# Patient Record
Sex: Female | Born: 1960
Health system: Southern US, Community
[De-identification: ages and names within clinical notes are randomized; demographics above are authoritative.]

## PROBLEM LIST (undated history)

## (undated) DIAGNOSIS — M8580 Other specified disorders of bone density and structure, unspecified site: Secondary | ICD-10-CM

## (undated) DIAGNOSIS — G2581 Restless legs syndrome: Secondary | ICD-10-CM

## (undated) DIAGNOSIS — E119 Type 2 diabetes mellitus without complications: Secondary | ICD-10-CM

## (undated) DIAGNOSIS — G7100 Muscular dystrophy, unspecified: Secondary | ICD-10-CM

## (undated) DIAGNOSIS — E785 Hyperlipidemia, unspecified: Secondary | ICD-10-CM

## (undated) DIAGNOSIS — G7109 Other specified muscular dystrophies: Secondary | ICD-10-CM

## (undated) DIAGNOSIS — A630 Anogenital (venereal) warts: Secondary | ICD-10-CM

## (undated) DIAGNOSIS — I10 Essential (primary) hypertension: Secondary | ICD-10-CM

## (undated) DIAGNOSIS — Z720 Tobacco use: Secondary | ICD-10-CM

## (undated) DIAGNOSIS — N390 Urinary tract infection, site not specified: Secondary | ICD-10-CM

## (undated) DIAGNOSIS — K219 Gastro-esophageal reflux disease without esophagitis: Secondary | ICD-10-CM

## (undated) DIAGNOSIS — J40 Bronchitis, not specified as acute or chronic: Secondary | ICD-10-CM

## (undated) DIAGNOSIS — G71039 Limb girdle muscular dystrophy, unspecified: Secondary | ICD-10-CM

## (undated) DIAGNOSIS — E039 Hypothyroidism, unspecified: Secondary | ICD-10-CM

## (undated) DIAGNOSIS — R519 Headache, unspecified: Secondary | ICD-10-CM

## (undated) DIAGNOSIS — N950 Postmenopausal bleeding: Secondary | ICD-10-CM

## (undated) DIAGNOSIS — Z780 Asymptomatic menopausal state: Secondary | ICD-10-CM

## (undated) DIAGNOSIS — M1990 Unspecified osteoarthritis, unspecified site: Secondary | ICD-10-CM

## (undated) DIAGNOSIS — E079 Disorder of thyroid, unspecified: Secondary | ICD-10-CM

## (undated) HISTORY — PX: DILATION AND CURETTAGE OF UTERUS: SHX78

## (undated) HISTORY — DX: Muscular dystrophy, unspecified: G71.00

## (undated) HISTORY — DX: Other specified disorders of bone density and structure, unspecified site: M85.80

## (undated) HISTORY — DX: Urinary tract infection, site not specified: N39.0

## (undated) HISTORY — DX: Tobacco use: Z72.0

## (undated) HISTORY — DX: Disorder of thyroid, unspecified: E07.9

## (undated) HISTORY — DX: Hyperlipidemia, unspecified: E78.5

## (undated) HISTORY — DX: Limb girdle muscular dystrophy, unspecified: G71.039

## (undated) HISTORY — DX: Essential (primary) hypertension: I10

## (undated) HISTORY — DX: Type 2 diabetes mellitus without complications: E11.9

## (undated) HISTORY — DX: Asymptomatic menopausal state: Z78.0

## (undated) HISTORY — DX: Unspecified osteoarthritis, unspecified site: M19.90

## (undated) HISTORY — PX: NO PAST SURGERIES: SHX2092

## (undated) HISTORY — DX: Other specified muscular dystrophies: G71.09

## (undated) HISTORY — DX: Anogenital (venereal) warts: A63.0

---

## 2004-04-15 ENCOUNTER — Ambulatory Visit: Payer: Self-pay | Admitting: Internal Medicine

## 2004-05-27 ENCOUNTER — Ambulatory Visit: Payer: Self-pay | Admitting: Internal Medicine

## 2005-07-22 ENCOUNTER — Ambulatory Visit: Payer: Self-pay | Admitting: Internal Medicine

## 2005-10-15 ENCOUNTER — Ambulatory Visit: Payer: Self-pay

## 2006-06-17 ENCOUNTER — Ambulatory Visit: Payer: Self-pay | Admitting: Internal Medicine

## 2009-06-10 ENCOUNTER — Ambulatory Visit: Payer: Self-pay | Admitting: Neurology

## 2009-06-20 ENCOUNTER — Encounter: Payer: Self-pay | Admitting: Neurology

## 2009-06-25 ENCOUNTER — Encounter: Payer: Self-pay | Admitting: Neurology

## 2009-10-31 ENCOUNTER — Ambulatory Visit: Payer: Self-pay | Admitting: Orthopedic Surgery

## 2014-02-27 ENCOUNTER — Ambulatory Visit: Payer: Self-pay | Admitting: Gastroenterology

## 2014-03-01 LAB — PATHOLOGY REPORT

## 2014-05-31 DIAGNOSIS — E119 Type 2 diabetes mellitus without complications: Secondary | ICD-10-CM | POA: Insufficient documentation

## 2014-05-31 DIAGNOSIS — G71039 Limb girdle muscular dystrophy, unspecified: Secondary | ICD-10-CM | POA: Insufficient documentation

## 2014-05-31 DIAGNOSIS — G7109 Other specified muscular dystrophies: Secondary | ICD-10-CM | POA: Insufficient documentation

## 2014-06-25 ENCOUNTER — Ambulatory Visit: Payer: Self-pay | Admitting: Gastroenterology

## 2014-07-19 ENCOUNTER — Emergency Department: Payer: Self-pay | Admitting: Emergency Medicine

## 2014-09-17 LAB — SURGICAL PATHOLOGY

## 2015-03-14 DIAGNOSIS — R1310 Dysphagia, unspecified: Secondary | ICD-10-CM | POA: Insufficient documentation

## 2015-03-21 ENCOUNTER — Other Ambulatory Visit: Payer: Self-pay | Admitting: Neurology

## 2015-03-21 DIAGNOSIS — G71039 Limb girdle muscular dystrophy, unspecified: Secondary | ICD-10-CM

## 2015-03-21 DIAGNOSIS — G7109 Other specified muscular dystrophies: Secondary | ICD-10-CM

## 2015-03-25 ENCOUNTER — Ambulatory Visit
Admission: RE | Admit: 2015-03-25 | Discharge: 2015-03-25 | Disposition: A | Discharge status: Home / Self Care | Payer: BLUE CROSS/BLUE SHIELD | Source: Ambulatory Visit | Attending: Neurology | Admitting: Neurology

## 2015-03-25 DIAGNOSIS — G7109 Other specified muscular dystrophies: Secondary | ICD-10-CM

## 2015-03-25 DIAGNOSIS — R1312 Dysphagia, oropharyngeal phase: Secondary | ICD-10-CM | POA: Diagnosis present

## 2015-03-25 DIAGNOSIS — G71039 Limb girdle muscular dystrophy, unspecified: Secondary | ICD-10-CM

## 2015-03-25 NOTE — Therapy (Signed)
[place], [place], [address] Phone: [phone]   Fax:     Modified Barium Swallow  Patient Details  Name: Andrea Rogers MRN: [id number] Date of Birth: 02/24/1961 Referring Provider: Gurney Maxin, MD  Encounter Date: 03/25/2015      End of Session - 03/25/15 1257    Visit Number 1   Number of Visits 1   Date for SLP Re-Evaluation 03/25/15   SLP Start Time 8921   SLP Stop Time  1941   SLP Time Calculation (min) 43 min   Activity Tolerance Patient tolerated treatment well      No past medical history on file.  No past surgical history on file.  There were no vitals filed for this visit.  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  She is able to convey her swallowing history and follow directions.  Chief complaint: Limb-girdle muscular dystrophy.  Currently reports difficulty swallowing solids.   Objective:  Radiological Procedure: A videoflouroscopic evaluation of oral-preparatory, reflex initiation, and pharyngeal phases of the swallow was performed; as well as a screening of the upper esophageal phase.  I. POSTURE: Upright in MBS chair  II. VIEW: Lateral  III. COMPENSATORY STRATEGIES: N/A  IV. BOLUSES ADMINISTERED:   Thin Liquid: 2 cup rim sips   Nectar-thick Liquid: 2 cup rim sips   Puree: 2 teaspoon boluses   Mechanical Soft: 1/4 graham cracker in applesauce  V. RESULTS OF EVALUATION: A. ORAL PREPARATORY PHASE: (The lips, tongue, and velum are observed for strength and coordination) Within normal limits       **Overall Severity Rating:  WNL  B. SWALLOW INITIATION/REFLEX: (The reflex is normal if "triggered" by the time the bolus reached the base of the tongue) Triggers while falling from the vallecular space to pyriform sinuses  **Overall Severity Rating: Mild  C. PHARYNGEAL PHASE: (Pharyngeal function is normal if the bolus shows rapid, smooth, and continuous transit through the pharynx and there is no pharyngeal residue after the swallow) Decreased tongue base retraction and hyolaryngeal movement with trace pharyngeal residue post swallow.  **Overall Severity Rating: Minimal  D. LARYNGEAL PENETRATION: (Material entering into the laryngeal inlet/vestibule but not aspirated) None  E. ASPIRATION: None  F. ESOPHAGEAL PHASE: (Screening of the upper esophagus) Mid-esophagus retention with retrograde movement below the level of the PES  ASSESSMENT: This 54 year old woman, with limb-girdle muscular dystrophy and complaints of difficulty swallowing food, is presenting with minimal oropharyngeal dysphagia characterized by delayed pharyngeal swallow initiation and decreased pharyngeal pressure generation (resulting in trace pharyngeal residue).  Oral control of the bolus including oral hold, rotary mastication, and anterior to posterior transfer is within normal limits.  There was no observed pharyngeal residue, laryngeal penetration, or tracheal aspiration.   The patient does not present with risk for prandial aspiration.  An esophageal sweep in the upright position with solid and liquid consistencies showed mid- and distal-esophageal retention with retrograde movement below the level of the PES. This study is consistent with the effects of laryngopharyngeal reflux (inflammation, edema, and resultant decreased sensation of the larynx and pharynx).  The patient was counseled to follow basic swallowing precautions including  small bites/sips, sit upright for all POs, alternate solids and liquids, and to pause in eating if she feels that her  esophagus is backing up. The patient may benefit from GI consult to determine if treatment would reduce sypmtoms.    PLAN/RECOMMENDATIONS:  A. Diet: Usual diet   B. Swallowing Precautions: small bites/sips, sit upright for all POs, alternate solids and liquids, and to pause in eating if she feels that her esophagus is backing up   C. Recommended consultation to: GI consult   D. Therapy recommendations: N/A   E. Results and recommendations were discussed with the patient immediately following the study and the final report will be routed to the referring MD      Problem List There are no active problems to display for this patient.  Leroy Sea, [place], Susie 03/25/2015, 12:59 PM  [facility] [place], [place], [address] Phone: [phone]   Fax:     Name: Andrea Rogers MRN: [id number] Date of Birth: 03-20-1961

## 2015-03-26 DIAGNOSIS — I10 Essential (primary) hypertension: Secondary | ICD-10-CM | POA: Insufficient documentation

## 2015-03-26 DIAGNOSIS — R002 Palpitations: Secondary | ICD-10-CM | POA: Insufficient documentation

## 2015-03-26 DIAGNOSIS — R600 Localized edema: Secondary | ICD-10-CM | POA: Insufficient documentation

## 2015-04-09 ENCOUNTER — Ambulatory Visit: Payer: BLUE CROSS/BLUE SHIELD | Attending: Neurology

## 2015-04-09 DIAGNOSIS — R2681 Unsteadiness on feet: Secondary | ICD-10-CM | POA: Insufficient documentation

## 2015-04-09 DIAGNOSIS — R531 Weakness: Secondary | ICD-10-CM | POA: Diagnosis present

## 2015-04-10 NOTE — Therapy (Addendum)
[place] MAIN [facility] SERVICES [address] [place], [place], [address] Phone: [phone]   Fax:  [phone]  Physical Therapy Evaluation  Patient Details  Name: Andrea Rogers MRN: [id number] Date of Birth: 06/18/1960 Referring Provider: Gurney Maxin  Encounter Date: 04/09/2015    Past Medical History  Diagnosis Date  . Muscular dystrophy, limb girdle ([place])     History reviewed. No pertinent past surgical history.  There were no vitals filed for this visit.  Visit Diagnosis:  Unsteadiness on feet - Plan: PT plan of care cert/re-cert  Weakness - Plan: PT plan of care cert/re-cert      Subjective Assessment - 04/11/15 0830    Subjective Pt reports she was referred to PT for an AFO fitting for her L LE.  pt has had three falls in the past six months and has had other falls prior to six months.  Pt is not sure of the mechanism of her falls due to sometimes falling while descending small steps, tripping over her "toes" and from static positions.  Pt has noticed she gotten weaker in the past few years and now avoids stairs.  Pt notes she does not drag her feet while ambulating nor does she require an assistive device for mobility.  Pt currently has 3/10 pain in her knees with R>L.  pt has noticed her balance is impaired.     Currently in Pain? Yes   Pain Score 3    Pain Location Knee   Pain Orientation Right;Left            [facility] PT Assessment - 04/11/15 0001    Assessment   Medical Diagnosis Muscular dystrophy   Onset Date/Surgical Date 10/12/14   Hand Dominance Right   Next MD Visit 04/29/15   Prior Therapy PT   Precautions   Precautions Fall   Restrictions   Weight Bearing Restrictions No   [place] residence   Living Arrangements Spouse/significant other   Available Help at Discharge Family   Type of [place] to enter   Entrance Stairs-Number of Steps 2   Entrance Stairs-Rails Right   [place] One level   [place] bars - toilet;Toilet riser   Prior Function   Level of Independence Independent   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 for tasks assessed   Sensation   Light Touch Appears Intact   Coordination   Gross Motor Movements are Fluid and Coordinated Yes        PAIN: 3/10 in bilateral knees  POSTURE: Upright   AROM: Pt demonstrates functional bilateral functional AROM STRENGTH:  Graded on a 0-5 scale Muscle Group Left Right  Elbow    Wrist/hand    Hip Flex 3+ 3+  Hip abduction     Hip IR/ER    Knee Flex 4+ 4+  Knee Ext 4- 4-  Ankle DF 4+ 4  Ankle dorsiflexion/eversoin  4+ 4+   SENSATION: Bilateral LEs are intact to light touch   LE myotomes/dermatomes: WNL   FUNCTIONAL MOBILITY: Pt requires UEs to perform sit to stand transfers, and her knees exhibit increase knee valgus indicating weak hip abductors   GAIT: Pt is able to ambulate around the gym without any LOB or foot drop.  She has reciprocal gait pattern and clears her feet bilaterally  OUTCOME MEASURES: TEST Outcome Interpretation  5 times sit<>stand Unable to  perform withou UE assist >50 yo, >15 sec indicates increased risk for falls  10 meter walk test              1.05   m/s <1.0 m/s indicates increased risk for falls; limited community ambulator          Berg Balance Assessment 48/56 <36/56 (100% risk for falls), 37-45 (80% risk for falls); 46-51 (>50% risk for falls); 52-55 (lower risk <25% of falls)                                 PT Long Term Goals - 04/10/15 1854    PT LONG TERM GOAL #1   Title pt expressess understanding of not requiring an AFO at this time to decrease her fall risk.     Baseline --   Time 1   Period Days   Status New               Plan - 04/10/15 1853    Clinical Impression Statement pt is a pleasant 54 year old female with limb  girdle muscular dystrophy.  Pt was referred for strength/balance training and for a AFO fitting due to prevent/decrease falls.  Based on patient's specific fall history anke strength, gait assesment and outcome measures, pt would not benefit from an AFO at this time.  Pt is able to ambulate without drop foot and can clear her feet bilaterally when ambulating.  Her falls are more likely due to due decreased and general LE weakness versus decreased ankle dorsiflexion when ambulating.  Pt demonstrates significant LE strength deficit, general balance deficits. pt is of high fall risk at this time and would benefit from skilled PT services to improve upon impairments and reduce fall risk to maximize functional mobility.  Pt refused physical therapy recommendations for follow up  due to reported time constraints  and also verbalized understanding that an AFO would likely not benefit her at this time. pt encouraged to call with any questions or concers. pt will be DC after todays visit per her request.    Pt will benefit from skilled therapeutic intervention in order to improve on the following deficits Decreased strength;Decreased balance   Rehab Potential Fair   PT Frequency 2x / week   PT Duration 4 weeks   PT Treatment/Interventions Aquatic Therapy;Therapeutic exercise;Therapeutic activities;Functional mobility training;Stair training;Balance training;Neuromuscular re-education;Manual techniques;Energy conservation         Problem List There are no active problems to display for this patient.  Renford Dills, SPT This entire session was performed under direct supervision and direction of a licensed Chiropractor . I have personally read, edited and approve of the note as written. Gorden Harms. Tortorici, PT, DPT [phone]  Tortorici,Ashley 04/11/2015, 8:30 AM  [place] MAIN [facility] SERVICES [address] [place], [place], [address] Phone: [phone]    Fax:  [phone]  Name: Andrea Rogers MRN: [id number] Date of Birth: [birth year]

## 2015-08-19 ENCOUNTER — Other Ambulatory Visit: Payer: Self-pay | Admitting: Obstetrics and Gynecology

## 2015-10-02 DIAGNOSIS — E039 Hypothyroidism, unspecified: Secondary | ICD-10-CM | POA: Insufficient documentation

## 2016-03-09 DIAGNOSIS — F411 Generalized anxiety disorder: Secondary | ICD-10-CM | POA: Insufficient documentation

## 2016-03-23 DIAGNOSIS — Z720 Tobacco use: Secondary | ICD-10-CM | POA: Insufficient documentation

## 2016-03-23 NOTE — Progress Notes (Signed)
ANNUAL PREVENTATIVE CARE GYN  ENCOUNTER NOTE  Subjective:       Andrea Rogers is a 55 y.o. G0P0000 female here for a routine annual gynecologic exam.  Current complaints: None  Mom, age 55, recently diagnosed with breast cancer. 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 Bowel and bladder function are normal. Patient continues to smoke; does not desire to quit   Gynecologic History No LMP recorded. Contraception: post menopausal status Last Pap: 07/2013 neg/neg. Results were: normal Last mammogram: 2015- [facility]. Results were: normal  Obstetric History OB History  Gravida Para Term Preterm AB Living  0 0 0 0 0 0  SAB TAB Ectopic Multiple Live Births  0 0 0 0 0        Past Medical History:  Diagnosis Date  . Anal condyloma   . Arthritis   . Diabetes mellitus without complication ([place])   . HLD (hyperlipidemia)   . Hypertension   . MD (muscular dystrophy) ([place])   . Menopause   . Muscular dystrophy, limb girdle ([place])   . Osteopenia   . Thyroid disease   . Tobacco abuse     Past Surgical History:  Procedure Laterality Date  . NO PAST SURGERIES      Current Outpatient Prescriptions on File Prior to Visit  Medication Sig Dispense Refill  . HYDROcodone-acetaminophen (NORCO) 7.5-325 MG tablet Take 1 tablet by mouth every 6 (six) hours as needed for moderate pain.     No current facility-administered medications on file prior to visit.     Allergies  Allergen Reactions  . Latex   . Penicillins Other (See Comments)    Yeast infection    Social History   Social History  . Marital status: Married    Spouse name: N/A  . Number of children: N/A  . Years of education: N/A   Occupational History  . Not on file.   Social History Main Topics  . Smoking status: Current Every Day Smoker    Packs/day: 1.00  . Smokeless tobacco: Never Used  . Alcohol use No  . Drug use: No  . Sexual activity: Yes     Birth control/ protection: Post-menopausal   Other Topics Concern  . Not on file   Social History Narrative  . No narrative on file    Family History  Problem Relation Age of Onset  . Adopted: Yes  . Family history unknown: Yes    The following portions of the patient's history were reviewed and updated as appropriate: allergies, current medications, past family history, past medical history, past social history, past surgical history and problem list.  Review of Systems ROS Review of Systems - General ROS: negative for - chills, fatigue, fever, hot flashes, night sweats, weight gain or weight loss Psychological ROS: negative for - anxiety, decreased libido, depression, mood swings, physical abuse or sexual abuse Ophthalmic ROS: negative for - blurry vision, eye pain or loss of vision ENT ROS: negative for - headaches, hearing change, visual changes or vocal changes Allergy and Immunology ROS: negative for - hives, itchy/watery eyes or seasonal allergies Hematological and Lymphatic ROS: negative for - bleeding problems, bruising, swollen lymph nodes or weight loss Endocrine ROS: negative for - galactorrhea, hair pattern changes, hot flashes, malaise/lethargy, mood swings, palpitations, polydipsia/polyuria, skin changes, temperature intolerance or unexpected weight changes Breast ROS: negative for - new or changing breast lumps or nipple discharge Respiratory ROS: negative for - cough or shortness  of breath Cardiovascular ROS: negative for - chest pain, irregular heartbeat, palpitations or shortness of breath Gastrointestinal ROS: no abdominal pain, change in bowel habits, or black or bloody stools Genito-Urinary ROS: no dysuria, trouble voiding, or hematuria Musculoskeletal ROS: negative for - joint pain or joint stiffness Neurological ROS: negative for - bowel and bladder control changes Dermatological ROS: negative for rash and skin lesion changes   Objective:   BP 111/69    Pulse 85   Ht 5\' 4"  (1.626 m)   Wt 144 lb 4.8 oz (65.5 kg)   BMI 24.77 kg/m  CONSTITUTIONAL: Well-developed, well-nourished female in no acute distress.  PSYCHIATRIC: Normal mood and affect. Normal behavior. Normal judgment and thought content. [place]: Alert and oriented to person, place, and time. Normal muscle tone coordination. No cranial nerve deficit noted. HENT:  Normocephalic, atraumatic, External right and left ear normal. Oropharynx is clear and moist EYES: Conjunctivae and EOM are normal. Pupils are equal, round, and reactive to light. No scleral icterus.  NECK: Normal range of motion, supple, no masses.  Normal thyroid.  SKIN: Skin is warm and dry. No rash noted. Not diaphoretic. No erythema. No pallor. CARDIOVASCULAR: Normal heart rate noted, regular rhythm, no murmur. RESPIRATORY: Clear to auscultation bilaterally. Effort and breath sounds normal, no problems with respiration noted. BREASTS: Symmetric in size. No masses, skin changes, nipple drainage, or lymphadenopathy. ABDOMEN: Soft, normal bowel sounds, no distention noted.  No tenderness, rebound or guarding.  BLADDER: Normal PELVIC:  External Genitalia: Normal  BUS: Normal  Vagina: Normal  Cervix: Normal; no lesions  Uterus: Normal; midplane, normal size and shape, mobile, nontender  Adnexa: Normal; nonpalpable and nontender  RV: External Exam NormaI, No Rectal Masses and Normal Sphincter tone  MUSCULOSKELETAL: Normal range of motion. No tenderness.  No cyanosis, clubbing, or edema.  2+ distal pulses. LYMPHATIC: No Axillary, Supraclavicular, or Inguinal Adenopathy.    Assessment:   Annual gynecologic examination 55 y.o. Contraception: post menopausal status Normal BMI Problem List Items Addressed This Visit    Tobacco user    Other Visit Diagnoses    Well woman exam with routine gynecological exam    -  Primary   Screening for breast cancer       Screen for colon cancer       Osteopenia, unspecified  location          Plan:  Pap: due 2018 Mammogram: Ordered Stool Guaiac Testing:  Ordered Labs: pcp Routine preventative health maintenance measures emphasized: Exercise/Diet/Weight control, Tobacco Warnings and Alcohol/Substance use risks Return to [place], CMA  Brayton Mars, MD  Note: This dictation was prepared with Dragon dictation along with smaller phrase technology. Any transcriptional errors that result from this process are unintentional.

## 2016-03-25 ENCOUNTER — Encounter: Payer: Self-pay | Admitting: Obstetrics and Gynecology

## 2016-03-25 ENCOUNTER — Ambulatory Visit (INDEPENDENT_AMBULATORY_CARE_PROVIDER_SITE_OTHER): Payer: BLUE CROSS/BLUE SHIELD | Admitting: Obstetrics and Gynecology

## 2016-03-25 VITALS — BP 111/69 | HR 85 | Ht 64.0 in | Wt 144.3 lb

## 2016-03-25 DIAGNOSIS — Z1211 Encounter for screening for malignant neoplasm of colon: Secondary | ICD-10-CM | POA: Diagnosis not present

## 2016-03-25 DIAGNOSIS — Z1231 Encounter for screening mammogram for malignant neoplasm of breast: Secondary | ICD-10-CM | POA: Diagnosis not present

## 2016-03-25 DIAGNOSIS — Z01419 Encounter for gynecological examination (general) (routine) without abnormal findings: Secondary | ICD-10-CM

## 2016-03-25 DIAGNOSIS — Z720 Tobacco use: Secondary | ICD-10-CM

## 2016-03-25 DIAGNOSIS — M8580 Other specified disorders of bone density and structure, unspecified site: Secondary | ICD-10-CM | POA: Diagnosis not present

## 2016-03-25 DIAGNOSIS — Z1239 Encounter for other screening for malignant neoplasm of breast: Secondary | ICD-10-CM

## 2016-03-25 NOTE — Patient Instructions (Signed)
Health Maintenance, Female Adopting a healthy lifestyle and getting preventive care can go a long way to promote health and wellness. Talk with your health care provider about what schedule of regular examinations is right for you. This is a good chance for you to check in with your provider about disease prevention and staying healthy. In between checkups, there are plenty of things you can do on your own. Experts have done a lot of research about which lifestyle changes and preventive measures are most likely to keep you healthy. Ask your health care provider for more information. WEIGHT AND DIET  Eat a healthy diet  Be sure to include plenty of vegetables, fruits, low-fat dairy products, and lean protein.  Do not eat a lot of foods high in solid fats, added sugars, or salt.  Get regular exercise. This is one of the most important things you can do for your health.  Most adults should exercise for at least 150 minutes each week. The exercise should increase your heart rate and make you sweat (moderate-intensity exercise).  Most adults should also do strengthening exercises at least twice a week. This is in addition to the moderate-intensity exercise.  Maintain a healthy weight  Body mass index (BMI) is a measurement that can be used to identify possible weight problems. It estimates body fat based on height and weight. Your health care provider can help determine your BMI and help you achieve or maintain a healthy weight.  For females 20 years of age and older:   A BMI below 18.5 is considered underweight.  A BMI of 18.5 to 24.9 is normal.  A BMI of 25 to 29.9 is considered overweight.  A BMI of 30 and above is considered obese.  Watch levels of cholesterol and blood lipids  You should start having your blood tested for lipids and cholesterol at 55 years of age, then have this test every 5 years.  You may need to have your cholesterol levels checked more often if:  Your lipid  or cholesterol levels are high.  You are older than 55 years of age.  You are at high risk for heart disease.  CANCER SCREENING   Lung Cancer  Lung cancer screening is recommended for adults 55-80 years old who are at high risk for lung cancer because of a history of smoking.  A yearly low-dose CT scan of the lungs is recommended for people who:  Currently smoke.  Have quit within the past 15 years.  Have at least a 30-pack-year history of smoking. A pack year is smoking an average of one pack of cigarettes a day for 1 year.  Yearly screening should continue until it has been 15 years since you quit.  Yearly screening should stop if you develop a health problem that would prevent you from having lung cancer treatment.  Breast Cancer  Practice breast self-awareness. This means understanding how your breasts normally appear and feel.  It also means doing regular breast self-exams. Let your health care provider know about any changes, no matter how small.  If you are in your 20s or 30s, you should have a clinical breast exam (CBE) by a health care provider every 1-3 years as part of a regular health exam.  If you are 40 or older, have a CBE every year. Also consider having a breast X-ray (mammogram) every year.  If you have a family history of breast cancer, talk to your health care provider about genetic screening.  If you   are at high risk for breast cancer, talk to your health care provider about having an MRI and a mammogram every year.  Breast cancer gene (BRCA) assessment is recommended for women who have family members with BRCA-related cancers. BRCA-related cancers include:  Breast.  Ovarian.  Tubal.  Peritoneal cancers.  Results of the assessment will determine the need for genetic counseling and BRCA1 and BRCA2 testing. Cervical Cancer Your health care provider may recommend that you be screened regularly for cancer of the pelvic organs (ovaries, uterus, and  vagina). This screening involves a pelvic examination, including checking for microscopic changes to the surface of your cervix (Pap test). You may be encouraged to have this screening done every 3 years, beginning at age 21.  For women ages 30-65, health care providers may recommend pelvic exams and Pap testing every 3 years, or they may recommend the Pap and pelvic exam, combined with testing for human papilloma virus (HPV), every 5 years. Some types of HPV increase your risk of cervical cancer. Testing for HPV may also be done on women of any age with unclear Pap test results.  Other health care providers may not recommend any screening for nonpregnant women who are considered low risk for pelvic cancer and who do not have symptoms. Ask your health care provider if a screening pelvic exam is right for you.  If you have had past treatment for cervical cancer or a condition that could lead to cancer, you need Pap tests and screening for cancer for at least 20 years after your treatment. If Pap tests have been discontinued, your risk factors (such as having a new sexual partner) need to be reassessed to determine if screening should resume. Some women have medical problems that increase the chance of getting cervical cancer. In these cases, your health care provider may recommend more frequent screening and Pap tests. Colorectal Cancer  This type of cancer can be detected and often prevented.  Routine colorectal cancer screening usually begins at 55 years of age and continues through 55 years of age.  Your health care provider may recommend screening at an earlier age if you have risk factors for colon cancer.  Your health care provider may also recommend using home test kits to check for hidden blood in the stool.  A small camera at the end of a tube can be used to examine your colon directly (sigmoidoscopy or colonoscopy). This is done to check for the earliest forms of colorectal  cancer.  Routine screening usually begins at age 50.  Direct examination of the colon should be repeated every 5-10 years through 55 years of age. However, you may need to be screened more often if early forms of precancerous polyps or small growths are found. Skin Cancer  Check your skin from head to toe regularly.  Tell your health care provider about any new moles or changes in moles, especially if there is a change in a mole's shape or color.  Also tell your health care provider if you have a mole that is larger than the size of a pencil eraser.  Always use sunscreen. Apply sunscreen liberally and repeatedly throughout the day.  Protect yourself by wearing long sleeves, pants, a wide-brimmed hat, and sunglasses whenever you are outside. HEART DISEASE, DIABETES, AND HIGH BLOOD PRESSURE   High blood pressure causes heart disease and increases the risk of stroke. High blood pressure is more likely to develop in:  People who have blood pressure in the high end   of the normal range (130-139/85-89 mm Hg).  People who are overweight or obese.  People who are African American.  If you are 38-23 years of age, have your blood pressure checked every 3-5 years. If you are 61 years of age or older, have your blood pressure checked every year. You should have your blood pressure measured twice--once when you are at a hospital or clinic, and once when you are not at a hospital or clinic. Record the average of the two measurements. To check your blood pressure when you are not at a hospital or clinic, you can use:  An automated blood pressure machine at a pharmacy.  A home blood pressure monitor.  If you are between 45 years and 39 years old, ask your health care provider if you should take aspirin to prevent strokes.  Have regular diabetes screenings. This involves taking a blood sample to check your fasting blood sugar level.  If you are at a normal weight and have a low risk for diabetes,  have this test once every three years after 55 years of age.  If you are overweight and have a high risk for diabetes, consider being tested at a younger age or more often. PREVENTING INFECTION  Hepatitis B  If you have a higher risk for hepatitis B, you should be screened for this virus. You are considered at high risk for hepatitis B if:  You were born in a country where hepatitis B is common. Ask your health care provider which countries are considered high risk.  Your parents were born in a high-risk country, and you have not been immunized against hepatitis B (hepatitis B vaccine).  You have HIV or AIDS.  You use needles to inject street drugs.  You live with someone who has hepatitis B.  You have had sex with someone who has hepatitis B.  You get hemodialysis treatment.  You take certain medicines for conditions, including cancer, organ transplantation, and autoimmune conditions. Hepatitis C  Blood testing is recommended for:  Everyone born from [birth year] through 1965.  Anyone with known risk factors for hepatitis C. Sexually transmitted infections (STIs)  You should be screened for sexually transmitted infections (STIs) including gonorrhea and chlamydia if:  You are sexually active and are younger than 55 years of age.  You are older than 55 years of age and your health care provider tells you that you are at risk for this type of infection.  Your sexual activity has changed since you were last screened and you are at an increased risk for chlamydia or gonorrhea. Ask your health care provider if you are at risk.  If you do not have HIV, but are at risk, it may be recommended that you take a prescription medicine daily to prevent HIV infection. This is called pre-exposure prophylaxis (PrEP). You are considered at risk if:  You are sexually active and do not regularly use condoms or know the HIV status of your partner(s).  You take drugs by injection.  You are sexually  active with a partner who has HIV. Talk with your health care provider about whether you are at high risk of being infected with HIV. If you choose to begin PrEP, you should first be tested for HIV. You should then be tested every 3 months for as long as you are taking PrEP.  PREGNANCY   If you are premenopausal and you may become pregnant, ask your health care provider about preconception counseling.  If you may  become pregnant, take 400 to 800 micrograms (mcg) of folic acid every day.  If you want to prevent pregnancy, talk to your health care provider about birth control (contraception). OSTEOPOROSIS AND MENOPAUSE   Osteoporosis is a disease in which the bones lose minerals and strength with aging. This can result in serious bone fractures. Your risk for osteoporosis can be identified using a bone density scan.  If you are 73 years of age or older, or if you are at risk for osteoporosis and fractures, ask your health care provider if you should be screened.  Ask your health care provider whether you should take a calcium or vitamin D supplement to lower your risk for osteoporosis.  Menopause may have certain physical symptoms and risks.  Hormone replacement therapy may reduce some of these symptoms and risks. Talk to your health care provider about whether hormone replacement therapy is right for you.  HOME CARE INSTRUCTIONS   Schedule regular health, dental, and eye exams.  Stay current with your immunizations.   Do not use any tobacco products including cigarettes, chewing tobacco, or electronic cigarettes.  If you are pregnant, do not drink alcohol.  If you are breastfeeding, limit how much and how often you drink alcohol.  Limit alcohol intake to no more than 1 drink per day for nonpregnant women. One drink equals 12 ounces of beer, 5 ounces of wine, or 1 ounces of hard liquor.  Do not use street drugs.  Do not share needles.  Ask your health care provider for help if  you need support or information about quitting drugs.  Tell your health care provider if you often feel depressed.  Tell your health care provider if you have ever been abused or do not feel safe at home.   This information is not intended to replace advice given to you by your health care provider. Make sure you discuss any questions you have with your health care provider.   Document Released: 11/24/2010 Document Revised: 06/01/2014 Document Reviewed: 04/12/2013 Elsevier Interactive Patient Education 2016 [place].   1. No Pap smear until 2018 2. Mammogram ordered 3. Stool guaiac cards given for colon cancer screening 4. Screening labs for primary care 5. Continue with healthy eating and exercise 6. SMOKING CESSATION 7. Return in 1 year

## 2016-03-31 DIAGNOSIS — R079 Chest pain, unspecified: Secondary | ICD-10-CM | POA: Insufficient documentation

## 2016-07-03 DIAGNOSIS — I10 Essential (primary) hypertension: Secondary | ICD-10-CM | POA: Diagnosis not present

## 2016-07-03 DIAGNOSIS — E119 Type 2 diabetes mellitus without complications: Secondary | ICD-10-CM | POA: Diagnosis not present

## 2016-07-03 DIAGNOSIS — Z720 Tobacco use: Secondary | ICD-10-CM | POA: Diagnosis not present

## 2016-07-03 DIAGNOSIS — G710 Muscular dystrophy: Secondary | ICD-10-CM | POA: Diagnosis not present

## 2016-07-10 DIAGNOSIS — I10 Essential (primary) hypertension: Secondary | ICD-10-CM | POA: Diagnosis not present

## 2016-07-10 DIAGNOSIS — E1121 Type 2 diabetes mellitus with diabetic nephropathy: Secondary | ICD-10-CM | POA: Diagnosis not present

## 2016-07-10 DIAGNOSIS — G710 Muscular dystrophy: Secondary | ICD-10-CM | POA: Diagnosis not present

## 2016-07-10 DIAGNOSIS — E039 Hypothyroidism, unspecified: Secondary | ICD-10-CM | POA: Diagnosis not present

## 2016-08-04 DIAGNOSIS — G710 Muscular dystrophy: Secondary | ICD-10-CM | POA: Diagnosis not present

## 2016-08-04 DIAGNOSIS — E119 Type 2 diabetes mellitus without complications: Secondary | ICD-10-CM | POA: Diagnosis not present

## 2016-08-04 DIAGNOSIS — R1319 Other dysphagia: Secondary | ICD-10-CM | POA: Diagnosis not present

## 2016-08-04 DIAGNOSIS — Z720 Tobacco use: Secondary | ICD-10-CM | POA: Diagnosis not present

## 2016-09-21 DIAGNOSIS — W57XXXA Bitten or stung by nonvenomous insect and other nonvenomous arthropods, initial encounter: Secondary | ICD-10-CM | POA: Diagnosis not present

## 2016-09-21 DIAGNOSIS — S80861A Insect bite (nonvenomous), right lower leg, initial encounter: Secondary | ICD-10-CM | POA: Diagnosis not present

## 2016-09-21 DIAGNOSIS — L02415 Cutaneous abscess of right lower limb: Secondary | ICD-10-CM | POA: Diagnosis not present

## 2016-09-24 DIAGNOSIS — L02415 Cutaneous abscess of right lower limb: Secondary | ICD-10-CM | POA: Diagnosis not present

## 2016-10-27 DIAGNOSIS — E039 Hypothyroidism, unspecified: Secondary | ICD-10-CM | POA: Diagnosis not present

## 2016-10-27 DIAGNOSIS — I10 Essential (primary) hypertension: Secondary | ICD-10-CM | POA: Diagnosis not present

## 2016-10-27 DIAGNOSIS — G710 Muscular dystrophy: Secondary | ICD-10-CM | POA: Diagnosis not present

## 2016-10-27 DIAGNOSIS — E1121 Type 2 diabetes mellitus with diabetic nephropathy: Secondary | ICD-10-CM | POA: Diagnosis not present

## 2016-11-12 DIAGNOSIS — E785 Hyperlipidemia, unspecified: Secondary | ICD-10-CM | POA: Insufficient documentation

## 2016-11-12 DIAGNOSIS — E119 Type 2 diabetes mellitus without complications: Secondary | ICD-10-CM | POA: Diagnosis not present

## 2016-11-12 DIAGNOSIS — Z720 Tobacco use: Secondary | ICD-10-CM | POA: Diagnosis not present

## 2016-11-12 DIAGNOSIS — I10 Essential (primary) hypertension: Secondary | ICD-10-CM | POA: Diagnosis not present

## 2016-11-12 DIAGNOSIS — Z794 Long term (current) use of insulin: Secondary | ICD-10-CM | POA: Diagnosis not present

## 2016-12-10 DIAGNOSIS — G710 Muscular dystrophy: Secondary | ICD-10-CM | POA: Diagnosis not present

## 2016-12-10 DIAGNOSIS — Z0000 Encounter for general adult medical examination without abnormal findings: Secondary | ICD-10-CM | POA: Diagnosis not present

## 2016-12-10 DIAGNOSIS — E119 Type 2 diabetes mellitus without complications: Secondary | ICD-10-CM | POA: Diagnosis not present

## 2016-12-10 DIAGNOSIS — E039 Hypothyroidism, unspecified: Secondary | ICD-10-CM | POA: Diagnosis not present

## 2017-03-29 NOTE — Progress Notes (Deleted)
ANNUAL PREVENTATIVE CARE GYN  ENCOUNTER NOTE  Subjective:       Andrea Rogers is a 56 y.o. G0P0000 female here for a routine annual gynecologic exam.  Current complaints: None  Andrea Rogers, age 38, recently diagnosed with breast cancer. 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 Bowel and bladder function are normal. Patient continues to smoke; does not desire to quit   Gynecologic History No LMP recorded. Patient is postmenopausal. Contraception: post menopausal status Last Pap: 07/2013 neg/neg. Results were: normal Last mammogram: 2015- [facility]. Results were: normal  Obstetric History OB History  Gravida Para Term Preterm AB Living  0 0 0 0 0 0  SAB TAB Ectopic Multiple Live Births  0 0 0 0 0        Past Medical History:  Diagnosis Date  . Anal condyloma   . Arthritis   . Diabetes mellitus without complication ([place])   . HLD (hyperlipidemia)   . Hypertension   . MD (muscular dystrophy) ([place])   . Menopause   . Muscular dystrophy, limb girdle ([place])   . Osteopenia   . Recurrent UTI   . Thyroid disease   . Tobacco abuse     Past Surgical History:  Procedure Laterality Date  . NO PAST SURGERIES      Current Outpatient Medications on File Prior to Visit  Medication Sig Dispense Refill  . amitriptyline (ELAVIL) 100 MG tablet TAKE 1 TABLET BY MOUTH EVERY DAY AT BEDTIME    . atenolol (TENORMIN) 100 MG tablet Take by mouth.    . cyclobenzaprine (FLEXERIL) 10 MG tablet 1-2  tab po qd prn    . ezetimibe (ZETIA) 10 MG tablet TAKE 1 TABLET (10 MG TOTAL) BY MOUTH ONCE DAILY.    Marland Kitchen HYDROcodone-acetaminophen (NORCO) 7.5-325 MG tablet Take 1 tablet by mouth every 6 (six) hours as needed for moderate pain.    Marland Kitchen levothyroxine (SYNTHROID, LEVOTHROID) 75 MCG tablet TAKE 1 TABLET BY MOUTH EVERY DAY    . lisinopril (PRINIVIL,ZESTRIL) 5 MG tablet TAKE 1 TABLET (5 MG TOTAL) BY MOUTH ONCE DAILY.    . metFORMIN (GLUCOPHAGE) 500 MG  tablet TAKE 2 TABLET BY MOUTH TWICE A DAY AS DIRECTED    . triamterene-hydrochlorothiazide (MAXZIDE) 75-50 MG tablet TAKE 1 TABLET BY MOUTH ONCE DAILY.    Marland Kitchen Vitamin D, Ergocalciferol, (DRISDOL) 50000 units CAPS capsule TAKE 1 CAPSULE (50,000 UNITS TOTAL) BY MOUTH ONCE A WEEK.     No current facility-administered medications on file prior to visit.     Allergies  Allergen Reactions  . Latex   . Penicillins Other (See Comments)    Yeast infection    Social History   Socioeconomic History  . Marital status: Married    Spouse name: Not on file  . Number of children: Not on file  . Years of education: Not on file  . Highest education level: Not on file  Social Needs  . Financial resource strain: Not on file  . Food insecurity - worry: Not on file  . Food insecurity - inability: Not on file  . Transportation needs - medical: Not on file  . Transportation needs - non-medical: Not on file  Occupational History  . Not on file  Tobacco Use  . Smoking status: Current Every Day Smoker    Packs/day: 1.00  . Smokeless tobacco: Never Used  Substance and Sexual Activity  . Alcohol use: No  . Drug use: No  .  Sexual activity: Yes    Birth control/protection: Post-menopausal  Other Topics Concern  . Not on file  Social History Narrative  . Not on file    Family History  Adopted: Yes  Family history unknown: Yes    The following portions of the patient's history were reviewed and updated as appropriate: allergies, current medications, past family history, past medical history, past social history, past surgical history and problem list.  Review of Systems ROS   Objective:   There were no vitals taken for this visit. CONSTITUTIONAL: Well-developed, well-nourished female in no acute distress.  PSYCHIATRIC: Normal mood and affect. Normal behavior. Normal judgment and thought content. Andrea Rogers: Alert and oriented to person, place, and time. Normal muscle tone coordination. No  cranial nerve deficit noted. HENT:  Normocephalic, atraumatic, External right and left ear normal. Oropharynx is clear and moist EYES: Conjunctivae and EOM are normal. Pupils are equal, round, and reactive to light. No scleral icterus.  NECK: Normal range of motion, supple, no masses.  Normal thyroid.  SKIN: Skin is warm and dry. No rash noted. Not diaphoretic. No erythema. No pallor. CARDIOVASCULAR: Normal heart rate noted, regular rhythm, no murmur. RESPIRATORY: Clear to auscultation bilaterally. Effort and breath sounds normal, no problems with respiration noted. BREASTS: Symmetric in size. No masses, skin changes, nipple drainage, or lymphadenopathy. ABDOMEN: Soft, normal bowel sounds, no distention noted.  No tenderness, rebound or guarding.  BLADDER: Normal PELVIC:  External Genitalia: Normal  BUS: Normal  Vagina: Normal  Cervix: Normal; no lesions  Uterus: Normal; midplane, normal size and shape, mobile, nontender  Adnexa: Normal; nonpalpable and nontender  RV: External Exam NormaI, No Rectal Masses and Normal Sphincter tone  MUSCULOSKELETAL: Normal range of motion. No tenderness.  No cyanosis, clubbing, or edema.  2+ distal pulses. LYMPHATIC: No Axillary, Supraclavicular, or Inguinal Adenopathy.    Assessment:   Annual gynecologic examination 56 y.o. Contraception: post menopausal status Normal BMI Problem List Items Addressed This Visit    None    Visit Diagnoses    Well woman exam with routine gynecological exam    -  Primary   Screening for breast cancer       Screen for colon cancer       Osteopenia, unspecified location       Tobacco user          Plan:  Pap: Pap/hpv Mammogram: Ordered Stool Guaiac Testing:  Ordered Labs: pcp Routine preventative health maintenance measures emphasized: Exercise/Diet/Weight control, Tobacco Warnings and Alcohol/Substance use risks Return to [place], [place]   Note: This dictation was prepared with  Dragon dictation along with smaller phrase technology. Any transcriptional errors that result from this process are unintentional.

## 2017-03-30 ENCOUNTER — Encounter: Payer: BLUE CROSS/BLUE SHIELD | Admitting: Obstetrics and Gynecology

## 2017-03-31 ENCOUNTER — Encounter: Payer: BLUE CROSS/BLUE SHIELD | Admitting: Obstetrics and Gynecology

## 2017-04-07 DIAGNOSIS — R8290 Unspecified abnormal findings in urine: Secondary | ICD-10-CM | POA: Diagnosis not present

## 2017-04-07 DIAGNOSIS — G7109 Other specified muscular dystrophies: Secondary | ICD-10-CM | POA: Diagnosis not present

## 2017-04-07 DIAGNOSIS — E039 Hypothyroidism, unspecified: Secondary | ICD-10-CM | POA: Diagnosis not present

## 2017-04-07 DIAGNOSIS — Z0000 Encounter for general adult medical examination without abnormal findings: Secondary | ICD-10-CM | POA: Diagnosis not present

## 2017-04-07 DIAGNOSIS — E119 Type 2 diabetes mellitus without complications: Secondary | ICD-10-CM | POA: Diagnosis not present

## 2017-04-12 NOTE — Progress Notes (Signed)
ANNUAL PREVENTATIVE CARE GYN  ENCOUNTER NOTE  Subjective:       Andrea Rogers is a 56 y.o. G0P0000 female here for a routine annual gynecologic exam.  Current complaints:  1. None  No new interval health issues. Muscular dystrophy is stable. Patient reports no significant bowel or bladder dysfunction. No significant vasomotor symptoms.  Patient recently found birth parents. Patient's stepmom is struggling with stage III breast cancer; stopped therapy; hospice involved    Gynecologic History No LMP recorded. Patient is postmenopausal. Contraception: post menopausal status Last Pap: 07/2013 neg/neg. Results were: normal Last mammogram: 2017- [facility] wnl. Results were: normal  Obstetric History OB History  Gravida Para Term Preterm AB Living  0 0 0 0 0 0  SAB TAB Ectopic Multiple Live Births  0 0 0 0 0        Past Medical History:  Diagnosis Date  . Anal condyloma   . Arthritis   . Diabetes mellitus without complication ([place])   . HLD (hyperlipidemia)   . Hypertension   . MD (muscular dystrophy) ([place])   . Menopause   . Muscular dystrophy, limb girdle ([place])   . Osteopenia   . Recurrent UTI   . Thyroid disease   . Tobacco abuse     Past Surgical History:  Procedure Laterality Date  . NO PAST SURGERIES      Current Outpatient Medications on File Prior to Visit  Medication Sig Dispense Refill  . amitriptyline (ELAVIL) 100 MG tablet TAKE 1 TABLET BY MOUTH EVERY DAY AT BEDTIME    . atenolol (TENORMIN) 100 MG tablet Take by mouth.    . cyclobenzaprine (FLEXERIL) 10 MG tablet 1-2  tab po qd prn    . ezetimibe (ZETIA) 10 MG tablet TAKE 1 TABLET (10 MG TOTAL) BY MOUTH ONCE DAILY.    Marland Kitchen HYDROcodone-acetaminophen (NORCO) 7.5-325 MG tablet Take 1 tablet by mouth every 6 (six) hours as needed for moderate pain.    Marland Kitchen levothyroxine (SYNTHROID, LEVOTHROID) 75 MCG tablet TAKE 1 TABLET BY MOUTH EVERY DAY    . lisinopril (PRINIVIL,ZESTRIL) 5 MG tablet TAKE 1 TABLET (5 MG TOTAL) BY MOUTH  ONCE DAILY.    . metFORMIN (GLUCOPHAGE) 500 MG tablet TAKE 2 TABLET BY MOUTH TWICE A DAY AS DIRECTED    . triamterene-hydrochlorothiazide (MAXZIDE) 75-50 MG tablet TAKE 1 TABLET BY MOUTH ONCE DAILY.    Marland Kitchen Vitamin D, Ergocalciferol, (DRISDOL) 50000 units CAPS capsule TAKE 1 CAPSULE (50,000 UNITS TOTAL) BY MOUTH ONCE A WEEK.     No current facility-administered medications on file prior to visit.     Allergies  Allergen Reactions  . Latex   . Penicillins Other (See Comments)    Yeast infection    Social History   Socioeconomic History  . Marital status: Married    Spouse name: Not on file  . Number of children: Not on file  . Years of education: Not on file  . Highest education level: Not on file  Social Needs  . Financial resource strain: Not on file  . Food insecurity - worry: Not on file  . Food insecurity - inability: Not on file  . Transportation needs - medical: Not on file  . Transportation needs - non-medical: Not on file  Occupational History  . Not on file  Tobacco Use  . Smoking status: Current Every Day Smoker    Packs/day: 1.00  . Smokeless tobacco: Never Used  Substance and Sexual Activity  . Alcohol use: No  .  Drug use: No  . Sexual activity: Yes    Birth control/protection: Post-menopausal  Other Topics Concern  . Not on file  Social History Narrative  . Not on file    Family History  Adopted: Yes  Family history unknown: Yes    The following portions of the patient's history were reviewed and updated as appropriate: allergies, current medications, past family history, past medical history, past social history, past surgical history and problem list.  Review of Systems Review of Systems  Constitutional: Negative.   HENT: Negative.   Eyes: Negative.   Respiratory: Negative.   Cardiovascular: Negative.   Gastrointestinal: Negative.   Genitourinary: Negative.   Musculoskeletal:       Muscular dystrophy, stable  Skin: Negative.   Neurological:  Negative.   Endo/Heme/Allergies: Negative.   Psychiatric/Behavioral: Negative.      Objective:   BP 131/76   Pulse 81   Ht 5\' 4"  (1.626 m)   Wt 145 lb 4.8 oz (65.9 kg)   BMI 24.94 kg/m  CONSTITUTIONAL: Well-developed, well-nourished female in no acute distress.  PSYCHIATRIC: Normal mood and affect. Normal behavior. Normal judgment and thought content. [place]: Alert and oriented to person, place, and time. Normal muscle tone coordination. No cranial nerve deficit noted. HENT:  Normocephalic, atraumatic, External right and left ear normal. Oropharynx is clear and moist EYES: Conjunctivae and EOM are normal. No scleral icterus.  NECK: Normal range of motion, supple, no masses.  Normal thyroid.  SKIN: Skin is warm and dry. No rash noted. Not diaphoretic. No erythema. No pallor. CARDIOVASCULAR: Normal heart rate noted, regular rhythm, no murmur. RESPIRATORY: Clear to auscultation bilaterally. Effort and breath sounds normal, no problems with respiration noted. BREASTS: Symmetric in size. No masses, skin changes, nipple drainage, or lymphadenopathy. ABDOMEN: Soft, normal bowel sounds, no distention noted.  No tenderness, rebound or guarding.  BLADDER: Normal PELVIC:  External Genitalia: Normal  BUS: Normal  Vagina: Mild atrophic changes; normal secretions  Cervix: Normal; no lesions; no cervical motion tenderness  Uterus: Normal; midplane, normal size and shape, mobile, nontender  Adnexa: Normal; nonpalpable and nontender  RV: External Exam NormaI, No Rectal Masses and Normal Sphincter tone  MUSCULOSKELETAL: Normal range of motion. No tenderness.  No cyanosis, clubbing, or edema.  2+ distal pulses. LYMPHATIC: No Axillary, Supraclavicular, or Inguinal Adenopathy.    Assessment:   Annual gynecologic examination 56 y.o. Contraception: post menopausal status Normal BMI Problem List Items Addressed This Visit    None    Visit Diagnoses    Well woman exam with routine  gynecological exam    -  Primary   Screening for breast cancer       Screen for colon cancer       Osteopenia, unspecified location       Tobacco user        Muscular dystrophy, stable  Plan:  Pap: Pap/hpv Mammogram: Ordered Stool Guaiac Testing:  Ordered Labs: pcp Routine preventative health maintenance measures emphasized: Exercise/Diet/Weight control, Tobacco Warnings and Alcohol/Substance use risks  Calcium with vitamin D supplementation twice a day encouraged Return to [place], CMA  Brayton Mars, MD  Note: This dictation was prepared with Dragon dictation along with smaller phrase technology. Any transcriptional errors that result from this process are unintentional.

## 2017-04-14 ENCOUNTER — Encounter: Payer: Self-pay | Admitting: Obstetrics and Gynecology

## 2017-04-14 ENCOUNTER — Ambulatory Visit (INDEPENDENT_AMBULATORY_CARE_PROVIDER_SITE_OTHER): Payer: BLUE CROSS/BLUE SHIELD | Admitting: Obstetrics and Gynecology

## 2017-04-14 VITALS — BP 131/76 | HR 81 | Ht 64.0 in | Wt 145.3 lb

## 2017-04-14 DIAGNOSIS — Z1231 Encounter for screening mammogram for malignant neoplasm of breast: Secondary | ICD-10-CM

## 2017-04-14 DIAGNOSIS — M8580 Other specified disorders of bone density and structure, unspecified site: Secondary | ICD-10-CM | POA: Diagnosis not present

## 2017-04-14 DIAGNOSIS — Z720 Tobacco use: Secondary | ICD-10-CM

## 2017-04-14 DIAGNOSIS — Z01419 Encounter for gynecological examination (general) (routine) without abnormal findings: Secondary | ICD-10-CM | POA: Diagnosis not present

## 2017-04-14 DIAGNOSIS — Z0000 Encounter for general adult medical examination without abnormal findings: Secondary | ICD-10-CM | POA: Diagnosis not present

## 2017-04-14 DIAGNOSIS — G7109 Other specified muscular dystrophies: Secondary | ICD-10-CM | POA: Diagnosis not present

## 2017-04-14 DIAGNOSIS — Z1239 Encounter for other screening for malignant neoplasm of breast: Secondary | ICD-10-CM

## 2017-04-14 DIAGNOSIS — I10 Essential (primary) hypertension: Secondary | ICD-10-CM | POA: Diagnosis not present

## 2017-04-14 DIAGNOSIS — M170 Bilateral primary osteoarthritis of knee: Secondary | ICD-10-CM | POA: Insufficient documentation

## 2017-04-14 DIAGNOSIS — Z1211 Encounter for screening for malignant neoplasm of colon: Secondary | ICD-10-CM

## 2017-04-14 NOTE — Patient Instructions (Signed)
1.  Pap smear is done 2.  Mammogram is ordered 3.  Stool guaiac card testing is ordered for colon cancer screening 4.  Screening labs are to be obtained through primary care 5.  Continue with healthy eating and exercise 6.  Recommend calcium supplementation 600 mg twice a day along with vitamin D 400 international units twice a day 7.  Smoking cessation encouraged 8.  Return in 1 year   Health Maintenance for Postmenopausal Women Menopause is a normal process in which your reproductive ability comes to an end. This process happens gradually over a span of months to years, usually between the ages of 84 and 48. Menopause is complete when you have missed 12 consecutive menstrual periods. It is important to talk with your health care provider about some of the most common conditions that affect postmenopausal women, such as heart disease, cancer, and bone loss (osteoporosis). Adopting a healthy lifestyle and getting preventive care can help to promote your health and wellness. Those actions can also lower your chances of developing some of these common conditions. What should I know about menopause? During menopause, you may experience a number of symptoms, such as:  Moderate-to-severe hot flashes.  Night sweats.  Decrease in sex drive.  Mood swings.  Headaches.  Tiredness.  Irritability.  Memory problems.  Insomnia.  Choosing to treat or not to treat menopausal changes is an individual decision that you make with your health care provider. What should I know about hormone replacement therapy and supplements? Hormone therapy products are effective for treating symptoms that are associated with menopause, such as hot flashes and night sweats. Hormone replacement carries certain risks, especially as you become older. If you are thinking about using estrogen or estrogen with progestin treatments, discuss the benefits and risks with your health care provider. What should I know about  heart disease and stroke? Heart disease, heart attack, and stroke become more likely as you age. This may be due, in part, to the hormonal changes that your body experiences during menopause. These can affect how your body processes dietary fats, triglycerides, and cholesterol. Heart attack and stroke are both medical emergencies. There are many things that you can do to help prevent heart disease and stroke:  Have your blood pressure checked at least every 1-2 years. High blood pressure causes heart disease and increases the risk of stroke.  If you are 70-50 years old, ask your health care provider if you should take aspirin to prevent a heart attack or a stroke.  Do not use any tobacco products, including cigarettes, chewing tobacco, or electronic cigarettes. If you need help quitting, ask your health care provider.  It is important to eat a healthy diet and maintain a healthy weight. ? Be sure to include plenty of vegetables, fruits, low-fat dairy products, and lean protein. ? Avoid eating foods that are high in solid fats, added sugars, or salt (sodium).  Get regular exercise. This is one of the most important things that you can do for your health. ? Try to exercise for at least 150 minutes each week. The type of exercise that you do should increase your heart rate and make you sweat. This is known as moderate-intensity exercise. ? Try to do strengthening exercises at least twice each week. Do these in addition to the moderate-intensity exercise.  Know your numbers.Ask your health care provider to check your cholesterol and your blood glucose. Continue to have your blood tested as directed by your health care provider.  What should I know about cancer screening? There are several types of cancer. Take the following steps to reduce your risk and to catch any cancer development as early as possible. Breast Cancer  Practice breast self-awareness. ? This means understanding how your  breasts normally appear and feel. ? It also means doing regular breast self-exams. Let your health care provider know about any changes, no matter how small.  If you are 98 or older, have a clinician do a breast exam (clinical breast exam or CBE) every year. Depending on your age, family history, and medical history, it may be recommended that you also have a yearly breast X-ray (mammogram).  If you have a family history of breast cancer, talk with your health care provider about genetic screening.  If you are at high risk for breast cancer, talk with your health care provider about having an MRI and a mammogram every year.  Breast cancer (BRCA) gene test is recommended for women who have family members with BRCA-related cancers. Results of the assessment will determine the need for genetic counseling and BRCA1 and for BRCA2 testing. BRCA-related cancers include these types: ? Breast. This occurs in males or females. ? Ovarian. ? Tubal. This may also be called fallopian tube cancer. ? Cancer of the abdominal or pelvic lining (peritoneal cancer). ? Prostate. ? Pancreatic.  Cervical, Uterine, and Ovarian Cancer Your health care provider may recommend that you be screened regularly for cancer of the pelvic organs. These include your ovaries, uterus, and vagina. This screening involves a pelvic exam, which includes checking for microscopic changes to the surface of your cervix (Pap test).  For women ages 21-65, health care providers may recommend a pelvic exam and a Pap test every three years. For women ages 52-65, they may recommend the Pap test and pelvic exam, combined with testing for human papilloma virus (HPV), every five years. Some types of HPV increase your risk of cervical cancer. Testing for HPV may also be done on women of any age who have unclear Pap test results.  Other health care providers may not recommend any screening for nonpregnant women who are considered low risk for pelvic  cancer and have no symptoms. Ask your health care provider if a screening pelvic exam is right for you.  If you have had past treatment for cervical cancer or a condition that could lead to cancer, you need Pap tests and screening for cancer for at least 20 years after your treatment. If Pap tests have been discontinued for you, your risk factors (such as having a new sexual partner) need to be reassessed to determine if you should start having screenings again. Some women have medical problems that increase the chance of getting cervical cancer. In these cases, your health care provider may recommend that you have screening and Pap tests more often.  If you have a family history of uterine cancer or ovarian cancer, talk with your health care provider about genetic screening.  If you have vaginal bleeding after reaching menopause, tell your health care provider.  There are currently no reliable tests available to screen for ovarian cancer.  Lung Cancer Lung cancer screening is recommended for adults 32-56 years old who are at high risk for lung cancer because of a history of smoking. A yearly low-dose CT scan of the lungs is recommended if you:  Currently smoke.  Have a history of at least 30 pack-years of smoking and you currently smoke or have quit within the past  15 years. A pack-year is smoking an average of one pack of cigarettes per day for one year.  Yearly screening should:  Continue until it has been 15 years since you quit.  Stop if you develop a health problem that would prevent you from having lung cancer treatment.  Colorectal Cancer  This type of cancer can be detected and can often be prevented.  Routine colorectal cancer screening usually begins at age 9 and continues through age 91.  If you have risk factors for colon cancer, your health care provider may recommend that you be screened at an earlier age.  If you have a family history of colorectal cancer, talk with  your health care provider about genetic screening.  Your health care provider may also recommend using home test kits to check for hidden blood in your stool.  A small camera at the end of a tube can be used to examine your colon directly (sigmoidoscopy or colonoscopy). This is done to check for the earliest forms of colorectal cancer.  Direct examination of the colon should be repeated every 5-10 years until age 19. However, if early forms of precancerous polyps or small growths are found or if you have a family history or genetic risk for colorectal cancer, you may need to be screened more often.  Skin Cancer  Check your skin from head to toe regularly.  Monitor any moles. Be sure to tell your health care provider: ? About any new moles or changes in moles, especially if there is a change in a mole's shape or color. ? If you have a mole that is larger than the size of a pencil eraser.  If any of your family members has a history of skin cancer, especially at a young age, talk with your health care provider about genetic screening.  Always use sunscreen. Apply sunscreen liberally and repeatedly throughout the day.  Whenever you are outside, protect yourself by wearing long sleeves, pants, a wide-brimmed hat, and sunglasses.  What should I know about osteoporosis? Osteoporosis is a condition in which bone destruction happens more quickly than new bone creation. After menopause, you may be at an increased risk for osteoporosis. To help prevent osteoporosis or the bone fractures that can happen because of osteoporosis, the following is recommended:  If you are 66-54 years old, get at least 1,000 mg of calcium and at least 600 mg of vitamin D per day.  If you are older than age 59 but younger than age 81, get at least 1,200 mg of calcium and at least 600 mg of vitamin D per day.  If you are older than age 89, get at least 1,200 mg of calcium and at least 800 mg of vitamin D per  day.  Smoking and excessive alcohol intake increase the risk of osteoporosis. Eat foods that are rich in calcium and vitamin D, and do weight-bearing exercises several times each week as directed by your health care provider. What should I know about how menopause affects my mental health? Depression may occur at any age, but it is more common as you become older. Common symptoms of depression include:  Low or sad mood.  Changes in sleep patterns.  Changes in appetite or eating patterns.  Feeling an overall lack of motivation or enjoyment of activities that you previously enjoyed.  Frequent crying spells.  Talk with your health care provider if you think that you are experiencing depression. What should I know about immunizations? It is important  that you get and maintain your immunizations. These include:  Tetanus, diphtheria, and pertussis (Tdap) booster vaccine.  Influenza every year before the flu season begins.  Pneumonia vaccine.  Shingles vaccine.  Your health care provider may also recommend other immunizations. This information is not intended to replace advice given to you by your health care provider. Make sure you discuss any questions you have with your health care provider. Document Released: 07/03/2005 Document Revised: 11/29/2015 Document Reviewed: 02/12/2015 Elsevier Interactive Patient Education  2018 [employer]  

## 2017-04-16 LAB — IGP, COBASHPV16/18
HPV 16: NEGATIVE
HPV 18: NEGATIVE
HPV other hr types: NEGATIVE
PAP Smear Comment: 0

## 2017-04-20 ENCOUNTER — Encounter: Payer: Self-pay | Admitting: Obstetrics and Gynecology

## 2017-05-12 DIAGNOSIS — E785 Hyperlipidemia, unspecified: Secondary | ICD-10-CM | POA: Diagnosis not present

## 2017-05-12 DIAGNOSIS — Z1231 Encounter for screening mammogram for malignant neoplasm of breast: Secondary | ICD-10-CM | POA: Diagnosis not present

## 2017-05-12 DIAGNOSIS — I10 Essential (primary) hypertension: Secondary | ICD-10-CM | POA: Diagnosis not present

## 2017-05-12 DIAGNOSIS — Z720 Tobacco use: Secondary | ICD-10-CM | POA: Diagnosis not present

## 2017-05-12 DIAGNOSIS — E119 Type 2 diabetes mellitus without complications: Secondary | ICD-10-CM | POA: Diagnosis not present

## 2017-06-26 DIAGNOSIS — J40 Bronchitis, not specified as acute or chronic: Secondary | ICD-10-CM | POA: Diagnosis not present

## 2017-07-28 DIAGNOSIS — Z0000 Encounter for general adult medical examination without abnormal findings: Secondary | ICD-10-CM | POA: Diagnosis not present

## 2017-07-28 DIAGNOSIS — G7109 Other specified muscular dystrophies: Secondary | ICD-10-CM | POA: Diagnosis not present

## 2017-07-28 DIAGNOSIS — I10 Essential (primary) hypertension: Secondary | ICD-10-CM | POA: Diagnosis not present

## 2017-07-28 DIAGNOSIS — Z720 Tobacco use: Secondary | ICD-10-CM | POA: Diagnosis not present

## 2017-08-04 DIAGNOSIS — E119 Type 2 diabetes mellitus without complications: Secondary | ICD-10-CM | POA: Diagnosis not present

## 2017-08-04 DIAGNOSIS — G7109 Other specified muscular dystrophies: Secondary | ICD-10-CM | POA: Diagnosis not present

## 2017-08-04 DIAGNOSIS — E785 Hyperlipidemia, unspecified: Secondary | ICD-10-CM | POA: Diagnosis not present

## 2017-08-04 DIAGNOSIS — Z720 Tobacco use: Secondary | ICD-10-CM | POA: Diagnosis not present

## 2017-08-12 DIAGNOSIS — G7109 Other specified muscular dystrophies: Secondary | ICD-10-CM | POA: Diagnosis not present

## 2017-08-12 DIAGNOSIS — R0683 Snoring: Secondary | ICD-10-CM | POA: Diagnosis not present

## 2017-09-28 DIAGNOSIS — E538 Deficiency of other specified B group vitamins: Secondary | ICD-10-CM | POA: Diagnosis not present

## 2017-09-28 DIAGNOSIS — G7109 Other specified muscular dystrophies: Secondary | ICD-10-CM | POA: Diagnosis not present

## 2017-09-28 DIAGNOSIS — G2581 Restless legs syndrome: Secondary | ICD-10-CM | POA: Diagnosis not present

## 2017-09-28 DIAGNOSIS — E559 Vitamin D deficiency, unspecified: Secondary | ICD-10-CM | POA: Diagnosis not present

## 2017-09-29 DIAGNOSIS — G4733 Obstructive sleep apnea (adult) (pediatric): Secondary | ICD-10-CM | POA: Diagnosis not present

## 2017-10-11 DIAGNOSIS — E538 Deficiency of other specified B group vitamins: Secondary | ICD-10-CM | POA: Diagnosis not present

## 2017-10-19 DIAGNOSIS — K130 Diseases of lips: Secondary | ICD-10-CM | POA: Diagnosis not present

## 2017-10-19 DIAGNOSIS — E538 Deficiency of other specified B group vitamins: Secondary | ICD-10-CM | POA: Diagnosis not present

## 2017-10-26 DIAGNOSIS — E538 Deficiency of other specified B group vitamins: Secondary | ICD-10-CM | POA: Diagnosis not present

## 2017-11-02 DIAGNOSIS — E538 Deficiency of other specified B group vitamins: Secondary | ICD-10-CM | POA: Diagnosis not present

## 2017-11-11 DIAGNOSIS — Z720 Tobacco use: Secondary | ICD-10-CM | POA: Diagnosis not present

## 2017-11-11 DIAGNOSIS — E785 Hyperlipidemia, unspecified: Secondary | ICD-10-CM | POA: Diagnosis not present

## 2017-11-11 DIAGNOSIS — I499 Cardiac arrhythmia, unspecified: Secondary | ICD-10-CM | POA: Insufficient documentation

## 2017-11-11 DIAGNOSIS — I10 Essential (primary) hypertension: Secondary | ICD-10-CM | POA: Diagnosis not present

## 2017-11-11 DIAGNOSIS — I498 Other specified cardiac arrhythmias: Secondary | ICD-10-CM | POA: Diagnosis not present

## 2017-11-15 DIAGNOSIS — I498 Other specified cardiac arrhythmias: Secondary | ICD-10-CM | POA: Diagnosis not present

## 2017-11-30 DIAGNOSIS — E119 Type 2 diabetes mellitus without complications: Secondary | ICD-10-CM | POA: Diagnosis not present

## 2017-11-30 DIAGNOSIS — E785 Hyperlipidemia, unspecified: Secondary | ICD-10-CM | POA: Diagnosis not present

## 2017-11-30 DIAGNOSIS — G7109 Other specified muscular dystrophies: Secondary | ICD-10-CM | POA: Diagnosis not present

## 2017-11-30 DIAGNOSIS — Z720 Tobacco use: Secondary | ICD-10-CM | POA: Diagnosis not present

## 2017-11-30 DIAGNOSIS — I498 Other specified cardiac arrhythmias: Secondary | ICD-10-CM | POA: Diagnosis not present

## 2017-11-30 DIAGNOSIS — E538 Deficiency of other specified B group vitamins: Secondary | ICD-10-CM | POA: Diagnosis not present

## 2017-11-30 DIAGNOSIS — I10 Essential (primary) hypertension: Secondary | ICD-10-CM | POA: Diagnosis not present

## 2017-12-06 DIAGNOSIS — R002 Palpitations: Secondary | ICD-10-CM | POA: Diagnosis not present

## 2017-12-07 DIAGNOSIS — I10 Essential (primary) hypertension: Secondary | ICD-10-CM | POA: Diagnosis not present

## 2017-12-07 DIAGNOSIS — E119 Type 2 diabetes mellitus without complications: Secondary | ICD-10-CM | POA: Diagnosis not present

## 2017-12-07 DIAGNOSIS — Z720 Tobacco use: Secondary | ICD-10-CM | POA: Diagnosis not present

## 2017-12-07 DIAGNOSIS — E1121 Type 2 diabetes mellitus with diabetic nephropathy: Secondary | ICD-10-CM | POA: Diagnosis not present

## 2017-12-28 DIAGNOSIS — E538 Deficiency of other specified B group vitamins: Secondary | ICD-10-CM | POA: Diagnosis not present

## 2018-01-06 DIAGNOSIS — E538 Deficiency of other specified B group vitamins: Secondary | ICD-10-CM | POA: Diagnosis not present

## 2018-02-01 DIAGNOSIS — E538 Deficiency of other specified B group vitamins: Secondary | ICD-10-CM | POA: Diagnosis not present

## 2018-02-28 DIAGNOSIS — M25561 Pain in right knee: Secondary | ICD-10-CM | POA: Diagnosis not present

## 2018-02-28 DIAGNOSIS — M1711 Unilateral primary osteoarthritis, right knee: Secondary | ICD-10-CM | POA: Diagnosis not present

## 2018-04-19 ENCOUNTER — Encounter: Payer: BLUE CROSS/BLUE SHIELD | Admitting: Obstetrics and Gynecology

## 2018-04-19 DIAGNOSIS — Z86010 Personal history of colonic polyps: Secondary | ICD-10-CM | POA: Diagnosis not present

## 2018-04-19 DIAGNOSIS — K210 Gastro-esophageal reflux disease with esophagitis: Secondary | ICD-10-CM | POA: Diagnosis not present

## 2018-04-20 ENCOUNTER — Encounter: Payer: Self-pay | Admitting: *Deleted

## 2018-04-25 ENCOUNTER — Ambulatory Visit
Admission: RE | Admit: 2018-04-25 | Discharge: 2018-04-25 | Disposition: A | Discharge status: Home / Self Care | Payer: BLUE CROSS/BLUE SHIELD | Source: Ambulatory Visit | Attending: Gastroenterology | Admitting: Gastroenterology

## 2018-04-25 ENCOUNTER — Ambulatory Visit: Payer: BLUE CROSS/BLUE SHIELD | Admitting: Anesthesiology

## 2018-04-25 ENCOUNTER — Encounter: Payer: Self-pay | Admitting: *Deleted

## 2018-04-25 ENCOUNTER — Encounter
Admission: RE | Disposition: A | Discharge status: Home / Self Care | Payer: Self-pay | Source: Ambulatory Visit | Attending: Gastroenterology

## 2018-04-25 DIAGNOSIS — J449 Chronic obstructive pulmonary disease, unspecified: Secondary | ICD-10-CM | POA: Insufficient documentation

## 2018-04-25 DIAGNOSIS — Z7984 Long term (current) use of oral hypoglycemic drugs: Secondary | ICD-10-CM | POA: Diagnosis not present

## 2018-04-25 DIAGNOSIS — D128 Benign neoplasm of rectum: Secondary | ICD-10-CM | POA: Diagnosis not present

## 2018-04-25 DIAGNOSIS — A630 Anogenital (venereal) warts: Secondary | ICD-10-CM | POA: Insufficient documentation

## 2018-04-25 DIAGNOSIS — Z800 Family history of malignant neoplasm of digestive organs: Secondary | ICD-10-CM | POA: Diagnosis not present

## 2018-04-25 DIAGNOSIS — D125 Benign neoplasm of sigmoid colon: Secondary | ICD-10-CM | POA: Insufficient documentation

## 2018-04-25 DIAGNOSIS — E119 Type 2 diabetes mellitus without complications: Secondary | ICD-10-CM | POA: Insufficient documentation

## 2018-04-25 DIAGNOSIS — Z91040 Latex allergy status: Secondary | ICD-10-CM | POA: Diagnosis not present

## 2018-04-25 DIAGNOSIS — M8580 Other specified disorders of bone density and structure, unspecified site: Secondary | ICD-10-CM | POA: Insufficient documentation

## 2018-04-25 DIAGNOSIS — K621 Rectal polyp: Secondary | ICD-10-CM | POA: Diagnosis not present

## 2018-04-25 DIAGNOSIS — K5730 Diverticulosis of large intestine without perforation or abscess without bleeding: Secondary | ICD-10-CM | POA: Diagnosis not present

## 2018-04-25 DIAGNOSIS — D123 Benign neoplasm of transverse colon: Secondary | ICD-10-CM | POA: Diagnosis not present

## 2018-04-25 DIAGNOSIS — E079 Disorder of thyroid, unspecified: Secondary | ICD-10-CM | POA: Insufficient documentation

## 2018-04-25 DIAGNOSIS — Z86010 Personal history of colonic polyps: Secondary | ICD-10-CM | POA: Diagnosis not present

## 2018-04-25 DIAGNOSIS — K5790 Diverticulosis of intestine, part unspecified, without perforation or abscess without bleeding: Secondary | ICD-10-CM | POA: Diagnosis not present

## 2018-04-25 DIAGNOSIS — I10 Essential (primary) hypertension: Secondary | ICD-10-CM | POA: Insufficient documentation

## 2018-04-25 DIAGNOSIS — E785 Hyperlipidemia, unspecified: Secondary | ICD-10-CM | POA: Diagnosis not present

## 2018-04-25 DIAGNOSIS — K635 Polyp of colon: Secondary | ICD-10-CM | POA: Diagnosis not present

## 2018-04-25 DIAGNOSIS — M1990 Unspecified osteoarthritis, unspecified site: Secondary | ICD-10-CM | POA: Insufficient documentation

## 2018-04-25 DIAGNOSIS — G7100 Muscular dystrophy, unspecified: Secondary | ICD-10-CM | POA: Insufficient documentation

## 2018-04-25 DIAGNOSIS — F17210 Nicotine dependence, cigarettes, uncomplicated: Secondary | ICD-10-CM | POA: Diagnosis not present

## 2018-04-25 DIAGNOSIS — Z1211 Encounter for screening for malignant neoplasm of colon: Secondary | ICD-10-CM | POA: Diagnosis not present

## 2018-04-25 DIAGNOSIS — Z880 Allergy status to penicillin: Secondary | ICD-10-CM | POA: Diagnosis not present

## 2018-04-25 DIAGNOSIS — D126 Benign neoplasm of colon, unspecified: Secondary | ICD-10-CM | POA: Diagnosis not present

## 2018-04-25 HISTORY — PX: COLONOSCOPY WITH PROPOFOL: SHX5780

## 2018-04-25 LAB — GLUCOSE, CAPILLARY: Glucose-Capillary: 114 mg/dL — ABNORMAL HIGH (ref 70–99)

## 2018-04-25 SURGERY — COLONOSCOPY WITH PROPOFOL
Anesthesia: General

## 2018-04-25 MED ORDER — PROPOFOL 500 MG/50ML IV EMUL
INTRAVENOUS | Status: DC | PRN
  Administered 2018-04-25: 100 ug/kg/min via INTRAVENOUS

## 2018-04-25 MED ORDER — MIDAZOLAM HCL 2 MG/2ML IJ SOLN
INTRAMUSCULAR | Status: DC | PRN
  Administered 2018-04-25: 1 mg via INTRAVENOUS

## 2018-04-25 MED ORDER — MIDAZOLAM HCL 2 MG/2ML IJ SOLN
INTRAMUSCULAR | Status: AC
  Filled 2018-04-25: qty 2

## 2018-04-25 MED ORDER — LIDOCAINE HCL (PF) 2 % IJ SOLN
INTRAMUSCULAR | Status: AC
  Filled 2018-04-25: qty 10

## 2018-04-25 MED ORDER — PROPOFOL 500 MG/50ML IV EMUL
INTRAVENOUS | Status: AC
  Filled 2018-04-25: qty 50

## 2018-04-25 MED ORDER — EPHEDRINE SULFATE 50 MG/ML IJ SOLN
INTRAMUSCULAR | Status: AC
  Filled 2018-04-25: qty 1

## 2018-04-25 MED ORDER — FENTANYL CITRATE (PF) 100 MCG/2ML IJ SOLN
INTRAMUSCULAR | Status: AC
  Filled 2018-04-25: qty 2

## 2018-04-25 MED ORDER — FENTANYL CITRATE (PF) 100 MCG/2ML IJ SOLN
INTRAMUSCULAR | Status: DC | PRN
  Administered 2018-04-25: 50 ug via INTRAVENOUS

## 2018-04-25 MED ORDER — SODIUM CHLORIDE 0.9 % IV SOLN
INTRAVENOUS | Status: DC
  Administered 2018-04-25: 14:00:00 via INTRAVENOUS

## 2018-04-25 MED ORDER — LIDOCAINE HCL (CARDIAC) PF 100 MG/5ML IV SOSY
PREFILLED_SYRINGE | INTRAVENOUS | Status: DC | PRN
  Administered 2018-04-25: 30 mg via INTRAVENOUS

## 2018-04-25 MED ORDER — EPHEDRINE SULFATE 50 MG/ML IJ SOLN
INTRAMUSCULAR | Status: DC | PRN
  Administered 2018-04-25 (×5): 5 mg via INTRAVENOUS
  Administered 2018-04-25: 10 mg via INTRAVENOUS
  Administered 2018-04-25: 5 mg via INTRAVENOUS

## 2018-04-25 NOTE — Anesthesia Preprocedure Evaluation (Addendum)
Anesthesia Evaluation  Patient identified by MRN, date of birth, ID band Patient awake    Reviewed: Allergy & Precautions, H&P , NPO status , Patient's Chart, lab work & pertinent test results  Airway Mallampati: III       Dental  (+) Teeth Intact   Pulmonary neg COPD, Current Smoker,           Cardiovascular hypertension, (-) angina(-) Past MI (-) dysrhythmias      Neuro/Psych PSYCHIATRIC DISORDERS Anxiety  Neuromuscular disease (MUSCULAR DYSTROPHY)    GI/Hepatic negative GI ROS, Neg liver ROS,   Endo/Other  diabetesHypothyroidism   Renal/GU negative Renal ROS  negative genitourinary   Musculoskeletal  (+) Arthritis ,   Abdominal   Peds  Hematology negative hematology ROS (+)   Anesthesia Other Findings Past Medical History: No date: Anal condyloma No date: Arthritis No date: Diabetes mellitus without complication ([facility]) No date: HLD (hyperlipidemia) No date: Hypertension No date: MD (muscular dystrophy) ([place]) No date: Menopause No date: Muscular dystrophy, limb girdle ([facility]) No date: Osteopenia No date: Recurrent UTI No date: Thyroid disease No date: Tobacco abuse  Past Surgical History: No date: NO PAST SURGERIES  BMI    Body Mass Index:  24.20 kg/m      Reproductive/Obstetrics negative OB ROS                            Anesthesia Physical Anesthesia Plan  ASA: III  Anesthesia Plan: General   Post-op Pain Management:    Induction:   PONV Risk Score and Plan: Propofol infusion and TIVA  Airway Management Planned: Natural Airway and Nasal Cannula  Additional Equipment:   Intra-op Plan:   Post-operative Plan:   Informed Consent: I have reviewed the patients History and Physical, chart, labs and discussed the procedure including the risks, benefits and alternatives for the proposed anesthesia with the patient or authorized representative who has indicated his/her  understanding and acceptance.   Dental Advisory Given  Plan Discussed with: Anesthesiologist  Anesthesia Plan Comments:         Anesthesia Quick Evaluation

## 2018-04-25 NOTE — Progress Notes (Deleted)
ANNUAL PREVENTATIVE CARE GYN  ENCOUNTER NOTE  Subjective:       Andrea Rogers is a 57 y.o. G0P0000 female here for a routine annual gynecologic exam.  Current complaints:  1. None  No new interval health issues. Muscular dystrophy is stable. Patient reports no significant bowel or bladder dysfunction. No significant vasomotor symptoms.  Patient recently found birth parents. Patient's stepmom is struggling with stage III breast cancer; stopped therapy; hospice involved    Gynecologic History No LMP recorded. Patient is postmenopausal. Contraception: post menopausal status Last Pap: 04/23/2017 neg/neg. Results were: normal Last mammogram: 2018- bibc wnl. Results were: normal  Obstetric History OB History  Gravida Para Term Preterm AB Living  0 0 0 0 0 0  SAB TAB Ectopic Multiple Live Births  0 0 0 0 0    Past Medical History:  Diagnosis Date  . Anal condyloma   . Arthritis   . Diabetes mellitus without complication ([place])   . HLD (hyperlipidemia)   . Hypertension   . MD (muscular dystrophy) ([place])   . Menopause   . 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 Osteopenia   . Recurrent UTI   . Thyroid disease   . Tobacco abuse     Past Surgical History:  Procedure Laterality Date  . NO PAST SURGERIES      Current Outpatient Medications on File Prior to Visit  Medication Sig Dispense Refill  . ALPRAZolam (XANAX) 0.25 MG tablet Take 0.25 mg by mouth at bedtime as needed for anxiety.    Marland Kitchen amitriptyline (ELAVIL) 100 MG tablet TAKE 1 TABLET BY MOUTH EVERY DAY AT BEDTIME    . atenolol (TENORMIN) 100 MG tablet Take by mouth.    . cyclobenzaprine (FLEXERIL) 10 MG tablet 1-2  tab po qd prn    . ezetimibe (ZETIA) 10 MG tablet TAKE 1 TABLET (10 MG TOTAL) BY MOUTH ONCE DAILY.    Marland Kitchen glimepiride (AMARYL) 1 MG tablet Take by mouth.    Marland Kitchen HYDROcodone-acetaminophen (NORCO) 7.5-325 MG tablet Take 1 tablet by mouth every 6 (six) hours as needed for moderate pain.    Marland Kitchen levothyroxine  (SYNTHROID, LEVOTHROID) 75 MCG tablet TAKE 1 TABLET BY MOUTH EVERY DAY    . lisinopril (PRINIVIL,ZESTRIL) 5 MG tablet TAKE 1 TABLET (5 MG TOTAL) BY MOUTH ONCE DAILY.    . metFORMIN (GLUCOPHAGE) 500 MG tablet TAKE 2 TABLET BY MOUTH TWICE A DAY AS DIRECTED    . triamterene-hydrochlorothiazide (MAXZIDE) 75-50 MG tablet TAKE 1 TABLET BY MOUTH ONCE DAILY.    Marland Kitchen Vitamin D, Ergocalciferol, (DRISDOL) 50000 units CAPS capsule TAKE 1 CAPSULE (50,000 UNITS TOTAL) BY MOUTH ONCE A WEEK.     No current facility-administered medications on file prior to visit.     Allergies  Allergen Reactions  . Latex   . Penicillins Other (See Comments)    Yeast infection    Social History   Socioeconomic History  . Marital status: Married    Spouse name: Not on file  . Number of children: Not on file  . Years of education: Not on file  . Highest education level: Not on file  Occupational History  . Not on file  Social Needs  . Financial resource strain: Not on file  . Food insecurity:    Worry: Not on file    Inability: Not on file  . Transportation needs:    Medical: Not on file    Non-medical: Not on file  Tobacco Use  .  Smoking status: Current Every Day Smoker    Packs/day: 1.00  . Smokeless tobacco: Never Used  Substance and Sexual Activity  . Alcohol use: No  . Drug use: No  . Sexual activity: Yes    Birth control/protection: Post-menopausal  Lifestyle  . Physical activity:    Days per week: 0 days    Minutes per session: 0 min  . Stress: Not on file  Relationships  . Social connections:    Talks on phone: Not on file    Gets together: Not on file    Attends religious service: Not on file    Active member of club or organization: Not on file    Attends meetings of clubs or organizations: Not on file    Relationship status: Not on file  . Intimate partner violence:    Fear of current or ex partner: No    Emotionally abused: No    Physically abused: No    Forced sexual activity: No   Other Topics Concern  . Not on file  Social History Narrative  . Not on file    Family History  Adopted: Yes  Problem Relation Age of Onset  . Breast cancer Mother   . Colon cancer Father   . Diabetes Father     The following portions of the patient's history were reviewed and updated as appropriate: allergies, current medications, past family history, past medical history, past social history, past surgical history and problem list.  Review of Systems    Objective:   There were no vitals taken for this visit. CONSTITUTIONAL: Well-developed, well-nourished female in no acute distress.  PSYCHIATRIC: Normal mood and affect. Normal behavior. Normal judgment and thought content. [place]: Alert and oriented to person, place, and time. Normal muscle tone coordination. No cranial nerve deficit noted. HENT:  Normocephalic, atraumatic, External right and left ear normal. Oropharynx is clear and moist EYES: Conjunctivae and EOM are normal. No scleral icterus.  NECK: Normal range of motion, supple, no masses.  Normal thyroid.  SKIN: Skin is warm and dry. No rash noted. Not diaphoretic. No erythema. No pallor. CARDIOVASCULAR: Normal heart rate noted, regular rhythm, no murmur. RESPIRATORY: Clear to auscultation bilaterally. Effort and breath sounds normal, no problems with respiration noted. BREASTS: Symmetric in size. No masses, skin changes, nipple drainage, or lymphadenopathy. ABDOMEN: Soft, normal bowel sounds, no distention noted.  No tenderness, rebound or guarding.  BLADDER: Normal PELVIC:  External Genitalia: Normal  BUS: Normal  Vagina: Mild atrophic changes; normal secretions  Cervix: Normal; no lesions; no cervical motion tenderness  Uterus: Normal; midplane, normal size and shape, mobile, nontender  Adnexa: Normal; nonpalpable and nontender  RV: External Exam NormaI, No Rectal Masses and Normal Sphincter tone  MUSCULOSKELETAL: Normal range of motion. No tenderness.  No  cyanosis, clubbing, or edema.  2+ distal pulses. LYMPHATIC: No Axillary, Supraclavicular, or Inguinal Adenopathy.    Assessment:   Annual gynecologic examination 57 y.o. Contraception: post menopausal status Normal BMI Problem List Items Addressed This Visit    None    Muscular dystrophy, stable  Plan:  Pap: Due 2021 Mammogram: Ordered Stool Guaiac Testing:  Ordered Labs: pcp Routine preventative health maintenance measures emphasized: Exercise/Diet/Weight control, Tobacco Warnings and Alcohol/Substance use risks  Calcium with vitamin D supplementation twice a day encouraged Return to [place], [place] acting as scribe for Dr. Enzo Bi.   Note: This dictation was prepared with Dragon dictation along with  smaller phrase technology. Any transcriptional errors that result from this process are unintentional.

## 2018-04-25 NOTE — Anesthesia Post-op Follow-up Note (Signed)
Anesthesia QCDR form completed.        

## 2018-04-25 NOTE — H&P (Signed)
Outpatient short stay form Pre-procedure 04/25/2018 1:35 PM Andrea Sails MD  Primary Physician: Dr Tracie Harrier  Reason for visit: Colonoscopy  History of present illness: Patient is a 57 year old female presenting today for colonoscopy.  She has a personal history of colon polyps and a family history of colon cancer in a primary relative, father.  She tolerated her prep well.  She takes no aspirin or blood thinning agent.  Her last colonoscopy was in 2015 serrated adenoma was removed at that time.    Current Facility-Administered Medications:  .  0.9 %  sodium chloride infusion, , Intravenous, Continuous, Andrea Sails, MD  Medications Prior to Admission  Medication Sig Dispense Refill Last Dose  . atenolol (TENORMIN) 100 MG tablet Take by mouth.   04/25/2018 at Unknown time  . cyclobenzaprine (FLEXERIL) 10 MG tablet 1-2  tab po qd prn   Past Week at Unknown time  . HYDROcodone-acetaminophen (NORCO) 7.5-325 MG tablet Take 1 tablet by mouth every 6 (six) hours as needed for moderate pain.   Past Month at Unknown time  . lisinopril (PRINIVIL,ZESTRIL) 5 MG tablet TAKE 1 TABLET (5 MG TOTAL) BY MOUTH ONCE DAILY.   04/25/2018 at Unknown time  . ALPRAZolam (XANAX) 0.25 MG tablet Take 0.25 mg by mouth at bedtime as needed for anxiety.   04/23/2018  . amitriptyline (ELAVIL) 100 MG tablet TAKE 1 TABLET BY MOUTH EVERY DAY AT BEDTIME   Not Taking at Unknown time  . ezetimibe (ZETIA) 10 MG tablet TAKE 1 TABLET (10 MG TOTAL) BY MOUTH ONCE DAILY.   04/23/2018  . glimepiride (AMARYL) 1 MG tablet Take by mouth.   Taking  . levothyroxine (SYNTHROID, LEVOTHROID) 75 MCG tablet TAKE 1 TABLET BY MOUTH EVERY DAY   Taking  . metFORMIN (GLUCOPHAGE) 500 MG tablet TAKE 2 TABLET BY MOUTH TWICE A DAY AS DIRECTED   Taking  . triamterene-hydrochlorothiazide (MAXZIDE) 75-50 MG tablet TAKE 1 TABLET BY MOUTH ONCE DAILY.   Taking  . Vitamin D, Ergocalciferol, (DRISDOL) 50000 units CAPS capsule TAKE 1 CAPSULE  (50,000 UNITS TOTAL) BY MOUTH ONCE A WEEK.   Not Taking at Unknown time     Allergies  Allergen Reactions  . Latex   . Penicillins Other (See Comments)    Yeast infection     Past Medical History:  Diagnosis Date  . Anal condyloma   . Arthritis   . Diabetes mellitus without complication ([place])   . HLD (hyperlipidemia)   . Hypertension   . MD (muscular dystrophy) ([place])   . Menopause   . 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 Osteopenia   . Recurrent UTI   . Thyroid disease   . Tobacco abuse     Review of systems:      Physical Exam    Heart and lungs: Regular rate and rhythm without rub or gallop, lungs are bilaterally clear.    HEENT: Normocephalic, atraumatic eyes are anicteric    Other:    Pertinant exam for procedure: Soft nontender nondistended bowel sounds positive and normoactive    Planned proceedures: Colonoscopy and indicated procedures. I have discussed the risks benefits and complications of procedures to include not limited to bleeding, infection, perforation and the risk of sedation and the patient wishes to proceed.    Andrea Sails, MD Gastroenterology 04/25/2018  1:35 PM

## 2018-04-25 NOTE — Anesthesia Procedure Notes (Signed)
Performed by: Cook-Martin, Genavive Kubicki Pre-anesthesia Checklist: Patient identified, Emergency Drugs available, Suction available, Patient being monitored and Timeout performed Patient Re-evaluated:Patient Re-evaluated prior to induction Oxygen Delivery Method: Simple face mask Preoxygenation: Pre-oxygenation with 100% oxygen Induction Type: IV induction Placement Confirmation: CO2 detector and positive ETCO2       

## 2018-04-25 NOTE — Transfer of Care (Signed)
Immediate Anesthesia Transfer of Care Note  Patient: Andrea Rogers  Procedure(s) Performed: COLONOSCOPY WITH PROPOFOL (N/A )  Patient Location: PACU  Anesthesia Type:General  Level of Consciousness: awake and sedated  Airway & Oxygen Therapy: Patient Spontanous Breathing and Patient connected to nasal cannula oxygen  Post-op Assessment: Report given to RN and Post -op Vital signs reviewed and stable  Post vital signs: Reviewed and stable  Last Vitals:  Vitals Value Taken Time  BP    Temp    Pulse    Resp    SpO2      Last Pain:  Vitals:   04/25/18 1323  TempSrc: Tympanic  PainSc: 0-No pain         Complications: No apparent anesthesia complications

## 2018-04-25 NOTE — Op Note (Signed)
[facility] Patient Name: Andrea Rogers Procedure Date: 04/25/2018 1:20 PM MRN: [id number] Account #: [id number] Date of Birth: 28-Oct-1960 Admit Type: Outpatient Age: 57 Room: [facility] ENDO ROOM 3 Gender: Female Note Status: Finalized Procedure:            Colonoscopy Indications:          Family history of colon cancer in a first-degree                        relative Providers:            Lollie Sails, MD Referring MD:         Tracie Harrier, MD (Referring MD) Medicines:            Monitored Anesthesia Care Complications:        No immediate complications. Procedure:            Pre-Anesthesia Assessment:                       - ASA Grade Assessment: III - A patient with severe                        systemic disease.                       After obtaining informed consent, the colonoscope was                        passed under direct vision. Throughout the procedure,                        the patient's blood pressure, pulse, and oxygen                        saturations were monitored continuously. The                        Colonoscope was introduced through the anus and                        advanced to the the cecum, identified by appendiceal                        orifice and ileocecal valve. The colonoscopy was                        performed with moderate difficulty due to inability to                        hold insufflation. The patient tolerated the procedure                        well. The quality of the bowel preparation was fair. Findings:      Multiple small-mouthed diverticula were found in the sigmoid colon.      The sigmoid colon was significantly redundant.      A 3 mm polyp was found in the distal transverse colon. The polyp was       sessile. The polyp was removed with a cold biopsy forceps. Resection and       retrieval were complete.      A  3 mm polyp was found in the distal sigmoid colon. The polyp was       sessile.  The polyp was removed with a cold biopsy forceps. Resection and       retrieval were complete.      Two sessile polyps were found in the sigmoid colon and distal sigmoid       colon. The polyps were 2 to 3 mm in size. These polyps were removed with       a cold biopsy forceps. Resection and retrieval were complete.      Two sessile polyps were found in the rectum. The polyps were 2 to 3 mm       in size. These polyps were removed with a cold biopsy forceps. Resection       and retrieval were complete.      The perianal exam findings include perianal condylomata.      The digital rectal exam findings include decreased sphincter tone,       normal otherwise. Impression:           - Preparation of the colon was fair.                       - Diverticulosis in the sigmoid colon.                       - Redundant colon.                       - One 3 mm polyp in the distal transverse colon,                        removed with a cold biopsy forceps. Resected and                        retrieved.                       - One 3 mm polyp in the distal sigmoid colon, removed                        with a cold biopsy forceps. Resected and retrieved.                       - Two 2 to 3 mm polyps in the sigmoid colon and in the                        distal sigmoid colon, removed with a cold biopsy                        forceps. Resected and retrieved.                       - Two 2 to 3 mm polyps in the rectum, removed with a                        cold biopsy forceps. Resected and retrieved.                       - Perianal condylomata found on perianal exam. Recommendation:       - Discharge patient to home.                       -  Soft diet today, then advance as tolerated to advance                        diet as tolerated.                       - Await pathology results.                       - Refer to dermatologist to be arranged at appointment                        to be scheduled.                        - Telephone GI clinic for pathology results in 1 week. Procedure Code(s):    --- Professional ---                       [phone], Colonoscopy, flexible; with biopsy, single or                        multiple Diagnosis Code(s):    --- Professional ---                       D12.3, Benign neoplasm of transverse colon (hepatic                        flexure or splenic flexure)                       D12.5, Benign neoplasm of sigmoid colon                       K62.1, Rectal polyp                       A63.0, Anogenital (venereal) warts                       Z80.0, Family history of malignant neoplasm of                        digestive organs                       K57.30, Diverticulosis of large intestine without                        perforation or abscess without bleeding                       Q43.8, Other specified congenital malformations of                        intestine CPT copyright 2018 American Medical Association. All rights reserved. The codes documented in this report are preliminary and upon coder review may  be revised to meet current compliance requirements. Lollie Sails, MD 04/25/2018 2:59:42 PM This report has been signed electronically. Number of Addenda: 0 Note Initiated On: 04/25/2018 1:20 PM Scope Withdrawal Time: 0 hours 28 minutes 3 seconds  Total Procedure Duration: 0 hours 48 minutes 45 seconds       [facility]

## 2018-04-26 ENCOUNTER — Encounter: Payer: BLUE CROSS/BLUE SHIELD | Admitting: Obstetrics and Gynecology

## 2018-04-26 DIAGNOSIS — Z1389 Encounter for screening for other disorder: Secondary | ICD-10-CM | POA: Diagnosis not present

## 2018-04-26 DIAGNOSIS — E119 Type 2 diabetes mellitus without complications: Secondary | ICD-10-CM | POA: Diagnosis not present

## 2018-04-26 DIAGNOSIS — I10 Essential (primary) hypertension: Secondary | ICD-10-CM | POA: Diagnosis not present

## 2018-04-26 DIAGNOSIS — E538 Deficiency of other specified B group vitamins: Secondary | ICD-10-CM | POA: Diagnosis not present

## 2018-04-26 DIAGNOSIS — Z0000 Encounter for general adult medical examination without abnormal findings: Secondary | ICD-10-CM | POA: Diagnosis not present

## 2018-04-26 NOTE — Progress Notes (Signed)
ANNUAL PREVENTATIVE CARE GYN  ENCOUNTER NOTE  Subjective:       Andrea Rogers is a 57 y.o. G0P0000 female here for a routine annual gynecologic exam.  Current complaints:  1. None  Patient reports grieving from the passing of her stepmom in October. Limb-girdle muscular dystrophy is slightly progressing; she now is needing help getting up from a sitting position. Colonoscopy on 04/25/2018 demonstrated 9 polyps, pathology pending. Bowel and bladder function are unchanged. The patient has been referred to dermatology for excision of anal polyps recently identified.  Gynecologic History No LMP recorded. Patient is postmenopausal. Contraception: post menopausal status Last Pap: 03/2017 neg/neg. Results were: normal Last mammogram: 2018- bibc wnl. Results were: normal  Obstetric History OB History  Gravida Para Term Preterm AB Living  0 0 0 0 0 0  SAB TAB Ectopic Multiple Live Births  0 0 0 0 0    Past Medical History:  Diagnosis Date  . Anal condyloma   . Arthritis   . Diabetes mellitus without complication ([place])   . HLD (hyperlipidemia)   . Hypertension   . MD (muscular dystrophy) ([place])   . Menopause   . 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 Osteopenia   . Recurrent UTI   . Thyroid disease   . Tobacco abuse     Past Surgical History:  Procedure Laterality Date  . NO PAST SURGERIES      Current Outpatient Medications on File Prior to Visit  Medication Sig Dispense Refill  . ALPRAZolam (XANAX) 0.25 MG tablet Take 0.25 mg by mouth at bedtime as needed for anxiety.    Marland Kitchen amitriptyline (ELAVIL) 100 MG tablet TAKE 1 TABLET BY MOUTH EVERY DAY AT BEDTIME    . atenolol (TENORMIN) 100 MG tablet Take by mouth.    . cyclobenzaprine (FLEXERIL) 10 MG tablet 1-2  tab po qd prn    . ezetimibe (ZETIA) 10 MG tablet TAKE 1 TABLET (10 MG TOTAL) BY MOUTH ONCE DAILY.    Marland Kitchen glimepiride (AMARYL) 1 MG tablet Take by mouth.    Marland Kitchen HYDROcodone-acetaminophen (NORCO) 7.5-325 MG tablet Take 1  tablet by mouth every 6 (six) hours as needed for moderate pain.    Marland Kitchen levothyroxine (SYNTHROID, LEVOTHROID) 75 MCG tablet TAKE 1 TABLET BY MOUTH EVERY DAY    . lisinopril (PRINIVIL,ZESTRIL) 5 MG tablet TAKE 1 TABLET (5 MG TOTAL) BY MOUTH ONCE DAILY.    . metFORMIN (GLUCOPHAGE) 500 MG tablet TAKE 2 TABLET BY MOUTH TWICE A DAY AS DIRECTED    . triamterene-hydrochlorothiazide (MAXZIDE) 75-50 MG tablet TAKE 1 TABLET BY MOUTH ONCE DAILY.    Marland Kitchen Vitamin D, Ergocalciferol, (DRISDOL) 50000 units CAPS capsule TAKE 1 CAPSULE (50,000 UNITS TOTAL) BY MOUTH ONCE A WEEK.     No current facility-administered medications on file prior to visit.     Allergies  Allergen Reactions  . Latex   . Penicillins Other (See Comments)    Yeast infection    Social History   Socioeconomic History  . Marital status: Married    Spouse name: Not on file  . Number of children: Not on file  . Years of education: Not on file  . Highest education level: Not on file  Occupational History  . Not on file  Social Needs  . Financial resource strain: Not on file  . Food insecurity:    Worry: Not on file    Inability: Not on file  . Transportation needs:    Medical: Not  on file    Non-medical: Not on file  Tobacco Use  . Smoking status: Current Every Day Smoker    Packs/day: 1.00    Types: Cigarettes  . Smokeless tobacco: Never Used  Substance and Sexual Activity  . Alcohol use: No  . Drug use: No  . Sexual activity: Yes    Birth control/protection: Post-menopausal  Lifestyle  . Physical activity:    Days per week: 0 days    Minutes per session: 0 min  . Stress: Not on file  Relationships  . Social connections:    Talks on phone: Not on file    Gets together: Not on file    Attends religious service: Not on file    Active member of club or organization: Not on file    Attends meetings of clubs or organizations: Not on file    Relationship status: Not on file  . Intimate partner violence:    Fear of  current or ex partner: No    Emotionally abused: No    Physically abused: No    Forced sexual activity: No  Other Topics Concern  . Not on file  Social History Narrative  . Not on file    Family History  Adopted: Yes  Problem Relation Age of Onset  . Breast cancer Mother   . Colon cancer Father   . Diabetes Father     The following portions of the patient's history were reviewed and updated as appropriate: allergies, current medications, past family history, past medical history, past social history, past surgical history and problem list.  Review of Systems  Review of Systems  Constitutional: Positive for malaise/fatigue. Negative for chills and fever.  Respiratory: Negative.   Cardiovascular: Negative.   Gastrointestinal: Negative.   Genitourinary: Negative.   Musculoskeletal:       Limb-girdle muscular dystrophy is slowly progressing, now with having difficulty getting up from sitting positions  Skin: Negative.   Psychiatric/Behavioral:       Grief ongoing since loss of stepmom 2 months ago  All other systems reviewed and are negative.   Objective:   BP 108/64   Pulse 65   Ht 5\' 4"  (1.626 m)   Wt 145 lb 14.4 oz (66.2 kg)   BMI 25.04 kg/m  CONSTITUTIONAL: Well-developed, well-nourished female in no acute distress.  Patient requires assistance in getting off of the exam table due to compromised from her muscular dystrophy PSYCHIATRIC: Normal mood and affect. Normal behavior. Normal judgment and thought content. [place]: Alert and oriented to person, place, and time. Normal muscle tone coordination. No cranial nerve deficit noted. HENT:  Normocephalic, atraumatic, External right and left ear normal. EYES: Conjunctivae and EOM are normal. No scleral icterus.  NECK: Normal range of motion, supple, no masses.  Normal thyroid.  SKIN: Skin is warm and dry. No rash noted. Not diaphoretic. No erythema. No pallor. CARDIOVASCULAR: Normal heart rate noted, regular rhythm, no  murmur. RESPIRATORY: Clear to auscultation bilaterally. Effort and breath sounds normal, no problems with respiration noted. BREASTS: Symmetric in size. No masses, skin changes, nipple drainage, or lymphadenopathy. ABDOMEN: Soft, normal bowel sounds, no distention noted.  No tenderness, rebound or guarding.  BLADDER: Normal PELVIC:  External Genitalia: Normal  BUS: Normal  Vagina: Mild to moderate atrophic changes; normal secretions  Cervix: Normal; no lesions; no cervical motion tenderness  Uterus: Normal; midplane, normal size and shape, mobile, nontender  Adnexa: Normal; nonpalpable and nontender  RV: External exam notable for 2 perianal warts, No  Rectal Masses and Normal Sphincter tone  MUSCULOSKELETAL: Normal range of motion. No tenderness.  No cyanosis, clubbing, or edema.  2+ distal pulses. LYMPHATIC: No Axillary, Supraclavicular, or Inguinal Adenopathy.    Assessment:   Annual gynecologic examination 57 y.o. Contraception: post menopausal status Normal BMI Muscular dystrophy, limb girdle, slowly progressing Perianal condylomata x2, scheduled for removal at [facility] dermatology Colonoscopy 04/25/2018 demonstrated benign polyps, pathology pending Plan:  Pap: Due 2021 Mammogram: Ordered Stool Guaiac Testing:  Colonoscopy 04/25/18 Labs: pcp Routine preventative health maintenance measures emphasized: Exercise/Diet/Weight control, Tobacco Warnings and Alcohol/Substance use risks  Return to [place], CMA   Shaune Pascal CMA acting as scribe for Dr. Enzo Bi. I have reviewed, updated, and concur with the information scribed. Brayton Mars, MD  Note: This dictation was prepared with Dragon dictation along with smaller phrase technology. Any transcriptional errors that result from this process are unintentional.

## 2018-04-26 NOTE — Anesthesia Postprocedure Evaluation (Signed)
Anesthesia Post Note  Patient: Andrea Rogers  Procedure(s) Performed: COLONOSCOPY WITH PROPOFOL (N/A )  Patient location during evaluation: PACU Anesthesia Type: General Level of consciousness: awake and alert Pain management: pain level controlled Vital Signs Assessment: post-procedure vital signs reviewed and stable Respiratory status: spontaneous breathing, nonlabored ventilation, respiratory function stable and patient connected to nasal cannula oxygen Cardiovascular status: blood pressure returned to baseline and stable Postop Assessment: no apparent nausea or vomiting Anesthetic complications: no     Last Vitals:  Vitals:   04/25/18 1515 04/25/18 1516  BP:  122/66  Pulse: 67 67  Resp: 16 15  Temp:    SpO2: 98% 99%    Last Pain:  Vitals:   04/25/18 1515  TempSrc:   PainSc: 0-No pain                 Durenda Hurt

## 2018-04-27 ENCOUNTER — Encounter: Payer: Self-pay | Admitting: Gastroenterology

## 2018-04-28 ENCOUNTER — Ambulatory Visit (INDEPENDENT_AMBULATORY_CARE_PROVIDER_SITE_OTHER): Payer: BLUE CROSS/BLUE SHIELD | Admitting: Obstetrics and Gynecology

## 2018-04-28 ENCOUNTER — Encounter: Payer: Self-pay | Admitting: Obstetrics and Gynecology

## 2018-04-28 VITALS — BP 108/64 | HR 65 | Ht 64.0 in | Wt 145.9 lb

## 2018-04-28 DIAGNOSIS — G7109 Other specified muscular dystrophies: Secondary | ICD-10-CM

## 2018-04-28 DIAGNOSIS — Z860100 Personal history of colon polyps, unspecified: Secondary | ICD-10-CM

## 2018-04-28 DIAGNOSIS — A630 Anogenital (venereal) warts: Secondary | ICD-10-CM

## 2018-04-28 DIAGNOSIS — Z780 Asymptomatic menopausal state: Secondary | ICD-10-CM | POA: Diagnosis not present

## 2018-04-28 DIAGNOSIS — G71039 Limb girdle muscular dystrophy, unspecified: Secondary | ICD-10-CM

## 2018-04-28 DIAGNOSIS — Z01419 Encounter for gynecological examination (general) (routine) without abnormal findings: Secondary | ICD-10-CM

## 2018-04-28 LAB — SURGICAL PATHOLOGY

## 2018-04-28 NOTE — Patient Instructions (Addendum)
1.  Pap smear is not done.  Next Pap smear is due 2021. 2.  Mammogram is ordered. 3.  Stool guaiac cards are not given.  Colonoscopy on 04/25/2018 revealed benign polyps, pathology pending. 4.  Screening labs are obtained through primary care-Dr. Ginette Pitman. 5.  Recommend calcium and vitamin D supplementation twice a day 6.  Smoking cessation encouraged 7.  Return in 1 year for annual exam-Dr. Amalia Hailey 8.  Follow-up with dermatology for anal condyloma removal as scheduled   Health Maintenance for Postmenopausal Women Menopause is a normal process in which your reproductive ability comes to an end. This process happens gradually over a span of months to years, usually between the ages of 81 and 50. Menopause is complete when you have missed 12 consecutive menstrual periods. It is important to talk with your health care provider about some of the most common conditions that affect postmenopausal women, such as heart disease, cancer, and bone loss (osteoporosis). Adopting a healthy lifestyle and getting preventive care can help to promote your health and wellness. Those actions can also lower your chances of developing some of these common conditions. What should I know about menopause? During menopause, you may experience a number of symptoms, such as:  Moderate-to-severe hot flashes.  Night sweats.  Decrease in sex drive.  Mood swings.  Headaches.  Tiredness.  Irritability.  Memory problems.  Insomnia.  Choosing to treat or not to treat menopausal changes is an individual decision that you make with your health care provider. What should I know about hormone replacement therapy and supplements? Hormone therapy products are effective for treating symptoms that are associated with menopause, such as hot flashes and night sweats. Hormone replacement carries certain risks, especially as you become older. If you are thinking about using estrogen or estrogen with progestin treatments, discuss the  benefits and risks with your health care provider. What should I know about heart disease and stroke? Heart disease, heart attack, and stroke become more likely as you age. This may be due, in part, to the hormonal changes that your body experiences during menopause. These can affect how your body processes dietary fats, triglycerides, and cholesterol. Heart attack and stroke are both medical emergencies. There are many things that you can do to help prevent heart disease and stroke:  Have your blood pressure checked at least every 1-2 years. High blood pressure causes heart disease and increases the risk of stroke.  If you are 63-30 years old, ask your health care provider if you should take aspirin to prevent a heart attack or a stroke.  Do not use any tobacco products, including cigarettes, chewing tobacco, or electronic cigarettes. If you need help quitting, ask your health care provider.  It is important to eat a healthy diet and maintain a healthy weight. ? Be sure to include plenty of vegetables, fruits, low-fat dairy products, and lean protein. ? Avoid eating foods that are high in solid fats, added sugars, or salt (sodium).  Get regular exercise. This is one of the most important things that you can do for your health. ? Try to exercise for at least 150 minutes each week. The type of exercise that you do should increase your heart rate and make you sweat. This is known as moderate-intensity exercise. ? Try to do strengthening exercises at least twice each week. Do these in addition to the moderate-intensity exercise.  Know your numbers.Ask your health care provider to check your cholesterol and your blood glucose. Continue to have  your blood tested as directed by your health care provider.  What should I know about cancer screening? There are several types of cancer. Take the following steps to reduce your risk and to catch any cancer development as early as possible. Breast  Cancer  Practice breast self-awareness. ? This means understanding how your breasts normally appear and feel. ? It also means doing regular breast self-exams. Let your health care provider know about any changes, no matter how small.  If you are 55 or older, have a clinician do a breast exam (clinical breast exam or CBE) every year. Depending on your age, family history, and medical history, it may be recommended that you also have a yearly breast X-ray (mammogram).  If you have a family history of breast cancer, talk with your health care provider about genetic screening.  If you are at high risk for breast cancer, talk with your health care provider about having an MRI and a mammogram every year.  Breast cancer (BRCA) gene test is recommended for women who have family members with BRCA-related cancers. Results of the assessment will determine the need for genetic counseling and BRCA1 and for BRCA2 testing. BRCA-related cancers include these types: ? Breast. This occurs in males or females. ? Ovarian. ? Tubal. This may also be called fallopian tube cancer. ? Cancer of the abdominal or pelvic lining (peritoneal cancer). ? Prostate. ? Pancreatic.  Cervical, Uterine, and Ovarian Cancer Your health care provider may recommend that you be screened regularly for cancer of the pelvic organs. These include your ovaries, uterus, and vagina. This screening involves a pelvic exam, which includes checking for microscopic changes to the surface of your cervix (Pap test).  For women ages 21-65, health care providers may recommend a pelvic exam and a Pap test every three years. For women ages 24-65, they may recommend the Pap test and pelvic exam, combined with testing for human papilloma virus (HPV), every five years. Some types of HPV increase your risk of cervical cancer. Testing for HPV may also be done on women of any age who have unclear Pap test results.  Other health care providers may not  recommend any screening for nonpregnant women who are considered low risk for pelvic cancer and have no symptoms. Ask your health care provider if a screening pelvic exam is right for you.  If you have had past treatment for cervical cancer or a condition that could lead to cancer, you need Pap tests and screening for cancer for at least 20 years after your treatment. If Pap tests have been discontinued for you, your risk factors (such as having a new sexual partner) need to be reassessed to determine if you should start having screenings again. Some women have medical problems that increase the chance of getting cervical cancer. In these cases, your health care provider may recommend that you have screening and Pap tests more often.  If you have a family history of uterine cancer or ovarian cancer, talk with your health care provider about genetic screening.  If you have vaginal bleeding after reaching menopause, tell your health care provider.  There are currently no reliable tests available to screen for ovarian cancer.  Lung Cancer Lung cancer screening is recommended for adults 68-56 years old who are at high risk for lung cancer because of a history of smoking. A yearly low-dose CT scan of the lungs is recommended if you:  Currently smoke.  Have a history of at least 30 pack-years of  smoking and you currently smoke or have quit within the past 15 years. A pack-year is smoking an average of one pack of cigarettes per day for one year.  Yearly screening should:  Continue until it has been 15 years since you quit.  Stop if you develop a health problem that would prevent you from having lung cancer treatment.  Colorectal Cancer  This type of cancer can be detected and can often be prevented.  Routine colorectal cancer screening usually begins at age 46 and continues through age 75.  If you have risk factors for colon cancer, your health care provider may recommend that you be screened  at an earlier age.  If you have a family history of colorectal cancer, talk with your health care provider about genetic screening.  Your health care provider may also recommend using home test kits to check for hidden blood in your stool.  A small camera at the end of a tube can be used to examine your colon directly (sigmoidoscopy or colonoscopy). This is done to check for the earliest forms of colorectal cancer.  Direct examination of the colon should be repeated every 5-10 years until age 74. However, if early forms of precancerous polyps or small growths are found or if you have a family history or genetic risk for colorectal cancer, you may need to be screened more often.  Skin Cancer  Check your skin from head to toe regularly.  Monitor any moles. Be sure to tell your health care provider: ? About any new moles or changes in moles, especially if there is a change in a mole's shape or color. ? If you have a mole that is larger than the size of a pencil eraser.  If any of your family members has a history of skin cancer, especially at a young age, talk with your health care provider about genetic screening.  Always use sunscreen. Apply sunscreen liberally and repeatedly throughout the day.  Whenever you are outside, protect yourself by wearing long sleeves, pants, a wide-brimmed hat, and sunglasses.  What should I know about osteoporosis? Osteoporosis is a condition in which bone destruction happens more quickly than new bone creation. After menopause, you may be at an increased risk for osteoporosis. To help prevent osteoporosis or the bone fractures that can happen because of osteoporosis, the following is recommended:  If you are 66-45 years old, get at least 1,000 mg of calcium and at least 600 mg of vitamin D per day.  If you are older than age 69 but younger than age 20, get at least 1,200 mg of calcium and at least 600 mg of vitamin D per day.  If you are older than age 70,  get at least 1,200 mg of calcium and at least 800 mg of vitamin D per day.  Smoking and excessive alcohol intake increase the risk of osteoporosis. Eat foods that are rich in calcium and vitamin D, and do weight-bearing exercises several times each week as directed by your health care provider. What should I know about how menopause affects my mental health? Depression may occur at any age, but it is more common as you become older. Common symptoms of depression include:  Low or sad mood.  Changes in sleep patterns.  Changes in appetite or eating patterns.  Feeling an overall lack of motivation or enjoyment of activities that you previously enjoyed.  Frequent crying spells.  Talk with your health care provider if you think that you are  experiencing depression. What should I know about immunizations? It is important that you get and maintain your immunizations. These include:  Tetanus, diphtheria, and pertussis (Tdap) booster vaccine.  Influenza every year before the flu season begins.  Pneumonia vaccine.  Shingles vaccine.  Your health care provider may also recommend other immunizations. This information is not intended to replace advice given to you by your health care provider. Make sure you discuss any questions you have with your health care provider. Document Released: 07/03/2005 Document Revised: 11/29/2015 Document Reviewed: 02/12/2015 Elsevier Interactive Patient Education  2018 [employer].

## 2018-05-02 DIAGNOSIS — E1165 Type 2 diabetes mellitus with hyperglycemia: Secondary | ICD-10-CM | POA: Diagnosis not present

## 2018-05-02 DIAGNOSIS — M170 Bilateral primary osteoarthritis of knee: Secondary | ICD-10-CM | POA: Diagnosis not present

## 2018-05-02 DIAGNOSIS — Z720 Tobacco use: Secondary | ICD-10-CM | POA: Diagnosis not present

## 2018-05-02 DIAGNOSIS — Z0000 Encounter for general adult medical examination without abnormal findings: Secondary | ICD-10-CM | POA: Diagnosis not present

## 2018-05-16 DIAGNOSIS — Z1231 Encounter for screening mammogram for malignant neoplasm of breast: Secondary | ICD-10-CM | POA: Diagnosis not present

## 2018-05-28 DIAGNOSIS — H5789 Other specified disorders of eye and adnexa: Secondary | ICD-10-CM | POA: Diagnosis not present

## 2018-05-28 DIAGNOSIS — L03213 Periorbital cellulitis: Secondary | ICD-10-CM | POA: Diagnosis not present

## 2018-05-28 DIAGNOSIS — H00012 Hordeolum externum right lower eyelid: Secondary | ICD-10-CM | POA: Diagnosis not present

## 2018-06-16 DIAGNOSIS — I498 Other specified cardiac arrhythmias: Secondary | ICD-10-CM | POA: Diagnosis not present

## 2018-06-16 DIAGNOSIS — E1121 Type 2 diabetes mellitus with diabetic nephropathy: Secondary | ICD-10-CM | POA: Diagnosis not present

## 2018-06-16 DIAGNOSIS — R079 Chest pain, unspecified: Secondary | ICD-10-CM | POA: Diagnosis not present

## 2018-06-16 DIAGNOSIS — I10 Essential (primary) hypertension: Secondary | ICD-10-CM | POA: Diagnosis not present

## 2018-06-28 DIAGNOSIS — R140 Abdominal distension (gaseous): Secondary | ICD-10-CM | POA: Diagnosis not present

## 2018-06-28 DIAGNOSIS — D369 Benign neoplasm, unspecified site: Secondary | ICD-10-CM | POA: Diagnosis not present

## 2018-06-28 DIAGNOSIS — K210 Gastro-esophageal reflux disease with esophagitis: Secondary | ICD-10-CM | POA: Diagnosis not present

## 2018-07-18 DIAGNOSIS — A630 Anogenital (venereal) warts: Secondary | ICD-10-CM | POA: Diagnosis not present

## 2018-07-18 DIAGNOSIS — D485 Neoplasm of uncertain behavior of skin: Secondary | ICD-10-CM | POA: Diagnosis not present

## 2018-09-26 DIAGNOSIS — R8290 Unspecified abnormal findings in urine: Secondary | ICD-10-CM | POA: Diagnosis not present

## 2018-09-26 DIAGNOSIS — Z1389 Encounter for screening for other disorder: Secondary | ICD-10-CM | POA: Diagnosis not present

## 2018-09-26 DIAGNOSIS — Z720 Tobacco use: Secondary | ICD-10-CM | POA: Diagnosis not present

## 2018-09-26 DIAGNOSIS — M170 Bilateral primary osteoarthritis of knee: Secondary | ICD-10-CM | POA: Diagnosis not present

## 2018-09-26 DIAGNOSIS — Z0000 Encounter for general adult medical examination without abnormal findings: Secondary | ICD-10-CM | POA: Diagnosis not present

## 2018-09-26 DIAGNOSIS — E1165 Type 2 diabetes mellitus with hyperglycemia: Secondary | ICD-10-CM | POA: Diagnosis not present

## 2018-10-03 DIAGNOSIS — E1165 Type 2 diabetes mellitus with hyperglycemia: Secondary | ICD-10-CM | POA: Diagnosis not present

## 2018-10-03 DIAGNOSIS — E039 Hypothyroidism, unspecified: Secondary | ICD-10-CM | POA: Diagnosis not present

## 2018-10-03 DIAGNOSIS — M170 Bilateral primary osteoarthritis of knee: Secondary | ICD-10-CM | POA: Diagnosis not present

## 2018-10-03 DIAGNOSIS — Z720 Tobacco use: Secondary | ICD-10-CM | POA: Diagnosis not present

## 2018-10-04 DIAGNOSIS — A630 Anogenital (venereal) warts: Secondary | ICD-10-CM | POA: Diagnosis not present

## 2018-10-04 DIAGNOSIS — D2239 Melanocytic nevi of other parts of face: Secondary | ICD-10-CM | POA: Diagnosis not present

## 2018-10-04 DIAGNOSIS — L853 Xerosis cutis: Secondary | ICD-10-CM | POA: Diagnosis not present

## 2018-10-04 DIAGNOSIS — L820 Inflamed seborrheic keratosis: Secondary | ICD-10-CM | POA: Diagnosis not present

## 2018-10-10 DIAGNOSIS — G2581 Restless legs syndrome: Secondary | ICD-10-CM | POA: Diagnosis not present

## 2018-11-09 DIAGNOSIS — E559 Vitamin D deficiency, unspecified: Secondary | ICD-10-CM | POA: Diagnosis not present

## 2018-11-09 DIAGNOSIS — E611 Iron deficiency: Secondary | ICD-10-CM | POA: Diagnosis not present

## 2018-11-09 DIAGNOSIS — E538 Deficiency of other specified B group vitamins: Secondary | ICD-10-CM | POA: Diagnosis not present

## 2018-11-10 DIAGNOSIS — R200 Anesthesia of skin: Secondary | ICD-10-CM | POA: Diagnosis not present

## 2018-11-10 DIAGNOSIS — G4733 Obstructive sleep apnea (adult) (pediatric): Secondary | ICD-10-CM | POA: Diagnosis not present

## 2018-11-10 DIAGNOSIS — R202 Paresthesia of skin: Secondary | ICD-10-CM | POA: Diagnosis not present

## 2018-11-10 DIAGNOSIS — G2581 Restless legs syndrome: Secondary | ICD-10-CM | POA: Diagnosis not present

## 2018-12-20 DIAGNOSIS — Z720 Tobacco use: Secondary | ICD-10-CM | POA: Diagnosis not present

## 2018-12-20 DIAGNOSIS — I10 Essential (primary) hypertension: Secondary | ICD-10-CM | POA: Diagnosis not present

## 2018-12-20 DIAGNOSIS — R079 Chest pain, unspecified: Secondary | ICD-10-CM | POA: Diagnosis not present

## 2018-12-20 DIAGNOSIS — E1121 Type 2 diabetes mellitus with diabetic nephropathy: Secondary | ICD-10-CM | POA: Diagnosis not present

## 2019-01-31 DIAGNOSIS — R8290 Unspecified abnormal findings in urine: Secondary | ICD-10-CM | POA: Diagnosis not present

## 2019-01-31 DIAGNOSIS — M170 Bilateral primary osteoarthritis of knee: Secondary | ICD-10-CM | POA: Diagnosis not present

## 2019-01-31 DIAGNOSIS — G7109 Other specified muscular dystrophies: Secondary | ICD-10-CM | POA: Diagnosis not present

## 2019-01-31 DIAGNOSIS — E039 Hypothyroidism, unspecified: Secondary | ICD-10-CM | POA: Diagnosis not present

## 2019-01-31 DIAGNOSIS — Z720 Tobacco use: Secondary | ICD-10-CM | POA: Diagnosis not present

## 2019-01-31 DIAGNOSIS — E1165 Type 2 diabetes mellitus with hyperglycemia: Secondary | ICD-10-CM | POA: Diagnosis not present

## 2019-02-17 ENCOUNTER — Other Ambulatory Visit: Payer: Self-pay

## 2019-02-17 ENCOUNTER — Emergency Department
Admission: EM | Admit: 2019-02-17 | Discharge: 2019-02-17 | Disposition: A | Discharge status: Home / Self Care | Payer: 59 | Attending: Emergency Medicine | Admitting: Emergency Medicine

## 2019-02-17 ENCOUNTER — Emergency Department: Payer: 59

## 2019-02-17 DIAGNOSIS — Z79899 Other long term (current) drug therapy: Secondary | ICD-10-CM | POA: Insufficient documentation

## 2019-02-17 DIAGNOSIS — G43109 Migraine with aura, not intractable, without status migrainosus: Secondary | ICD-10-CM | POA: Insufficient documentation

## 2019-02-17 DIAGNOSIS — E119 Type 2 diabetes mellitus without complications: Secondary | ICD-10-CM | POA: Diagnosis not present

## 2019-02-17 DIAGNOSIS — I10 Essential (primary) hypertension: Secondary | ICD-10-CM | POA: Insufficient documentation

## 2019-02-17 DIAGNOSIS — R4701 Aphasia: Secondary | ICD-10-CM | POA: Diagnosis present

## 2019-02-17 DIAGNOSIS — E039 Hypothyroidism, unspecified: Secondary | ICD-10-CM | POA: Diagnosis not present

## 2019-02-17 DIAGNOSIS — Z7984 Long term (current) use of oral hypoglycemic drugs: Secondary | ICD-10-CM | POA: Diagnosis not present

## 2019-02-17 DIAGNOSIS — R4789 Other speech disturbances: Secondary | ICD-10-CM | POA: Diagnosis not present

## 2019-02-17 DIAGNOSIS — F17210 Nicotine dependence, cigarettes, uncomplicated: Secondary | ICD-10-CM | POA: Diagnosis not present

## 2019-02-17 LAB — COMPREHENSIVE METABOLIC PANEL
ALT: 23 U/L (ref 0–44)
AST: 27 U/L (ref 15–41)
Albumin: 4.6 g/dL (ref 3.5–5.0)
Alkaline Phosphatase: 73 U/L (ref 38–126)
Anion gap: 18 — ABNORMAL HIGH (ref 5–15)
BUN: 17 mg/dL (ref 6–20)
CO2: 25 mmol/L (ref 22–32)
Calcium: 9.9 mg/dL (ref 8.9–10.3)
Chloride: 91 mmol/L — ABNORMAL LOW (ref 98–111)
Creatinine, Ser: 0.49 mg/dL (ref 0.44–1.00)
GFR calc Af Amer: >60 mL/min (ref >60)
GFR calc non Af Amer: >60 mL/min (ref >60)
Glucose, Bld: 101 mg/dL — ABNORMAL HIGH (ref 70–99)
Potassium: 4.2 mmol/L (ref 3.5–5.1)
Sodium: 134 mmol/L — ABNORMAL LOW (ref 135–145)
Total Bilirubin: 0.7 mg/dL (ref 0.3–1.2)
Total Protein: 8.4 g/dL — ABNORMAL HIGH (ref 6.5–8.1)

## 2019-02-17 LAB — DIFFERENTIAL
Abs Immature Granulocytes: 0.04 10*3/uL (ref 0.00–0.07)
Basophils Absolute: 0.1 10*3/uL (ref 0.0–0.1)
Basophils Relative: 1 %
Eosinophils Absolute: 0.1 10*3/uL (ref 0.0–0.5)
Eosinophils Relative: 1 %
Immature Granulocytes: 0 %
Lymphocytes Relative: 34 %
Lymphs Abs: 3.6 10*3/uL (ref 0.7–4.0)
Monocytes Absolute: 0.8 10*3/uL (ref 0.1–1.0)
Monocytes Relative: 7 %
Neutro Abs: 6.1 10*3/uL (ref 1.7–7.7)
Neutrophils Relative %: 57 %

## 2019-02-17 LAB — CBC
HCT: 47.4 % — ABNORMAL HIGH (ref 36.0–46.0)
Hemoglobin: 15.5 g/dL — ABNORMAL HIGH (ref 12.0–15.0)
MCH: 31.1 pg (ref 26.0–34.0)
MCHC: 32.7 g/dL (ref 30.0–36.0)
MCV: 95.2 fL (ref 80.0–100.0)
Platelets: 309 10*3/uL (ref 150–400)
RBC: 4.98 MIL/uL (ref 3.87–5.11)
RDW: 13.2 % (ref 11.5–15.5)
WBC: 10.6 10*3/uL — ABNORMAL HIGH (ref 4.0–10.5)
nRBC: 0 % (ref 0.0–0.2)

## 2019-02-17 LAB — GLUCOSE, CAPILLARY: Glucose-Capillary: 91 mg/dL (ref 70–99)

## 2019-02-17 MED ORDER — SODIUM CHLORIDE 0.9% FLUSH: 3.0000 mL | Freq: Once | INTRAVENOUS | Status: DC

## 2019-02-17 NOTE — ED Notes (Signed)
Pt significant other Mark: [phone]

## 2019-02-17 NOTE — ED Notes (Signed)
This RN and pt/family currently talking with teleneuro staff.

## 2019-02-17 NOTE — Consult Note (Signed)
[facility]urology Consult Services   Date of Service:   02/17/2019 17:54:54  Impression:     .  Complex migraine  Comments/Sign-Out: Patient has a history of ocular migraine. She had squiggly lines in her vision before the onset of speech disturbance and then it recurred without the speech disturbance and she had a headache. Noting she does have risk factors I do not think she had a stroke. She needs to see local neurologist for migraine prevention.  Metrics: Last Known Well: 02/17/2019 15:15:00 [facility] Notification Time: 02/17/2019 17:54:54 Arrival Time: 02/17/2019 17:18:00 Stamp Time: 02/17/2019 17:54:54 Time First Login Attempt: 02/17/2019 17:58:58 Video Start Time: 02/17/2019 17:58:58  Symptoms: Loss of speech NIHSS Start Assessment Time: 02/17/2019 18:05:00 Patient is not a candidate for Alteplase/Activase. Patient was not deemed candidate for Alteplase/Activase thrombolytics because of Resolved symptoms (no residual disabling symptoms). Video End Time: 02/17/2019 18:16:00  CT head showed no acute hemorrhage or acute core infarct. CT head was reviewed and results were: Chronic small vessel disease but no evidence of acute ischemic change, hemorrhage, or mass lesion.  Clinical Presentation is not Suggestive of Large Vessel Occlusive Disease  Radiologist was not called back for review of advanced imaging because Not indicated ED Physician notified of diagnostic impression and management plan on 02/17/2019 18:15:00  Our recommendations are outlined below.  Recommendations:     .  Activate Stroke Protocol Admission/Order Set     .  Stroke/Telemetry Floor     .  Neuro Checks     .  Bedside Swallow Eval     .  DVT Prophylaxis     .  IV Fluids, Normal Saline     .  Head of Bed 30 Degrees     .  Euglycemia and Avoid Hyperthermia (PRN Acetaminophen)     .  She does need to reduce her risk factors for circulatory disease including blood  pressure control, diabetic control, statin, and smoking cessation.    Sign Out:     .  Discussed with Emergency Department Provider    ------------------------------------------------------------------------------  History of Present Illness: Patient is a 58 year old Female.  Patient was brought by private transportation with symptoms of Loss of speech  This is a 58 year old woman who had an episode of squiggly lines in her vision consistent with her history of ocular migraine without headache. This was followed by a 15 second episode of being unable to get her words out. It all went away than the vision part recurred and stop. She does have some headache now. 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  Last seen normal was within 4.5 hours. There is no history of hemorrhagic complications or intracranial hemorrhage. There is no history of Recent Anticoagulants. There is no history of recent major surgery. There is no history of recent stroke.  Past Medical History:     . Hypertension     . Diabetes Mellitus     . Hyperlipidemia     . There is NO history of Atrial Fibrillation     . There is NO history of Coronary Artery Disease     . There is NO history of Stroke  Anticoagulant use:  No  Antiplatelet use: No  Examination: BP(177/75), Pulse(73), Blood Glucose(101) 1A: Level of Consciousness - Alert; keenly responsive + 0 1B: Ask Month and Age - Both Questions Right + 0 1C: Blink Eyes & Squeeze Hands - Performs Both Tasks + 0 2: Test Horizontal Extraocular Movements -  Normal + 0 3: Test Visual Fields - No Visual Loss + 0 4: Test Facial Palsy (Use Grimace if Obtunded) - Normal symmetry + 0 5A: Test Left Arm Motor Drift - No Drift for 10 Seconds + 0 5B: Test Right Arm Motor Drift - No Drift for 10 Seconds + 0 6A: Test Left Leg Motor Drift - No Drift for 5 Seconds + 0 6B: Test Right Leg Motor Drift - No Drift for 5 Seconds + 0 7: Test Limb Ataxia  (FNF/Heel-Shin) - No Ataxia + 0 8: Test Sensation - Normal; No sensory loss + 0 9: Test Language/Aphasia - Normal; No aphasia + 0 10: Test Dysarthria - Normal + 0 11: Test Extinction/Inattention - No abnormality + 0  NIHSS Score: 0  Patient/Family was informed the Neurology Consult would happen via TeleHealth consult by way of interactive audio and video telecommunications and consented to receiving care in this manner.   Due to the immediate potential for life-threatening deterioration due to underlying acute neurologic illness, I spent 32 minutes providing critical care. This time includes time for face to face visit via telemedicine, review of medical records, imaging studies and discussion of findings with providers, the patient and/or family.   Dr Cindie Laroche   [facility] [phone]  Case [id number]

## 2019-02-17 NOTE — ED Notes (Signed)
Dr Jacqualine Code and Dr Lucia Gaskins speaking at bedside.

## 2019-02-17 NOTE — ED Notes (Signed)
Verbal from EDP Quale to hold off on obtaining 2nd blue tube.

## 2019-02-17 NOTE — ED Notes (Addendum)
EDP Quale at bedside. CT staff at bedside. This RN will further assess pt once back from CT. Visitor at bedside. Pt currently alert and speech sounds clear. Bloodwork collected during triage.

## 2019-02-17 NOTE — ED Notes (Addendum)
Will place IV and recollect blue tube per lab request once neuro assessment by physician Lucia Gaskins complete. Will complete full NIHSS and swallow screen at this time as well.

## 2019-02-17 NOTE — ED Notes (Signed)
Neurologist speaking with pt now.

## 2019-02-17 NOTE — ED Triage Notes (Signed)
Pt states around 1500 she noticed squiggily lines in her vision and was attempting to talk and the only word she could say was truck, states this last approx 30 sec to a minute, then took a shower and the lines returned. Pt states at this time she is back to normal

## 2019-02-17 NOTE — ED Notes (Signed)
Teleneuro cart to bedside. Will have ready for pt once returned to room.

## 2019-02-17 NOTE — ED Provider Notes (Signed)
[facility] Emergency Department Provider Note   ____________________________________________   First MD Initiated Contact with Patient 02/17/19 1731     (approximate)  I have reviewed the triage vital signs and the nursing notes.   HISTORY  Chief Complaint Aphasia    HPI Andrea Rogers is a 58 y.o. female here for evaluation for speech change  Just a little bit after 3 PM today she started to notice that she was having a blurring in her vision, reports she has had this similar but seem more prominent than of prior "ocular migraine".  She then developed several minutes of trouble finding words, sometimes saying inappropriate words.  Her family brought her here for evaluation.  She reports that she is now developed a mild frontal headache with this as well.  Normal state of health.  No recent illness.  No fevers or chills.  No chest pain or shortness of breath.  Denies any numbness or weakness anywhere in the body other than her typical weakness of some of her girdle muscles related to muscular dystrophy   Past Medical History:  Diagnosis Date  . Anal condyloma   . Arthritis   . Diabetes mellitus without complication ([place])   . HLD (hyperlipidemia)   . Hypertension   . MD (muscular dystrophy) ([place])   . Menopause   . 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 Osteopenia   . Recurrent UTI   . Thyroid disease   . Tobacco abuse     Patient Active Problem List   Diagnosis Date Noted  . Tobacco abuse 03/23/2016  . GAD (generalized anxiety disorder) 03/09/2016  . Acquired hypothyroidism 10/02/2015  . Essential hypertension 03/26/2015  . Heart palpitations 03/26/2015  . Pedal edema 03/26/2015  . Dysphagia 03/14/2015  . Diabetes mellitus type 2, controlled ([place]) 05/31/2014  . 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 COLONOSCOPY WITH PROPOFOL N/A 04/25/2018   Procedure: COLONOSCOPY WITH  PROPOFOL;  Surgeon: Lollie Sails, MD;  Location: [facility] ENDOSCOPY;  Service: Endoscopy;  Laterality: N/A;  . NO PAST SURGERIES      Prior to Admission medications   Medication Sig Start Date End Date Taking? Authorizing Provider  albuterol (VENTOLIN HFA) 108 (90 Base) MCG/ACT inhaler Inhale 2 puffs into the lungs every 6 (six) hours as needed for wheezing or shortness of breath.    Yes [provider]  ALPRAZolam (XANAX) 0.25 MG tablet Take 0.25 mg by mouth at bedtime as needed for anxiety or sleep.    Yes [provider]  atenolol (TENORMIN) 100 MG tablet Take 100 mg by mouth 2 (two) times daily.    Yes [provider]  cyclobenzaprine (FLEXERIL) 10 MG tablet Take 5-10 mg by mouth daily as needed for muscle spasms.    Yes [provider]  ezetimibe (ZETIA) 10 MG tablet Take 10 mg by mouth daily.    Yes [provider]  gabapentin (NEURONTIN) 300 MG capsule Take 300-600 mg by mouth See admin instructions. Take 1 capsule (300mg ) by mouth twice daily and take 2 capsules (600mg ) by mouth at bedtime 02/07/19  Yes [provider]  glimepiride (AMARYL) 1 MG tablet Take 1 mg by mouth daily.    Yes [provider]  levothyroxine (SYNTHROID, LEVOTHROID) 75 MCG tablet Take 75 mcg by mouth daily before breakfast.    Yes [provider]  metFORMIN (GLUCOPHAGE) 500 MG tablet Take 1,000 mg by  mouth 2 (two) times daily with a meal.    Yes [provider]  omeprazole (PRILOSEC) 40 MG capsule Take 40 mg by mouth daily. 02/06/19  Yes [provider]  triamterene-hydrochlorothiazide (MAXZIDE) 75-50 MG tablet Take 0.5 tablets by mouth daily.    Yes [provider]    Allergies Latex and Penicillins  Family History  Adopted: Yes  Problem Relation Age of Onset  . Breast cancer Mother   . Colon cancer Father   . Diabetes Father     Social History Social History   Tobacco Use  . Smoking status: Current  Every Day Smoker    Packs/day: 1.00    Types: Cigarettes  . Smokeless tobacco: Never Used  Substance Use Topics  . Alcohol use: No  . Drug use: No    Review of Systems Constitutional: No fever/chills Eyes: Vision completely improved now, symptoms of visual change went away about 15 minutes after they started ENT: No sore throat. Cardiovascular: Denies chest pain. Respiratory: Denies shortness of breath. Gastrointestinal: No abdominal pain.   Genitourinary: Negative for dysuria. Musculoskeletal: Negative for back pain. Skin: Negative for rash. Neurological: Negative for areas of focal weakness or numbness.    ____________________________________________   PHYSICAL EXAM:  VITAL SIGNS: ED Triage Vitals  Enc Vitals Group     BP 02/17/19 1714 (!) 177/75     Pulse Rate 02/17/19 1714 73     Resp 02/17/19 1714 16     Temp 02/17/19 1714 98.2 F (36.8 C)     Temp Source 02/17/19 1714 Oral     SpO2 02/17/19 1714 98 %     Weight 02/17/19 1715 133 lb (60.3 kg)     Height 02/17/19 1715 5\' 4"  (1.626 m)     Head Circumference --      Peak Flow --      Pain Score 02/17/19 1714 5     Pain Loc --      Pain Edu? --      Excl. in [place]? --     Constitutional: Alert and oriented. Well appearing and in no acute distress. Eyes: Conjunctivae are normal. Head: Atraumatic. Nose: No congestion/rhinnorhea. Mouth/Throat: Mucous membranes are moist. Neck: No stridor.  Cardiovascular: Normal rate, regular rhythm. Grossly normal heart sounds.  Good peripheral circulation. Respiratory: Normal respiratory effort.  No retractions. Lungs CTAB. Gastrointestinal: Soft and nontender. No distention. Musculoskeletal: No lower extremity tenderness nor edema. Neurologic:  Normal speech and language. No gross focal neurologic deficits are appreciated. Current NIH score 0 at 1745.  Normal visual fields.  Normal extraocular movements.  No discernible weakness in any extremity, perhaps just slightly compared  to normal but she reports its same for her normal muscular dystrophy. Skin:  Skin is warm, dry and intact. No rash noted. Psychiatric: Mood and affect are normal. Speech and behavior are normal.  ____________________________________________   LABS (all labs ordered are listed, but only abnormal results are displayed)  Labs Reviewed  CBC - Abnormal; Notable for the following components:      Result Value   WBC 10.6 (*)    Hemoglobin 15.5 (*)    HCT 47.4 (*)    All other components within normal limits  COMPREHENSIVE METABOLIC PANEL - Abnormal; Notable for the following components:   Sodium 134 (*)    Chloride 91 (*)    Glucose, Bld 101 (*)    Total Protein 8.4 (*)    Anion gap 18 (*)    All other components  within normal limits  GLUCOSE, CAPILLARY  DIFFERENTIAL   ____________________________________________  EKG  Reviewed entered by me at 1720 Heart rate 75 QRS 60 QTc 440 Normal sinus rhythm, no evidence acute ischemia. ____________________________________________  RADIOLOGY  Ct Head Wo Contrast  Result Date: 02/17/2019 CLINICAL DATA:  Speech difficulty EXAM: CT HEAD WITHOUT CONTRAST TECHNIQUE: Contiguous axial images were obtained from the base of the skull through the vertex without intravenous contrast. COMPARISON:  None. FINDINGS: Brain: No acute intracranial abnormality. Specifically, no hemorrhage, hydrocephalus, mass lesion, acute infarction, or significant intracranial injury. Mild chronic small vessel disease throughout the deep white matter. Vascular: No hyperdense vessel or unexpected calcification. Skull: No acute calvarial abnormality. Sinuses/Orbits: Visualized paranasal sinuses and mastoids clear. Orbital soft tissues unremarkable. Other: None IMPRESSION: Chronic small vessel changes throughout the deep white matter. No acute intracranial abnormality. Electronically Signed   By: Rolm Baptise M.D.   On: 02/17/2019 17:46    CT head negative for acute finding  ____________________________________________   PROCEDURES  Procedure(s) performed: None  Procedures  Critical Care performed: Yes, see critical care note(s)  CRITICAL CARE Performed by: Delman Kitten   Total critical care time: 25 minutes  Critical care time was exclusive of separately billable procedures and treating other patients.  Critical care was necessary to treat or prevent imminent or life-threatening deterioration.  Critical care was time spent personally by me on the following activities: development of treatment plan with patient and/or surrogate as well as nursing, discussions with consultants, evaluation of patient's response to treatment, examination of patient, obtaining history from patient or surrogate, ordering and performing treatments and interventions, ordering and review of laboratory studies, ordering and review of radiographic studies, pulse oximetry and re-evaluation of patient's condition.  ____________________________________________   INITIAL IMPRESSION / ASSESSMENT AND PLAN / ED COURSE  Pertinent labs & imaging results that were available during my care of the patient were reviewed by me and considered in my medical decision making (see chart for details).   Patient was for evaluation of visual changes followed by difficulty word finding.  Now having a slight headache.  I suspect based on her clinical history that this likely represents some type of a migraine headache as she has a history of ocular migraine with similar visual symptoms as well.  She reports and appears to have improvement in her speech now and has no hard findings or gross abnormalities on neuro examination.  Head CT is performed and is normal.  I have involved in activated as code stroke given her acute symptomatology and to receive stat neurology consultation as she did have expressive symptoms associated at one point.    ----------------------------------------- 6:26 PM on 02/17/2019  -----------------------------------------  Discussed case with Dr. Lucia Gaskins of neurology.  Also discussed with the patient, appears consistent with complex migraine.  Patient neurologic symptoms including difficulty speaking are all resolved now, reports just a very mild headache but does not wish for any medication for it she would like to be discharged.  Return precautions and treatment recommendations and follow-up discussed with the patient who is agreeable with the plan.   ____________________________________________   FINAL CLINICAL IMPRESSION(S) / ED DIAGNOSES  Final diagnoses:  Migraine with aura and without status migrainosus, not intractable        Note:  This document was prepared using Dragon voice recognition software and may include unintentional dictation errors       Delman Kitten, MD 02/17/19 1827

## 2019-02-20 DIAGNOSIS — G459 Transient cerebral ischemic attack, unspecified: Secondary | ICD-10-CM | POA: Diagnosis not present

## 2019-02-20 DIAGNOSIS — G7109 Other specified muscular dystrophies: Secondary | ICD-10-CM | POA: Diagnosis not present

## 2019-02-23 DIAGNOSIS — G459 Transient cerebral ischemic attack, unspecified: Secondary | ICD-10-CM

## 2019-02-23 DIAGNOSIS — I639 Cerebral infarction, unspecified: Secondary | ICD-10-CM

## 2019-02-23 HISTORY — DX: Cerebral infarction, unspecified: I63.9

## 2019-02-23 HISTORY — DX: Transient cerebral ischemic attack, unspecified: G45.9

## 2019-02-24 ENCOUNTER — Other Ambulatory Visit: Payer: Self-pay | Admitting: Neurology

## 2019-02-24 DIAGNOSIS — G459 Transient cerebral ischemic attack, unspecified: Secondary | ICD-10-CM

## 2019-02-27 DIAGNOSIS — E1121 Type 2 diabetes mellitus with diabetic nephropathy: Secondary | ICD-10-CM | POA: Diagnosis not present

## 2019-02-27 DIAGNOSIS — I498 Other specified cardiac arrhythmias: Secondary | ICD-10-CM | POA: Diagnosis not present

## 2019-02-27 DIAGNOSIS — R002 Palpitations: Secondary | ICD-10-CM | POA: Diagnosis not present

## 2019-02-27 DIAGNOSIS — I10 Essential (primary) hypertension: Secondary | ICD-10-CM | POA: Diagnosis not present

## 2019-02-27 DIAGNOSIS — G459 Transient cerebral ischemic attack, unspecified: Secondary | ICD-10-CM | POA: Insufficient documentation

## 2019-03-03 ENCOUNTER — Ambulatory Visit
Admission: RE | Admit: 2019-03-03 | Discharge: 2019-03-03 | Disposition: A | Discharge status: Home / Self Care | Payer: 59 | Source: Ambulatory Visit | Attending: Neurology | Admitting: Neurology

## 2019-03-03 ENCOUNTER — Other Ambulatory Visit: Payer: Self-pay

## 2019-03-03 DIAGNOSIS — G459 Transient cerebral ischemic attack, unspecified: Secondary | ICD-10-CM | POA: Insufficient documentation

## 2019-03-03 DIAGNOSIS — H539 Unspecified visual disturbance: Secondary | ICD-10-CM | POA: Diagnosis not present

## 2019-03-09 DIAGNOSIS — I6523 Occlusion and stenosis of bilateral carotid arteries: Secondary | ICD-10-CM | POA: Diagnosis not present

## 2019-03-09 DIAGNOSIS — G459 Transient cerebral ischemic attack, unspecified: Secondary | ICD-10-CM | POA: Diagnosis not present

## 2019-03-13 DIAGNOSIS — E1121 Type 2 diabetes mellitus with diabetic nephropathy: Secondary | ICD-10-CM | POA: Diagnosis not present

## 2019-03-13 DIAGNOSIS — G459 Transient cerebral ischemic attack, unspecified: Secondary | ICD-10-CM | POA: Diagnosis not present

## 2019-03-13 DIAGNOSIS — I10 Essential (primary) hypertension: Secondary | ICD-10-CM | POA: Diagnosis not present

## 2019-03-13 DIAGNOSIS — I498 Other specified cardiac arrhythmias: Secondary | ICD-10-CM | POA: Diagnosis not present

## 2019-03-26 NOTE — Progress Notes (Signed)
MRN : [id number]  Andrea Rogers is a 58 y.o. ([birth year]) female who presents with chief complaint of No chief complaint on file. Marland Kitchen  History of Present Illness:   The patient is seen for evaluation of carotid stenosis. The carotid stenosis was identified after she experienced visual changes of the right eye and dysarthria.  Initially this was thought to be an occular migraine but now there are concerns that it was a true TIA.  She is left handed.  The patient denies recent amaurosis fugax. There is no recent history of TIA symptoms or focal motor deficits. There is no prior documented CVA.  There is a history of migraine headaches. There is no history of seizures.  The patient is taking enteric-coated aspirin 81 mg daily.  The patient has a history of coronary artery disease, no recent episodes of angina or shortness of breath. The patient denies PAD or claudication symptoms. There is a history of hyperlipidemia which is being treated with a statin.    No outpatient medications have been marked as taking for the 03/27/19 encounter (Appointment) with Delana Meyer, Dolores Lory, MD.    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[place])    Osteopenia    Recurrent UTI    Thyroid disease    Tobacco abuse     Past Surgical History:  Procedure Laterality Date   COLONOSCOPY WITH PROPOFOL N/A 04/25/2018   Procedure: COLONOSCOPY WITH PROPOFOL;  Surgeon: Lollie Sails, MD;  Location: [facility] ENDOSCOPY;  Service: Endoscopy;  Laterality: N/A;   NO PAST SURGERIES      Social History Social History   Tobacco Use   Smoking status: Current Every Day Smoker    Packs/day: 1.00    Types: Cigarettes   Smokeless tobacco: Never Used  Substance Use Topics   Alcohol use: No   Drug  use: No    Family History Family History  Adopted: Yes  Problem Relation Age of Onset   Breast cancer Mother    Colon cancer Father    Diabetes Father   No family history of bleeding/clotting disorders, porphyria or autoimmune disease   Allergies  Allergen Reactions   Latex    Penicillins Other (See Comments)    Yeast infection     REVIEW OF SYSTEMS (Negative unless checked)  Constitutional: [] Weight loss  [] Fever  [] Chills Cardiac: [] Chest pain   [] Chest pressure   [] Palpitations   [] Shortness of breath when laying flat   [] Shortness of breath with exertion. Vascular:  [] Pain in legs with walking   [] Pain in legs at rest  [] History of DVT   [] Phlebitis   [] Swelling in legs   [] Varicose veins   [] Non-healing ulcers Pulmonary:   [] Uses home oxygen   [] Productive cough   [] Hemoptysis   [] Wheeze  [] COPD   [] Asthma Neurologic:  [] Dizziness   [] Seizures   [] History of stroke   [x] History of TIA  [x] Aphasia   [x] Vissual changes   [] Weakness or numbness in arm   [] Weakness or numbness in leg Musculoskeletal:   [] Joint swelling   [] Joint pain   [] Low back pain Hematologic:  [] Easy bruising  [] Easy bleeding   [] Hypercoagulable state   [] Anemic Gastrointestinal:  [] Diarrhea   [] Vomiting  [] Gastroesophageal reflux/heartburn   []   Difficulty swallowing. Genitourinary:  [] Chronic kidney disease   [] Difficult urination  [] Frequent urination   [] Blood in urine Skin:  [] Rashes   [] Ulcers  Psychological:  [] History of anxiety   []  History of major depression.  Physical Examination  There were no vitals filed for this visit. There is no height or weight on file to calculate BMI. Gen: WD/WN, NAD Head: [place]/AT, No temporalis wasting.  Ear/Nose/Throat: Hearing grossly intact, nares w/o erythema or drainage, poor dentition Eyes: PER, EOMI, sclera nonicteric.  Neck: Supple, no masses.  No bruit or JVD.  Pulmonary:  Good air movement, clear to auscultation bilaterally, no use of accessory  muscles.  Cardiac: RRR, normal S1, S2, no Murmurs. Vascular: no carotid bruit Vessel Right Left  Radial Palpable Palpable  Brachial Palpable Palpable  Carotid Palpable Palpable  Gastrointestinal: soft, non-distended. No guarding/no peritoneal signs.  Musculoskeletal: M/S 5/5 throughout.  No deformity or atrophy.  Neurologic: CN 2-12 intact. Pain and light touch intact in extremities.  Symmetrical.  Speech is fluent. Motor exam as listed above. Psychiatric: Judgment intact, Mood & affect appropriate for pt's clinical situation. Dermatologic: No rashes or ulcers noted.  No changes consistent with cellulitis. Lymph : No Cervical lymphadenopathy, no lichenification or skin changes of chronic lymphedema.  CBC Lab Results  Component Value Date   WBC 10.6 (H) 02/17/2019   HGB 15.5 (H) 02/17/2019   HCT 47.4 (H) 02/17/2019   MCV 95.2 02/17/2019   PLT 309 02/17/2019    BMET    Component Value Date/Time   NA 134 (L) 02/17/2019 1718   K 4.2 02/17/2019 1718   CL 91 (L) 02/17/2019 1718   CO2 25 02/17/2019 1718   GLUCOSE 101 (H) 02/17/2019 1718   BUN 17 02/17/2019 1718   CREATININE 0.49 02/17/2019 1718   CALCIUM 9.9 02/17/2019 1718   GFRNONAA >60 02/17/2019 1718   GFRAA >60 02/17/2019 1718   CrCl cannot be calculated (Patient's most recent lab result is older than the maximum 21 days allowed.).  COAG No results found for: INR, PROTIME  Radiology Mr Angio Head Wo Contrast  Result Date: 03/04/2019 CLINICAL DATA:  Visual disturbance beginning 2 weeks ago. Episode of aphasia. EXAM: MRI HEAD WITHOUT CONTRAST MRA HEAD WITHOUT CONTRAST TECHNIQUE: Multiplanar, multiecho pulse sequences of the brain and surrounding structures were obtained without intravenous contrast. Angiographic images of the head were obtained using MRA technique without contrast. COMPARISON:  Head CT 02/17/2019 FINDINGS: MRI HEAD FINDINGS Brain: Diffusion imaging does not show any acute or subacute infarction. The  brainstem and cerebellum are normal. Cerebral hemispheres show patchy and confluent abnormal T2 and FLAIR signal within the basal ganglia and deep white matter most consistent with chronic moderate advanced small-vessel ischemic change of the brain for a person of this age. No cortical or large vessel territory infarction. No mass lesion, hemorrhage, hydrocephalus or extra-axial collection. Vascular: Major vessels at the base of the brain show flow. Skull and upper cervical spine: Negative Sinuses/Orbits: Mucosal thickening and opacification of the left maxillary sinus which could be symptomatic. Other sinuses are clear. Orbits negative. Other: None MRA HEAD FINDINGS Both internal carotid arteries are patent through the skull base and siphon regions. Upper cervical ICA on the left is tortuous, which could be due to chronic hypertension or fibromuscular disease. The anterior and middle cerebral vessels are patent. Right ICA supplies the middle cerebral artery and posterior cerebral artery. Left ICA supplies the left middle cerebral artery and both anterior cerebral arteries. No evidence of intracranial  occlusion or correctable proximal stenosis. Both vertebral arteries are widely patent to the basilar with the left being dominant. No basilar stenosis. Posterior circulation branch vessels show flow. IMPRESSION: No acute brain finding. Moderate chronic small-vessel ischemic changes of the basal ganglia and hemispheric deep white matter. No large or medium vessel occlusion. No aneurysm. Tortuous appearance of the left ICA beneath the skull base could relate to chronic hypertension or fibromuscular disease. Electronically Signed   By: Nelson Chimes M.D.   On: 03/04/2019 13:15   Mr Brain Wo Contrast  Result Date: 03/04/2019 CLINICAL DATA:  Visual disturbance beginning 2 weeks ago. Episode of aphasia. EXAM: MRI HEAD WITHOUT CONTRAST MRA HEAD WITHOUT CONTRAST TECHNIQUE: Multiplanar, multiecho pulse sequences of the  brain and surrounding structures were obtained without intravenous contrast. Angiographic images of the head were obtained using MRA technique without contrast. COMPARISON:  Head CT 02/17/2019 FINDINGS: MRI HEAD FINDINGS Brain: Diffusion imaging does not show any acute or subacute infarction. The brainstem and cerebellum are normal. Cerebral hemispheres show patchy and confluent abnormal T2 and FLAIR signal within the basal ganglia and deep white matter most consistent with chronic moderate advanced small-vessel ischemic change of the brain for a person of this age. No cortical or large vessel territory infarction. No mass lesion, hemorrhage, hydrocephalus or extra-axial collection. Vascular: Major vessels at the base of the brain show flow. Skull and upper cervical spine: Negative Sinuses/Orbits: Mucosal thickening and opacification of the left maxillary sinus which could be symptomatic. Other sinuses are clear. Orbits negative. Other: None MRA HEAD FINDINGS Both internal carotid arteries are patent through the skull base and siphon regions. Upper cervical ICA on the left is tortuous, which could be due to chronic hypertension or fibromuscular disease. The anterior and middle cerebral vessels are patent. Right ICA supplies the middle cerebral artery and posterior cerebral artery. Left ICA supplies the left middle cerebral artery and both anterior cerebral arteries. No evidence of intracranial occlusion or correctable proximal stenosis. Both vertebral arteries are widely patent to the basilar with the left being dominant. No basilar stenosis. Posterior circulation branch vessels show flow. IMPRESSION: No acute brain finding. Moderate chronic small-vessel ischemic changes of the basal ganglia and hemispheric deep white matter. No large or medium vessel occlusion. No aneurysm. Tortuous appearance of the left ICA beneath the skull base could relate to chronic hypertension or fibromuscular disease. Electronically Signed    By: Nelson Chimes M.D.   On: 03/04/2019 13:15     Assessment/Plan 1. TIA (transient ischemic attack) The patient remains asymptomatic with respect to the carotid stenosis.  However, the patient has now progressed and has a lesion the is >70%.  Patient should undergo CT angiography of the carotid arteries to define the degree of stenosis of the internal carotid arteries bilaterally and the anatomic suitability for surgery vs. intervention.  If the patient does indeed need surgery cardiac clearance will be required, once cleared the patient will be scheduled for surgery.  The risks, benefits and alternative therapies were reviewed in detail with the patient.  All questions were answered.  The patient agrees to proceed with imaging.  Continue antiplatelet therapy as prescribed. Continue management of CAD, HTN and Hyperlipidemia. Healthy heart diet, encouraged exercise at least 4 times per week.   - CT ANGIO NECK W OR WO CONTRAST; Future  2. Bilateral carotid artery stenosis The patient remains asymptomatic with respect to the carotid stenosis.  However, the patient has now progressed and has a lesion the is >70%.  Patient should undergo CT angiography of the carotid arteries to define the degree of stenosis of the internal carotid arteries bilaterally and the anatomic suitability for surgery vs. intervention.  If the patient does indeed need surgery cardiac clearance will be required, once cleared the patient will be scheduled for surgery.  The risks, benefits and alternative therapies were reviewed in detail with the patient.  All questions were answered.  The patient agrees to proceed with imaging.  Continue antiplatelet therapy as prescribed. Continue management of CAD, HTN and Hyperlipidemia. Healthy heart diet, encouraged exercise at least 4 times per week.   - CT ANGIO NECK W OR WO CONTRAST; Future  3. Essential hypertension Continue antihypertensive medications as already  ordered, these medications have been reviewed and there are no changes at this time.   4. Controlled type 2 diabetes mellitus with other circulatory complication, without long-term current use of insulin (HCC) Continue hypoglycemic medications as already ordered, these medications have been reviewed and there are no changes at this time.  Hgb A1C to be monitored as already arranged by primary service   5. Mixed hyperlipidemia Continue statin as ordered and reviewed, no changes at this time     Hortencia Pilar, MD  03/26/2019 12:05 PM

## 2019-03-27 ENCOUNTER — Ambulatory Visit (INDEPENDENT_AMBULATORY_CARE_PROVIDER_SITE_OTHER): Payer: 59 | Admitting: Vascular Surgery

## 2019-03-27 ENCOUNTER — Encounter (INDEPENDENT_AMBULATORY_CARE_PROVIDER_SITE_OTHER): Payer: Self-pay | Admitting: Vascular Surgery

## 2019-03-27 ENCOUNTER — Other Ambulatory Visit: Payer: Self-pay

## 2019-03-27 VITALS — BP 151/77 | HR 68 | Wt 136.2 lb

## 2019-03-27 DIAGNOSIS — G459 Transient cerebral ischemic attack, unspecified: Secondary | ICD-10-CM | POA: Diagnosis not present

## 2019-03-27 DIAGNOSIS — I10 Essential (primary) hypertension: Secondary | ICD-10-CM | POA: Diagnosis not present

## 2019-03-27 DIAGNOSIS — I6523 Occlusion and stenosis of bilateral carotid arteries: Secondary | ICD-10-CM

## 2019-03-27 DIAGNOSIS — E1159 Type 2 diabetes mellitus with other circulatory complications: Secondary | ICD-10-CM | POA: Diagnosis not present

## 2019-03-27 DIAGNOSIS — E782 Mixed hyperlipidemia: Secondary | ICD-10-CM | POA: Diagnosis not present

## 2019-03-29 ENCOUNTER — Encounter (INDEPENDENT_AMBULATORY_CARE_PROVIDER_SITE_OTHER): Payer: Self-pay | Admitting: Vascular Surgery

## 2019-03-29 DIAGNOSIS — I6529 Occlusion and stenosis of unspecified carotid artery: Secondary | ICD-10-CM | POA: Insufficient documentation

## 2019-03-31 ENCOUNTER — Encounter (INDEPENDENT_AMBULATORY_CARE_PROVIDER_SITE_OTHER): Payer: Self-pay

## 2019-04-12 ENCOUNTER — Telehealth (INDEPENDENT_AMBULATORY_CARE_PROVIDER_SITE_OTHER): Payer: Self-pay

## 2019-04-12 NOTE — Telephone Encounter (Signed)
She should hold her metformin for 2 days after the procedure but continue BP meds.

## 2019-04-12 NOTE — Telephone Encounter (Signed)
Can the patient take her medication before her radiology appt. Metformin and blood pressure.

## 2019-04-12 NOTE — Telephone Encounter (Signed)
Spoke with the patient and gave her Ronette Deter recommendations. See notes below.

## 2019-04-13 ENCOUNTER — Other Ambulatory Visit: Payer: Self-pay

## 2019-04-13 ENCOUNTER — Ambulatory Visit
Admission: RE | Admit: 2019-04-13 | Discharge: 2019-04-13 | Disposition: A | Discharge status: Home / Self Care | Payer: 59 | Source: Ambulatory Visit | Attending: Vascular Surgery | Admitting: Vascular Surgery

## 2019-04-13 DIAGNOSIS — I6523 Occlusion and stenosis of bilateral carotid arteries: Secondary | ICD-10-CM | POA: Diagnosis not present

## 2019-04-13 DIAGNOSIS — G459 Transient cerebral ischemic attack, unspecified: Secondary | ICD-10-CM | POA: Diagnosis not present

## 2019-04-13 LAB — POCT I-STAT CREATININE: Creatinine, Ser: 0.4 mg/dL — ABNORMAL LOW (ref 0.44–1.00)

## 2019-04-13 MED ORDER — IOHEXOL 350 MG/ML SOLN
75.0000 mL | Freq: Once | INTRAVENOUS | Status: AC | PRN
  Administered 2019-04-13: 75 mL via INTRAVENOUS

## 2019-04-18 ENCOUNTER — Encounter (INDEPENDENT_AMBULATORY_CARE_PROVIDER_SITE_OTHER): Payer: Self-pay

## 2019-04-19 ENCOUNTER — Encounter (INDEPENDENT_AMBULATORY_CARE_PROVIDER_SITE_OTHER): Payer: Self-pay

## 2019-05-01 ENCOUNTER — Encounter: Payer: BLUE CROSS/BLUE SHIELD | Admitting: Obstetrics and Gynecology

## 2019-05-03 ENCOUNTER — Encounter: Payer: BLUE CROSS/BLUE SHIELD | Admitting: Obstetrics and Gynecology

## 2019-05-08 ENCOUNTER — Other Ambulatory Visit: Payer: Self-pay

## 2019-05-08 ENCOUNTER — Encounter (INDEPENDENT_AMBULATORY_CARE_PROVIDER_SITE_OTHER): Payer: Self-pay | Admitting: Vascular Surgery

## 2019-05-08 ENCOUNTER — Ambulatory Visit (INDEPENDENT_AMBULATORY_CARE_PROVIDER_SITE_OTHER): Payer: 59 | Admitting: Vascular Surgery

## 2019-05-08 VITALS — BP 170/80 | HR 69 | Resp 16 | Wt 135.4 lb

## 2019-05-08 DIAGNOSIS — G71039 Limb girdle muscular dystrophy, unspecified: Secondary | ICD-10-CM

## 2019-05-08 DIAGNOSIS — G459 Transient cerebral ischemic attack, unspecified: Secondary | ICD-10-CM

## 2019-05-08 DIAGNOSIS — E1159 Type 2 diabetes mellitus with other circulatory complications: Secondary | ICD-10-CM

## 2019-05-08 DIAGNOSIS — I10 Essential (primary) hypertension: Secondary | ICD-10-CM

## 2019-05-08 DIAGNOSIS — G7109 Other specified muscular dystrophies: Secondary | ICD-10-CM

## 2019-05-08 DIAGNOSIS — I6523 Occlusion and stenosis of bilateral carotid arteries: Secondary | ICD-10-CM

## 2019-05-08 NOTE — Progress Notes (Signed)
MRN : [id number]  Andrea Rogers is a 58 y.o. (June 07, 1960) female who presents with chief complaint of  Chief Complaint  Patient presents with  . Follow-up    ct results  .  History of Present Illness:   The patient is seen for follow up evaluation of carotid stenosis status post CT angiogram. Patient reports that the test went well with no problems or complications.   The patient denies interval amaurosis fugax. There is no recent or interval TIA symptoms or focal motor deficits. There is no prior documented CVA.  The patient is taking enteric-coated aspirin 81 mg daily.  There is no history of migraine headaches. There is no history of seizures.  The patient has a history of coronary artery disease, no recent episodes of angina or shortness of breath. The patient denies PAD or claudication symptoms. There is a history of hyperlipidemia which is being treated with a statin.    CT angiogram is reviewed by me personally and shows >70% stenosis consistent with calcified plaque at the origin of the right internal carotid artery.   Current Meds  Medication Sig  . albuterol (VENTOLIN HFA) 108 (90 Base) MCG/ACT inhaler Inhale 2 puffs into the lungs every 6 (six) hours as needed for wheezing or shortness of breath.   . ALPRAZolam (XANAX) 0.25 MG tablet Take 0.25 mg by mouth at bedtime as needed for anxiety or sleep.   Marland Kitchen atenolol (TENORMIN) 100 MG tablet Take 100 mg by mouth 2 (two) times daily.   . cyclobenzaprine (FLEXERIL) 10 MG tablet Take 5-10 mg by mouth daily as needed for muscle spasms.   Marland Kitchen ezetimibe (ZETIA) 10 MG tablet Take 10 mg by mouth daily.   Marland Kitchen gabapentin (NEURONTIN) 300 MG capsule Take 300-600 mg by mouth See admin instructions. Take 1 capsule (300mg ) by mouth twice daily and take 2 capsules (600mg ) by mouth at bedtime  . glimepiride (AMARYL) 1 MG tablet Take 1 mg by mouth daily.   Marland Kitchen levothyroxine (SYNTHROID, LEVOTHROID) 75 MCG tablet Take 75 mcg by mouth daily before  breakfast.   . metFORMIN (GLUCOPHAGE) 500 MG tablet Take 1,000 mg by mouth 2 (two) times daily with a meal.   . omeprazole (PRILOSEC) 40 MG capsule Take 40 mg by mouth daily.  Marland Kitchen triamterene-hydrochlorothiazide (MAXZIDE) 75-50 MG tablet Take 0.5 tablets by mouth daily.     Past Medical History:  Diagnosis Date  . Anal condyloma   . Arthritis   . Diabetes mellitus without complication ([place])   . HLD (hyperlipidemia)   . Hypertension   . MD (muscular dystrophy) ([place])   . Menopause   . 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 Osteopenia   . Recurrent UTI   . Thyroid disease   . Tobacco abuse     Past Surgical History:  Procedure Laterality Date  . COLONOSCOPY WITH PROPOFOL N/A 04/25/2018   Procedure: COLONOSCOPY WITH PROPOFOL;  Surgeon: Lollie Sails, MD;  Location: [facility] ENDOSCOPY;  Service: Endoscopy;  Laterality: N/A;  . NO PAST SURGERIES      Social History Social History   Tobacco Use  . Smoking status: Current Every Day Smoker    Packs/day: 1.00    Types: Cigarettes  . Smokeless tobacco: Never Used  Substance Use Topics  . Alcohol use: No  . Drug use: No    Family History Family History  Adopted: Yes  Problem Relation Age of Onset  . Breast cancer Mother   . Colon cancer  Father   . Diabetes Father     Allergies  Allergen Reactions  . Latex   . Penicillins Other (See Comments)    Yeast infection     REVIEW OF SYSTEMS (Negative unless checked)  Constitutional: [] Weight loss  [] Fever  [] Chills Cardiac: [] Chest pain   [] Chest pressure   [] Palpitations   [] Shortness of breath when laying flat   [] Shortness of breath with exertion. Vascular:  [] Pain in legs with walking   [] Pain in legs at rest  [] History of DVT   [] Phlebitis   [] Swelling in legs   [] Varicose veins   [] Non-healing ulcers Pulmonary:   [] Uses home oxygen   [] Productive cough   [] Hemoptysis   [] Wheeze  [] COPD   [] Asthma Neurologic:  [] Dizziness   [] Seizures   [] History of stroke    [] History of TIA  [] Aphasia   [] Vissual changes   [] Weakness or numbness in arm   [] Weakness or numbness in leg Musculoskeletal:   [] Joint swelling   [] Joint pain   [] Low back pain Hematologic:  [] Easy bruising  [] Easy bleeding   [] Hypercoagulable state   [] Anemic Gastrointestinal:  [] Diarrhea   [] Vomiting  [] Gastroesophageal reflux/heartburn   [] Difficulty swallowing. Genitourinary:  [] Chronic kidney disease   [] Difficult urination  [] Frequent urination   [] Blood in urine Skin:  [] Rashes   [] Ulcers  Psychological:  [] History of anxiety   []  History of major depression.  Physical Examination  Vitals:   05/08/19 1616  BP: (!) 170/80  Pulse: 69  Resp: 16  Weight: 135 lb 6.4 oz (61.4 kg)   Body mass index is 23.24 kg/m. Gen: WD/WN, NAD Head: [place]/AT, No temporalis wasting.  Ear/Nose/Throat: Hearing grossly intact, nares w/o erythema or drainage Eyes: PER, EOMI, sclera nonicteric.  Neck: Supple, no large masses.   Pulmonary:  Good air movement, no audible wheezing bilaterally, no use of accessory muscles.  Cardiac: RRR, no JVD Vascular: right carotid bruit Vessel Right Left  Radial Palpable Palpable  Carotid Palpable Palpable  Gastrointestinal: Non-distended. No guarding/no peritoneal signs.  Musculoskeletal: M/S 5/5 throughout.  No deformity or atrophy.  Neurologic: CN 2-12 intact. Symmetrical.  Speech is fluent. Motor exam as listed above. Psychiatric: Judgment intact, Mood & affect appropriate for pt's clinical situation. Dermatologic: No rashes or ulcers noted.  No changes consistent with cellulitis. Lymph : No lichenification or skin changes of chronic lymphedema.  CBC Lab Results  Component Value Date   WBC 10.6 (H) 02/17/2019   HGB 15.5 (H) 02/17/2019   HCT 47.4 (H) 02/17/2019   MCV 95.2 02/17/2019   PLT 309 02/17/2019    BMET    Component Value Date/Time   NA 134 (L) 02/17/2019 1718   K 4.2 02/17/2019 1718   CL 91 (L) 02/17/2019 1718   CO2 25 02/17/2019 1718    GLUCOSE 101 (H) 02/17/2019 1718   BUN 17 02/17/2019 1718   CREATININE 0.40 (L) 04/13/2019 1132   CALCIUM 9.9 02/17/2019 1718   GFRNONAA >60 02/17/2019 1718   GFRAA >60 02/17/2019 1718   CrCl cannot be calculated (Patient's most recent lab result is older than the maximum 21 days allowed.).  COAG No results found for: INR, PROTIME  Radiology CT ANGIO NECK W OR WO CONTRAST  Result Date: 04/13/2019 CLINICAL DATA:  Carotid stenosis.  History of visual disturbance. EXAM: CT ANGIOGRAPHY NECK TECHNIQUE: Multidetector CT imaging of the neck was performed using the standard protocol during bolus administration of intravenous contrast. Multiplanar CT image reconstructions and MIPs were obtained to evaluate  the vascular anatomy. Carotid stenosis measurements (when applicable) are obtained utilizing NASCET criteria, using the distal internal carotid diameter as the denominator. CONTRAST:  36mL OMNIPAQUE IOHEXOL 350 MG/ML SOLN COMPARISON:  MRI head and MRA head 03/03/2019 FINDINGS: Aortic arch: Mild atherosclerotic disease in the aortic arch and proximal great vessels. No significant stenosis or aneurysm. Right carotid system: Calcified plaque at the right carotid bifurcation. Right internal carotid artery lumen narrowed to 1.2 mm, corresponding to 70% diameter stenosis. Mild tortuosity of the right internal carotid artery. Left carotid system: Atherosclerotic calcification left carotid bifurcation. Minimal luminal diameter of the left internal carotid artery is 3.6 mm, corresponding to 30% diameter stenosis. Moderate tortuosity of the left internal carotid artery below the skull base without evidence of FMD or aneurysm. Vertebral arteries: Both vertebral arteries patent to the basilar without stenosis. Skeleton: No acute skeletal abnormality. Other neck: Negative for mass or adenopathy in the neck. Mucosal edema paranasal sinuses. Complete opacification left maxillary sinus. This is unchanged from the prior MRI  brain. Upper chest: Lung apices clear bilaterally. IMPRESSION: 70% diameter stenosis proximal right internal carotid artery due to calcified plaque 30% diameter stenosis proximal left internal carotid artery due to calcified plaque Both vertebral arteries widely patent in the neck Sinus mucosal disease. Electronically Signed   By: Franchot Gallo M.D.   On: 04/13/2019 15:08     Assessment/Plan 1. TIA (transient ischemic attack) Recommend:  The patient has a history of TIA related to the carotid stenosis.  The patient has now progressed and has a lesion the is >75%.  Patient's CT angiography of the carotid arteries confirms >75% right ICA stenosis.  The anatomical considerations support surgery over stenting but her co-morbidities would support stenting.  This was discussed in detail with the patient.  The patient does indeed need surgery, therefore, cardiac clearance will be arranged. Once cleared the patient will be scheduled for surgery.  The risks, benefits and alternative therapies were reviewed in detail with the patient.  All questions were answered.  The patient agrees to proceed with surgery of the right carotid artery.  Continue antiplatelet therapy as prescribed. Continue management of CAD, HTN and Hyperlipidemia. Healthy heart diet, encouraged exercise at least 4 times per week.    2. Bilateral carotid artery stenosis See #1  3. Essential hypertension Continue antihypertensive medications as already ordered, these medications have been reviewed and there are no changes at this time.   4. Controlled type 2 diabetes mellitus with other circulatory complication, without long-term current use of insulin (HCC) Continue hypoglycemic medications as already ordered, these medications have been reviewed and there are no changes at this time.  Hgb A1C to be monitored as already arranged by primary service   5. 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

## 2019-05-12 ENCOUNTER — Encounter: Payer: BLUE CROSS/BLUE SHIELD | Admitting: Obstetrics and Gynecology

## 2019-06-04 ENCOUNTER — Encounter (INDEPENDENT_AMBULATORY_CARE_PROVIDER_SITE_OTHER): Payer: Self-pay | Admitting: Vascular Surgery

## 2019-06-14 ENCOUNTER — Ambulatory Visit: Payer: Managed Care, Other (non HMO) | Attending: Internal Medicine

## 2019-06-14 DIAGNOSIS — Z20822 Contact with and (suspected) exposure to covid-19: Secondary | ICD-10-CM | POA: Diagnosis not present

## 2019-06-15 LAB — NOVEL CORONAVIRUS, NAA: SARS-CoV-2, NAA: NOT DETECTED

## 2019-06-21 DIAGNOSIS — G459 Transient cerebral ischemic attack, unspecified: Secondary | ICD-10-CM | POA: Diagnosis not present

## 2019-06-21 DIAGNOSIS — E559 Vitamin D deficiency, unspecified: Secondary | ICD-10-CM | POA: Diagnosis not present

## 2019-06-21 DIAGNOSIS — E538 Deficiency of other specified B group vitamins: Secondary | ICD-10-CM | POA: Diagnosis not present

## 2019-06-21 DIAGNOSIS — G7109 Other specified muscular dystrophies: Secondary | ICD-10-CM | POA: Diagnosis not present

## 2019-06-22 DIAGNOSIS — I10 Essential (primary) hypertension: Secondary | ICD-10-CM | POA: Diagnosis not present

## 2019-06-22 DIAGNOSIS — Z01810 Encounter for preprocedural cardiovascular examination: Secondary | ICD-10-CM | POA: Insufficient documentation

## 2019-06-22 DIAGNOSIS — R002 Palpitations: Secondary | ICD-10-CM | POA: Diagnosis not present

## 2019-06-22 DIAGNOSIS — G459 Transient cerebral ischemic attack, unspecified: Secondary | ICD-10-CM | POA: Diagnosis not present

## 2019-07-20 ENCOUNTER — Encounter (INDEPENDENT_AMBULATORY_CARE_PROVIDER_SITE_OTHER): Payer: Self-pay

## 2019-07-20 NOTE — Telephone Encounter (Signed)
Please advise 

## 2019-07-21 ENCOUNTER — Encounter (INDEPENDENT_AMBULATORY_CARE_PROVIDER_SITE_OTHER): Payer: Self-pay

## 2019-07-21 ENCOUNTER — Telehealth (INDEPENDENT_AMBULATORY_CARE_PROVIDER_SITE_OTHER): Payer: Self-pay

## 2019-07-21 NOTE — Telephone Encounter (Signed)
Spoke with the patient and she is now scheduled with Dr. Delana Meyer for a RCEA surgery on 08/11/19. Patient will do a phone call pre-op on 08/08/19 between 8-1 pm and covid testing on 08/08/19 between 12:30-2:30 pm at the [facility]. Pre-surgical information was discussed and will be mailed to the patient.

## 2019-07-21 NOTE — Telephone Encounter (Signed)
Please discuss with Dr. Delana Meyer

## 2019-07-24 ENCOUNTER — Encounter (INDEPENDENT_AMBULATORY_CARE_PROVIDER_SITE_OTHER): Payer: Self-pay

## 2019-07-31 DIAGNOSIS — G7109 Other specified muscular dystrophies: Secondary | ICD-10-CM | POA: Diagnosis not present

## 2019-07-31 DIAGNOSIS — E790 Hyperuricemia without signs of inflammatory arthritis and tophaceous disease: Secondary | ICD-10-CM | POA: Diagnosis not present

## 2019-07-31 DIAGNOSIS — E039 Hypothyroidism, unspecified: Secondary | ICD-10-CM | POA: Diagnosis not present

## 2019-07-31 DIAGNOSIS — Z720 Tobacco use: Secondary | ICD-10-CM | POA: Diagnosis not present

## 2019-07-31 DIAGNOSIS — E1165 Type 2 diabetes mellitus with hyperglycemia: Secondary | ICD-10-CM | POA: Diagnosis not present

## 2019-07-31 DIAGNOSIS — I10 Essential (primary) hypertension: Secondary | ICD-10-CM | POA: Diagnosis not present

## 2019-08-07 ENCOUNTER — Other Ambulatory Visit (INDEPENDENT_AMBULATORY_CARE_PROVIDER_SITE_OTHER): Payer: Self-pay | Admitting: Nurse Practitioner

## 2019-08-07 DIAGNOSIS — E1165 Type 2 diabetes mellitus with hyperglycemia: Secondary | ICD-10-CM | POA: Diagnosis not present

## 2019-08-07 DIAGNOSIS — I10 Essential (primary) hypertension: Secondary | ICD-10-CM | POA: Diagnosis not present

## 2019-08-07 DIAGNOSIS — I6521 Occlusion and stenosis of right carotid artery: Secondary | ICD-10-CM | POA: Diagnosis not present

## 2019-08-07 DIAGNOSIS — G459 Transient cerebral ischemic attack, unspecified: Secondary | ICD-10-CM | POA: Diagnosis not present

## 2019-08-07 DIAGNOSIS — G43109 Migraine with aura, not intractable, without status migrainosus: Secondary | ICD-10-CM | POA: Insufficient documentation

## 2019-08-07 DIAGNOSIS — E139 Other specified diabetes mellitus without complications: Secondary | ICD-10-CM | POA: Diagnosis not present

## 2019-08-08 ENCOUNTER — Encounter
Admission: RE | Admit: 2019-08-08 | Discharge: 2019-08-08 | Disposition: A | Discharge status: Home / Self Care | Payer: 59 | Source: Ambulatory Visit | Attending: Vascular Surgery | Admitting: Vascular Surgery

## 2019-08-08 ENCOUNTER — Other Ambulatory Visit: Payer: Self-pay

## 2019-08-08 DIAGNOSIS — Z01812 Encounter for preprocedural laboratory examination: Secondary | ICD-10-CM | POA: Insufficient documentation

## 2019-08-08 HISTORY — DX: Bronchitis, not specified as acute or chronic: J40

## 2019-08-08 HISTORY — DX: Hypothyroidism, unspecified: E03.9

## 2019-08-08 HISTORY — DX: Gastro-esophageal reflux disease without esophagitis: K21.9

## 2019-08-08 HISTORY — DX: Headache, unspecified: R51.9

## 2019-08-08 NOTE — Patient Instructions (Addendum)
Your procedure is scheduled on: Friday 3/19 Report to [facility]    Day surgery To find out your arrival time please call [phone] between [place] on Thurs 3/18  Remember: Instructions that are not followed completely may result in serious medical risk,  up to and including death, or upon the discretion of your surgeon and anesthesiologist your  surgery may need to be rescheduled.     _X__ 1. Do not eat food after midnight the night before your procedure.                 No gum chewing or hard candies. You may drink clear liquids up to 2 hours                 before you are scheduled to arrive for your surgery- DO not drink clear                 liquids within 2 hours of the start of your surgery.                 Clear Liquids include:  water, clear Gatorade, G2 or                  Gatorade Zero (avoid Red/Purple/Blue), Black Coffee or Tea (Do not add                 anything to coffee or tea). _____2.   Complete the carbohydrate drink provided to you, 2 hours before arrival.  __X__2.  On the morning of surgery brush your teeth with toothpaste and water, you                may rinse your mouth with mouthwash if you wish.  Do not swallow any toothpaste of mouthwash.     ___ 3.  No Alcohol for 24 hours before or after surgery.   _X__ 4.  Do Not Smoke or use e-cigarettes For 24 Hours Prior to Your Surgery.                 Do not use any chewable tobacco products for at least 6 hours prior to                 surgery.  ____  5.  Bring all medications with you on the day of surgery if instructed.   __x__  6.  Notify your doctor if there is any change in your medical condition      (cold, fever, infections).     Do not wear jewelry, make-up, hairpins, clips or nail polish. Do not wear lotions, powders, or perfumes. You may wear deodorant. Do not shave 48 hours prior to surgery. Men may shave face and neck. Do not bring valuables to the hospital.    [facility] is not responsible for any belongings or valuables.  Contacts, dentures or bridgework may not be worn into surgery. Leave your suitcase in the car. After surgery it may be brought to your room. For patients admitted to the hospital, discharge time is determined by your treatment team.   Patients discharged the day of surgery will not be allowed to drive home.   Make arrangements for someone to be with you for the first 24 hours of your Same Day Discharge.    Please read over the following fact sheets that you were given:    __x__ Take these medicines the morning of surgery with A SIP OF WATER:  1. atenolol (TENORMIN) 100 MG tablet  2. gabapentin (NEURONTIN) 300 MG capsule  3. omeprazole (PRILOSEC) 40 MG capsule  Dose the night before and the morning of surgery  4.  5.  6.  ____ Fleet Enema (as directed)   _x___ Use CHG Soap (or wipes) as directed  ____ Use Benzoyl Peroxide Gel as instructed  __x__ Use inhalers on the day of surgery albuterol (VENTOLIN HFA) 108 (90 Base) MCG/ACT inhaler and bring it with you  _x___ Stop metformin 2 days prior to surgery  Last dose the night of 3/16    ____ Take 1/2 of usual insulin dose the night before surgery. No insulin the morning          of surgery.   __x__ Continue aspirin   Do not take the morning of surgery   __x__ Stop Anti-inflammatories    ____ Stop supplements until after surgery.    ____ Bring C-Pap to the hospital.   Threasa Beards PAT [phone]  Or [phone]    With questions

## 2019-08-09 ENCOUNTER — Other Ambulatory Visit
Admission: RE | Admit: 2019-08-09 | Discharge: 2019-08-09 | Disposition: A | Discharge status: Home / Self Care | Payer: 59 | Source: Ambulatory Visit | Attending: Vascular Surgery | Admitting: Vascular Surgery

## 2019-08-09 DIAGNOSIS — Z01812 Encounter for preprocedural laboratory examination: Secondary | ICD-10-CM | POA: Insufficient documentation

## 2019-08-09 DIAGNOSIS — Z20822 Contact with and (suspected) exposure to covid-19: Secondary | ICD-10-CM | POA: Insufficient documentation

## 2019-08-09 LAB — TYPE AND SCREEN
ABO/RH(D): O POS
Antibody Screen: NEGATIVE

## 2019-08-09 LAB — PROTIME-INR
INR: 0.9 (ref 0.8–1.2)
Prothrombin Time: 12.3 seconds (ref 11.4–15.2)

## 2019-08-09 LAB — APTT: aPTT: 34 seconds (ref 24–36)

## 2019-08-09 LAB — SARS CORONAVIRUS 2 (TAT 6-24 HRS): SARS Coronavirus 2: NEGATIVE

## 2019-08-10 MED ORDER — CLINDAMYCIN PHOSPHATE 300 MG/50ML IV SOLN
300.0000 mg | INTRAVENOUS | Status: AC
  Administered 2019-08-11: 300 mg via INTRAVENOUS

## 2019-08-11 ENCOUNTER — Encounter
Admission: RE | Disposition: A | Discharge status: Home / Self Care | Payer: Self-pay | Source: Home / Self Care | Attending: Vascular Surgery

## 2019-08-11 ENCOUNTER — Encounter: Payer: Self-pay | Admitting: Vascular Surgery

## 2019-08-11 ENCOUNTER — Inpatient Hospital Stay: Payer: Medicare Other

## 2019-08-11 ENCOUNTER — Inpatient Hospital Stay
Admission: RE | Admit: 2019-08-11 | Discharge: 2019-08-12 | DRG: 039 | Disposition: A | Discharge status: Home / Self Care | Payer: Medicare Other | Attending: Vascular Surgery | Admitting: Vascular Surgery

## 2019-08-11 ENCOUNTER — Other Ambulatory Visit: Payer: Self-pay

## 2019-08-11 DIAGNOSIS — F17210 Nicotine dependence, cigarettes, uncomplicated: Secondary | ICD-10-CM | POA: Diagnosis present

## 2019-08-11 DIAGNOSIS — Z7982 Long term (current) use of aspirin: Secondary | ICD-10-CM | POA: Diagnosis not present

## 2019-08-11 DIAGNOSIS — I6523 Occlusion and stenosis of bilateral carotid arteries: Secondary | ICD-10-CM | POA: Diagnosis not present

## 2019-08-11 DIAGNOSIS — R69 Illness, unspecified: Secondary | ICD-10-CM | POA: Diagnosis not present

## 2019-08-11 DIAGNOSIS — I10 Essential (primary) hypertension: Secondary | ICD-10-CM | POA: Diagnosis not present

## 2019-08-11 DIAGNOSIS — Z833 Family history of diabetes mellitus: Secondary | ICD-10-CM

## 2019-08-11 DIAGNOSIS — Z803 Family history of malignant neoplasm of breast: Secondary | ICD-10-CM | POA: Diagnosis not present

## 2019-08-11 DIAGNOSIS — E119 Type 2 diabetes mellitus without complications: Secondary | ICD-10-CM | POA: Diagnosis not present

## 2019-08-11 DIAGNOSIS — Z800 Family history of malignant neoplasm of digestive organs: Secondary | ICD-10-CM | POA: Diagnosis not present

## 2019-08-11 DIAGNOSIS — E039 Hypothyroidism, unspecified: Secondary | ICD-10-CM | POA: Diagnosis not present

## 2019-08-11 DIAGNOSIS — K219 Gastro-esophageal reflux disease without esophagitis: Secondary | ICD-10-CM | POA: Diagnosis not present

## 2019-08-11 DIAGNOSIS — I2510 Atherosclerotic heart disease of native coronary artery without angina pectoris: Secondary | ICD-10-CM | POA: Diagnosis present

## 2019-08-11 DIAGNOSIS — Z7984 Long term (current) use of oral hypoglycemic drugs: Secondary | ICD-10-CM

## 2019-08-11 DIAGNOSIS — E785 Hyperlipidemia, unspecified: Secondary | ICD-10-CM | POA: Diagnosis not present

## 2019-08-11 DIAGNOSIS — Z87440 Personal history of urinary (tract) infections: Secondary | ICD-10-CM

## 2019-08-11 DIAGNOSIS — I6529 Occlusion and stenosis of unspecified carotid artery: Secondary | ICD-10-CM | POA: Diagnosis not present

## 2019-08-11 DIAGNOSIS — Z79899 Other long term (current) drug therapy: Secondary | ICD-10-CM | POA: Diagnosis not present

## 2019-08-11 DIAGNOSIS — Z20822 Contact with and (suspected) exposure to covid-19: Secondary | ICD-10-CM | POA: Diagnosis not present

## 2019-08-11 DIAGNOSIS — M8580 Other specified disorders of bone density and structure, unspecified site: Secondary | ICD-10-CM | POA: Diagnosis present

## 2019-08-11 DIAGNOSIS — I6521 Occlusion and stenosis of right carotid artery: Secondary | ICD-10-CM | POA: Diagnosis present

## 2019-08-11 DIAGNOSIS — G7109 Other specified muscular dystrophies: Secondary | ICD-10-CM | POA: Diagnosis present

## 2019-08-11 DIAGNOSIS — Z79890 Hormone replacement therapy (postmenopausal): Secondary | ICD-10-CM | POA: Diagnosis not present

## 2019-08-11 HISTORY — PX: ENDARTERECTOMY: SHX5162

## 2019-08-11 LAB — GLUCOSE, CAPILLARY
Glucose-Capillary: 161 mg/dL — ABNORMAL HIGH (ref 70–99)
Glucose-Capillary: 150 mg/dL — ABNORMAL HIGH (ref 70–99)
Glucose-Capillary: 170 mg/dL — ABNORMAL HIGH (ref 70–99)
Glucose-Capillary: 229 mg/dL — ABNORMAL HIGH (ref 70–99)
Glucose-Capillary: 297 mg/dL — ABNORMAL HIGH (ref 70–99)

## 2019-08-11 LAB — ABO/RH: ABO/RH(D): O POS

## 2019-08-11 LAB — HEMOGLOBIN A1C
Hgb A1c MFr Bld: 6.6 % — ABNORMAL HIGH (ref 4.8–5.6)
Mean Plasma Glucose: 142.72 mg/dL

## 2019-08-11 SURGERY — ENDARTERECTOMY, CAROTID
Anesthesia: General | Laterality: Right

## 2019-08-11 MED ORDER — CLINDAMYCIN PHOSPHATE 300 MG/50ML IV SOLN
INTRAVENOUS | Status: AC
  Filled 2019-08-11: qty 50

## 2019-08-11 MED ORDER — SODIUM CHLORIDE 0.9 % IV SOLN
INTRAVENOUS | Status: DC | PRN
  Administered 2019-08-11: .1 ug/kg/min via INTRAVENOUS

## 2019-08-11 MED ORDER — FENTANYL CITRATE (PF) 100 MCG/2ML IJ SOLN
INTRAMUSCULAR | Status: AC
  Filled 2019-08-11: qty 2

## 2019-08-11 MED ORDER — ALBUTEROL SULFATE (2.5 MG/3ML) 0.083% IN NEBU: 2.5000 mg | INHALATION_SOLUTION | Freq: Four times a day (QID) | RESPIRATORY_TRACT | Status: DC | PRN

## 2019-08-11 MED ORDER — CLINDAMYCIN PHOSPHATE 300 MG/50ML IV SOLN
300.0000 mg | Freq: Three times a day (TID) | INTRAVENOUS | Status: AC
  Administered 2019-08-11 (×2): 300 mg via INTRAVENOUS
  Filled 2019-08-11: qty 50

## 2019-08-11 MED ORDER — REMIFENTANIL HCL 1 MG IV SOLR
INTRAVENOUS | Status: AC
  Filled 2019-08-11: qty 1000

## 2019-08-11 MED ORDER — SODIUM CHLORIDE (PF) 0.9 % IJ SOLN
INTRAMUSCULAR | Status: AC
  Filled 2019-08-11: qty 50

## 2019-08-11 MED ORDER — SUGAMMADEX SODIUM 200 MG/2ML IV SOLN
INTRAVENOUS | Status: DC | PRN
  Administered 2019-08-11: 200 mg via INTRAVENOUS

## 2019-08-11 MED ORDER — LIDOCAINE HCL (CARDIAC) PF 100 MG/5ML IV SOSY
PREFILLED_SYRINGE | INTRAVENOUS | Status: DC | PRN
  Administered 2019-08-11: 60 mg via INTRAVENOUS
  Administered 2019-08-11: 40 mg via INTRAVENOUS

## 2019-08-11 MED ORDER — HEPARIN SODIUM (PORCINE) 1000 UNIT/ML IJ SOLN
INTRAMUSCULAR | Status: DC | PRN
  Administered 2019-08-11: 7000 [IU] via INTRAVENOUS

## 2019-08-11 MED ORDER — ALUM & MAG HYDROXIDE-SIMETH 200-200-20 MG/5ML PO SUSP: 15.0000 mL | ORAL | Status: DC | PRN

## 2019-08-11 MED ORDER — ONDANSETRON HCL 4 MG/2ML IJ SOLN
INTRAMUSCULAR | Status: AC
  Filled 2019-08-11: qty 2

## 2019-08-11 MED ORDER — HYDRALAZINE HCL 20 MG/ML IJ SOLN: 5.0000 mg | INTRAMUSCULAR | Status: DC | PRN

## 2019-08-11 MED ORDER — LACTATED RINGERS IV SOLN: INTRAVENOUS | Status: DC | PRN

## 2019-08-11 MED ORDER — ACETAMINOPHEN 325 MG RE SUPP
325.0000 mg | RECTAL | Status: DC | PRN
  Filled 2019-08-11: qty 2

## 2019-08-11 MED ORDER — TRIAMTERENE-HCTZ 75-50 MG PO TABS
1.0000 | ORAL_TABLET | Freq: Every day | ORAL | Status: DC
  Filled 2019-08-11 (×3): qty 1

## 2019-08-11 MED ORDER — LEVOTHYROXINE SODIUM 75 MCG PO TABS
75.0000 ug | ORAL_TABLET | Freq: Every day | ORAL | Status: DC
  Administered 2019-08-11: 75 ug via ORAL
  Filled 2019-08-11 (×3): qty 1

## 2019-08-11 MED ORDER — PHENYLEPHRINE HCL (PRESSORS) 10 MG/ML IV SOLN
INTRAVENOUS | Status: DC | PRN
  Administered 2019-08-11: 25 ug via INTRAVENOUS

## 2019-08-11 MED ORDER — HEMOSTATIC AGENTS (NO CHARGE) OPTIME
TOPICAL | Status: DC | PRN
  Administered 2019-08-11: 1 via TOPICAL

## 2019-08-11 MED ORDER — PROPOFOL 10 MG/ML IV BOLUS
INTRAVENOUS | Status: AC
  Filled 2019-08-11: qty 40

## 2019-08-11 MED ORDER — PHENYLEPHRINE HCL-NACL 20-0.9 MG/250ML-% IV SOLN
INTRAVENOUS | Status: DC | PRN
  Administered 2019-08-11: 25 ug/min via INTRAVENOUS

## 2019-08-11 MED ORDER — HEPARIN SODIUM (PORCINE) 1000 UNIT/ML IJ SOLN
INTRAMUSCULAR | Status: AC
  Filled 2019-08-11: qty 1

## 2019-08-11 MED ORDER — DEXAMETHASONE SODIUM PHOSPHATE 10 MG/ML IJ SOLN
INTRAMUSCULAR | Status: DC | PRN
  Administered 2019-08-11: 4 mg via INTRAVENOUS

## 2019-08-11 MED ORDER — PROPOFOL 500 MG/50ML IV EMUL
INTRAVENOUS | Status: AC
  Filled 2019-08-11: qty 50

## 2019-08-11 MED ORDER — ONDANSETRON HCL 4 MG/2ML IJ SOLN: 4.0000 mg | Freq: Four times a day (QID) | INTRAMUSCULAR | Status: DC | PRN

## 2019-08-11 MED ORDER — REMIFENTANIL HCL 1 MG IV SOLR
INTRAVENOUS | Status: DC | PRN
  Administered 2019-08-11: 50 ug via INTRAVENOUS
  Administered 2019-08-11: 100 ug via INTRAVENOUS

## 2019-08-11 MED ORDER — OXYCODONE HCL 5 MG PO TABS
ORAL_TABLET | ORAL | Status: AC
  Administered 2019-08-11: 5 mg via ORAL
  Filled 2019-08-11: qty 1

## 2019-08-11 MED ORDER — INSULIN ASPART 100 UNIT/ML ~~LOC~~ SOLN
0.0000 [IU] | Freq: Three times a day (TID) | SUBCUTANEOUS | Status: DC
  Administered 2019-08-12: 2 [IU] via SUBCUTANEOUS
  Filled 2019-08-11: qty 1

## 2019-08-11 MED ORDER — SODIUM CHLORIDE 0.9 % IV SOLN: 500.0000 mL | Freq: Once | INTRAVENOUS | Status: DC | PRN

## 2019-08-11 MED ORDER — POTASSIUM CHLORIDE IN NACL 20-0.9 MEQ/L-% IV SOLN
INTRAVENOUS | Status: AC
  Filled 2019-08-11: qty 1000

## 2019-08-11 MED ORDER — FENTANYL CITRATE (PF) 100 MCG/2ML IJ SOLN
25.0000 ug | INTRAMUSCULAR | Status: DC | PRN
  Administered 2019-08-11 (×2): 25 ug via INTRAVENOUS

## 2019-08-11 MED ORDER — NITROGLYCERIN IN D5W 200-5 MCG/ML-% IV SOLN
INTRAVENOUS | Status: AC
  Filled 2019-08-11: qty 250

## 2019-08-11 MED ORDER — GUAIFENESIN-DM 100-10 MG/5ML PO SYRP: 15.0000 mL | ORAL_SOLUTION | ORAL | Status: DC | PRN

## 2019-08-11 MED ORDER — FIBRIN SEALANT 2 ML SINGLE DOSE KIT
PACK | CUTANEOUS | Status: DC | PRN
  Administered 2019-08-11: 2 mL via TOPICAL

## 2019-08-11 MED ORDER — PROPOFOL 500 MG/50ML IV EMUL
INTRAVENOUS | Status: DC | PRN
  Administered 2019-08-11: 125 ug/kg/min via INTRAVENOUS

## 2019-08-11 MED ORDER — OXYCODONE HCL 5 MG PO TABS
5.0000 mg | ORAL_TABLET | ORAL | Status: DC | PRN
  Administered 2019-08-11: 5 mg via ORAL
  Administered 2019-08-12: 10 mg via ORAL
  Administered 2019-08-12: 5 mg via ORAL
  Filled 2019-08-11 (×3): qty 1

## 2019-08-11 MED ORDER — ATENOLOL 100 MG PO TABS
200.0000 mg | ORAL_TABLET | Freq: Every day | ORAL | Status: DC
  Filled 2019-08-11: qty 2

## 2019-08-11 MED ORDER — MORPHINE SULFATE (PF) 4 MG/ML IV SOLN: 2.0000 mg | INTRAVENOUS | Status: DC | PRN

## 2019-08-11 MED ORDER — CHLORHEXIDINE GLUCONATE CLOTH 2 % EX PADS: 6.0000 | MEDICATED_PAD | Freq: Once | CUTANEOUS | Status: DC

## 2019-08-11 MED ORDER — SODIUM CHLORIDE 0.9 % IV SOLN: INTRAVENOUS | Status: DC

## 2019-08-11 MED ORDER — MIDAZOLAM HCL 2 MG/2ML IJ SOLN
INTRAMUSCULAR | Status: DC | PRN
  Administered 2019-08-11: 2 mg via INTRAVENOUS

## 2019-08-11 MED ORDER — LIDOCAINE HCL (PF) 1 % IJ SOLN
INTRAMUSCULAR | Status: AC
  Filled 2019-08-11: qty 30

## 2019-08-11 MED ORDER — EZETIMIBE 10 MG PO TABS
10.0000 mg | ORAL_TABLET | Freq: Every day | ORAL | Status: DC
  Administered 2019-08-11: 10 mg via ORAL
  Filled 2019-08-11 (×3): qty 1

## 2019-08-11 MED ORDER — ONDANSETRON HCL 4 MG/2ML IJ SOLN: 4.0000 mg | Freq: Once | INTRAMUSCULAR | Status: DC | PRN

## 2019-08-11 MED ORDER — ROCURONIUM BROMIDE 100 MG/10ML IV SOLN
INTRAVENOUS | Status: DC | PRN
  Administered 2019-08-11: 50 mg via INTRAVENOUS
  Administered 2019-08-11: 20 mg via INTRAVENOUS

## 2019-08-11 MED ORDER — GLYCOPYRROLATE 0.2 MG/ML IJ SOLN
INTRAMUSCULAR | Status: AC
  Filled 2019-08-11: qty 1

## 2019-08-11 MED ORDER — HEPARIN SODIUM (PORCINE) 5000 UNIT/ML IJ SOLN
INTRAMUSCULAR | Status: AC
  Filled 2019-08-11: qty 1

## 2019-08-11 MED ORDER — DOCUSATE SODIUM 100 MG PO CAPS
100.0000 mg | ORAL_CAPSULE | Freq: Every day | ORAL | Status: DC
  Administered 2019-08-12: 100 mg via ORAL
  Filled 2019-08-11: qty 1

## 2019-08-11 MED ORDER — GABAPENTIN 300 MG PO CAPS
ORAL_CAPSULE | ORAL | Status: AC
  Administered 2019-08-11: 300 mg via ORAL
  Filled 2019-08-11: qty 1

## 2019-08-11 MED ORDER — MIDAZOLAM HCL 2 MG/2ML IJ SOLN
INTRAMUSCULAR | Status: AC
  Filled 2019-08-11: qty 2

## 2019-08-11 MED ORDER — FENTANYL CITRATE (PF) 100 MCG/2ML IJ SOLN
INTRAMUSCULAR | Status: DC | PRN
  Administered 2019-08-11: 75 ug via INTRAVENOUS
  Administered 2019-08-11: 25 ug via INTRAVENOUS

## 2019-08-11 MED ORDER — DOPAMINE-DEXTROSE 3.2-5 MG/ML-% IV SOLN
0.0000 ug/kg/min | INTRAVENOUS | Status: DC
  Administered 2019-08-11: 6 ug/kg/min via INTRAVENOUS

## 2019-08-11 MED ORDER — SEVOFLURANE IN SOLN
RESPIRATORY_TRACT | Status: AC
  Filled 2019-08-11: qty 250

## 2019-08-11 MED ORDER — EPHEDRINE SULFATE 50 MG/ML IJ SOLN
INTRAMUSCULAR | Status: DC | PRN
  Administered 2019-08-11 (×2): 10 mg via INTRAVENOUS

## 2019-08-11 MED ORDER — DOPAMINE-DEXTROSE 3.2-5 MG/ML-% IV SOLN
INTRAVENOUS | Status: AC
  Filled 2019-08-11: qty 250

## 2019-08-11 MED ORDER — METOPROLOL TARTRATE 5 MG/5ML IV SOLN: 2.0000 mg | INTRAVENOUS | Status: DC | PRN

## 2019-08-11 MED ORDER — LABETALOL HCL 5 MG/ML IV SOLN: 10.0000 mg | INTRAVENOUS | Status: DC | PRN

## 2019-08-11 MED ORDER — OXYCODONE HCL 5 MG/5ML PO SOLN: 5.0000 mg | Freq: Once | ORAL | Status: AC | PRN

## 2019-08-11 MED ORDER — SODIUM CHLORIDE 0.9 % IV SOLN
500.0000 mL | Freq: Once | INTRAVENOUS | Status: AC | PRN
  Administered 2019-08-11: 500 mL via INTRAVENOUS

## 2019-08-11 MED ORDER — ROCURONIUM BROMIDE 10 MG/ML (PF) SYRINGE
PREFILLED_SYRINGE | INTRAVENOUS | Status: AC
  Filled 2019-08-11: qty 10

## 2019-08-11 MED ORDER — FENTANYL CITRATE (PF) 100 MCG/2ML IJ SOLN
INTRAMUSCULAR | Status: AC
  Administered 2019-08-11: 25 ug via INTRAVENOUS
  Filled 2019-08-11: qty 2

## 2019-08-11 MED ORDER — ONDANSETRON HCL 4 MG/2ML IJ SOLN
INTRAMUSCULAR | Status: DC | PRN
  Administered 2019-08-11: 4 mg via INTRAVENOUS

## 2019-08-11 MED ORDER — ACETAMINOPHEN 10 MG/ML IV SOLN: 1000.0000 mg | Freq: Once | INTRAVENOUS | Status: DC | PRN

## 2019-08-11 MED ORDER — GABAPENTIN 300 MG PO CAPS
300.0000 mg | ORAL_CAPSULE | Freq: Three times a day (TID) | ORAL | Status: DC
  Administered 2019-08-11 – 2019-08-12 (×2): 300 mg via ORAL
  Filled 2019-08-11 (×2): qty 1

## 2019-08-11 MED ORDER — ALBUTEROL SULFATE HFA 108 (90 BASE) MCG/ACT IN AERS: 2.0000 | INHALATION_SPRAY | Freq: Four times a day (QID) | RESPIRATORY_TRACT | Status: DC | PRN

## 2019-08-11 MED ORDER — PANTOPRAZOLE SODIUM 40 MG PO TBEC
40.0000 mg | DELAYED_RELEASE_TABLET | Freq: Every day | ORAL | Status: DC
  Administered 2019-08-12: 40 mg via ORAL
  Filled 2019-08-11 (×2): qty 1

## 2019-08-11 MED ORDER — ALPRAZOLAM 0.5 MG PO TABS
0.2500 mg | ORAL_TABLET | Freq: Every evening | ORAL | Status: DC | PRN
  Administered 2019-08-11: 0.25 mg via ORAL
  Filled 2019-08-11: qty 1

## 2019-08-11 MED ORDER — LIDOCAINE HCL 1 % IJ SOLN
INTRAMUSCULAR | Status: DC | PRN
  Administered 2019-08-11: 6 mL

## 2019-08-11 MED ORDER — HYDROMORPHONE HCL 1 MG/ML IJ SOLN: 1.0000 mg | Freq: Once | INTRAMUSCULAR | Status: DC | PRN

## 2019-08-11 MED ORDER — CHLORHEXIDINE GLUCONATE CLOTH 2 % EX PADS: 6.0000 | MEDICATED_PAD | Freq: Every day | CUTANEOUS | Status: DC

## 2019-08-11 MED ORDER — ASPIRIN EC 81 MG PO TBEC
81.0000 mg | DELAYED_RELEASE_TABLET | Freq: Every day | ORAL | Status: DC
  Administered 2019-08-12: 81 mg via ORAL
  Filled 2019-08-11: qty 1

## 2019-08-11 MED ORDER — ACETAMINOPHEN 325 MG PO TABS: 325.0000 mg | ORAL_TABLET | ORAL | Status: DC | PRN

## 2019-08-11 MED ORDER — INSULIN ASPART 100 UNIT/ML ~~LOC~~ SOLN
0.0000 [IU] | Freq: Every day | SUBCUTANEOUS | Status: DC
  Administered 2019-08-11: 3 [IU] via SUBCUTANEOUS
  Filled 2019-08-11: qty 1

## 2019-08-11 MED ORDER — PHENOL 1.4 % MT LIQD
1.0000 | OROMUCOSAL | Status: DC | PRN
  Filled 2019-08-11: qty 177

## 2019-08-11 MED ORDER — PROPOFOL 10 MG/ML IV BOLUS
INTRAVENOUS | Status: DC | PRN
  Administered 2019-08-11: 100 mg via INTRAVENOUS

## 2019-08-11 MED ORDER — OXYCODONE HCL 5 MG PO TABS: 5.0000 mg | ORAL_TABLET | Freq: Once | ORAL | Status: AC | PRN

## 2019-08-11 SURGICAL SUPPLY — 72 items
APPLIER CLIP 11 MED OPEN (CLIP)
APPLIER CLIP 9.375 SM OPEN (CLIP)
BAG DECANTER FOR FLEXI CONT (MISCELLANEOUS) ×2 IMPLANT
BLADE SURG 15 STRL LF DISP TIS (BLADE) ×1 IMPLANT
BLADE SURG 15 STRL SS (BLADE) ×1
BLADE SURG SZ11 CARB STEEL (BLADE) ×2 IMPLANT
BOOT SUTURE AID YELLOW STND (SUTURE) ×3 IMPLANT
BRUSH SCRUB EZ  4% CHG (MISCELLANEOUS) ×1
BRUSH SCRUB EZ 4% CHG (MISCELLANEOUS) ×1 IMPLANT
CANISTER SUCT 1200ML W/VALVE (MISCELLANEOUS) ×2 IMPLANT
CHLORAPREP W/TINT 26 (MISCELLANEOUS) ×2 IMPLANT
CLIP APPLIE 11 MED OPEN (CLIP) IMPLANT
CLIP APPLIE 9.375 SM OPEN (CLIP) IMPLANT
COVER WAND RF STERILE (DRAPES) ×1 IMPLANT
DERMABOND ADVANCED (GAUZE/BANDAGES/DRESSINGS) ×1
DERMABOND ADVANCED .7 DNX12 (GAUZE/BANDAGES/DRESSINGS) ×1 IMPLANT
DRAPE 3/4 80X56 (DRAPES) ×2 IMPLANT
DRAPE INCISE IOBAN 66X45 STRL (DRAPES) ×2 IMPLANT
DRAPE LAPAROTOMY 100X77 ABD (DRAPES) ×2 IMPLANT
DRESSING SURGICEL FIBRLLR 1X2 (HEMOSTASIS) ×1 IMPLANT
DRSG SURGICEL FIBRILLAR 1X2 (HEMOSTASIS) ×2
ELECT CAUTERY BLADE 6.4 (BLADE) ×2 IMPLANT
ELECT REM PT RETURN 9FT ADLT (ELECTROSURGICAL) ×2
ELECTRODE REM PT RTRN 9FT ADLT (ELECTROSURGICAL) ×1 IMPLANT
GLOVE BIO SURGEON STRL SZ7 (GLOVE) ×1 IMPLANT
GLOVE BIOGEL PI IND STRL 7.5 (GLOVE) IMPLANT
GLOVE BIOGEL PI INDICATOR 7.5 (GLOVE) ×4
GLOVE INDICATOR 7.5 STRL GRN (GLOVE) ×1 IMPLANT
GLOVE PROTEXIS LATEX SZ 7.5 (GLOVE) ×8 IMPLANT
GLOVE SURG LATEX 7.5 PF (GLOVE) IMPLANT
GLOVE SURG SYN 8.0 (GLOVE) ×2 IMPLANT
GLOVE SURG SYN 8.0 PF PI (GLOVE) ×1 IMPLANT
GOWN STRL REUS W/ TWL LRG LVL3 (GOWN DISPOSABLE) ×2 IMPLANT
GOWN STRL REUS W/ TWL XL LVL3 (GOWN DISPOSABLE) ×1 IMPLANT
GOWN STRL REUS W/TWL LRG LVL3 (GOWN DISPOSABLE) ×3
GOWN STRL REUS W/TWL XL LVL3 (GOWN DISPOSABLE) ×1
HOLDER FOLEY CATH W/STRAP (MISCELLANEOUS) ×2 IMPLANT
IV NS 500ML (IV SOLUTION) ×1
IV NS 500ML BAXH (IV SOLUTION) ×1 IMPLANT
KIT TURNOVER KIT A (KITS) ×2 IMPLANT
LABEL OR SOLS (LABEL) ×2 IMPLANT
LOOP RED MAXI  1X406MM (MISCELLANEOUS) ×1
LOOP VESSEL MAXI 1X406 RED (MISCELLANEOUS) ×2 IMPLANT
LOOP VESSEL MINI 0.8X406 BLUE (MISCELLANEOUS) ×1 IMPLANT
LOOPS BLUE MINI 0.8X406MM (MISCELLANEOUS) ×1
NDL FILTER BLUNT 18X1 1/2 (NEEDLE) ×1 IMPLANT
NDL HYPO 25X1 1.5 SAFETY (NEEDLE) ×1 IMPLANT
NEEDLE FILTER BLUNT 18X 1/2SAF (NEEDLE)
NEEDLE FILTER BLUNT 18X1 1/2 (NEEDLE) IMPLANT
NEEDLE HYPO 25X1 1.5 SAFETY (NEEDLE) ×2 IMPLANT
NS IRRIG 500ML POUR BTL (IV SOLUTION) ×2 IMPLANT
PACK BASIN MAJOR ARMC (MISCELLANEOUS) ×2 IMPLANT
PATCH CAROTID ECM VASC 1X10 (Prosthesis & Implant Heart) ×1 IMPLANT
PENCIL ELECTRO HAND CTR (MISCELLANEOUS) IMPLANT
SHUNT CAROTID STR REINF 3.0X4. (MISCELLANEOUS) ×2 IMPLANT
SUT MNCRL+ 5-0 UNDYED PC-3 (SUTURE) ×1 IMPLANT
SUT MONOCRYL 5-0 (SUTURE) ×1
SUT PROLENE 6 0 BV (SUTURE) ×12 IMPLANT
SUT PROLENE 7 0 BV 1 (SUTURE) ×7 IMPLANT
SUT SILK 2 0 (SUTURE) ×1
SUT SILK 2-0 18XBRD TIE 12 (SUTURE) ×1 IMPLANT
SUT SILK 3 0 (SUTURE) ×1
SUT SILK 3-0 18XBRD TIE 12 (SUTURE) ×1 IMPLANT
SUT SILK 4 0 (SUTURE) ×1
SUT SILK 4-0 18XBRD TIE 12 (SUTURE) ×1 IMPLANT
SUT VIC AB 3-0 SH 27 (SUTURE) ×2
SUT VIC AB 3-0 SH 27X BRD (SUTURE) ×1 IMPLANT
SYR 10ML LL (SYRINGE) ×2 IMPLANT
SYR 20ML LL LF (SYRINGE) ×2 IMPLANT
SYR 3ML LL SCALE MARK (SYRINGE) ×2 IMPLANT
TRAY FOLEY MTR SLVR 16FR STAT (SET/KITS/TRAYS/PACK) ×2 IMPLANT
TUBING CONNECTING 10 (TUBING) IMPLANT

## 2019-08-11 NOTE — Care Plan (Signed)
I went to the front desk and the family was not waiting in the atrium per the volunteer.  I was given the number for [facility] which I called and left a message

## 2019-08-11 NOTE — Anesthesia Procedure Notes (Signed)
Procedure Name: Intubation Date/Time: 08/11/2019 7:43 AM Performed by: Arita Miss, MD Pre-anesthesia Checklist: Patient identified, Patient being monitored, Timeout performed, Emergency Drugs available and Suction available Patient Re-evaluated:Patient Re-evaluated prior to induction Oxygen Delivery Method: Circle system utilized Preoxygenation: Pre-oxygenation with 100% oxygen Induction Type: IV induction Ventilation: Mask ventilation without difficulty Laryngoscope Size: Mac and 4 (mac 4 too cumbersome for patient's mouth (for future reference)) Grade View: Grade II Tube type: Oral Tube size: 7.0 mm Number of attempts: 1 Airway Equipment and Method: Stylet and LTA kit utilized Placement Confirmation: ETT inserted through vocal cords under direct vision,  positive ETCO2 and breath sounds checked- equal and bilateral Secured at: 21 cm Tube secured with: Tape Dental Injury: Teeth and Oropharynx as per pre-operative assessment

## 2019-08-11 NOTE — Transfer of Care (Signed)
Immediate Anesthesia Transfer of Care Note  Patient: Andrea Rogers  Procedure(s) Performed: ENDARTERECTOMY CAROTID (Right )  Patient Location: PACU  Anesthesia Type:General  Level of Consciousness: awake, alert  and oriented  Airway & Oxygen Therapy: Patient Spontanous Breathing and Patient connected to face mask oxygen  Post-op Assessment: Report given to RN and Post -op Vital signs reviewed and stable  Post vital signs: Reviewed  Last Vitals:  Vitals Value Taken Time  BP 113/83 08/11/19 1003  Temp 36.6 C 08/11/19 1003  Pulse 69 08/11/19 1006  Resp 17 08/11/19 1006  SpO2 100 % 08/11/19 1006  Vitals shown include unvalidated device data.  Last Pain:  Vitals:   08/11/19 0639  TempSrc: Temporal  PainSc: 0-No pain      Patients Stated Pain Goal: 0 ([id number])  Complications: No apparent anesthesia complications

## 2019-08-11 NOTE — Anesthesia Preprocedure Evaluation (Signed)
Anesthesia Evaluation  Patient identified by MRN, date of birth, ID band Patient awake  General Assessment Comment:Never had general anesthetic, only colonoscopies  Reviewed: Allergy & Precautions, NPO status , Patient's Chart, lab work & pertinent test results  History of Anesthesia Complications Negative for: history of anesthetic complications  Airway Mallampati: II  TM Distance: >3 FB Neck ROM: Full    Dental no notable dental hx. (+) Teeth Intact, Dental Advisory Given   Pulmonary neg pulmonary ROS, neg sleep apnea, neg COPD, Current Smoker and Patient abstained from smoking.,    Pulmonary exam normal breath sounds clear to auscultation       Cardiovascular Exercise Tolerance: Good METShypertension, Pt. on medications (-) CAD and (-) Past MI (-) dysrhythmias  Rhythm:Regular Rate:Normal - Systolic murmurs    Neuro/Psych  Headaches, PSYCHIATRIC DISORDERS Anxiety Limb girdle muscular dystrophy (biopsy confirmed); does not affect respiratory muscles to her knowledge TIA Neuromuscular disease negative psych ROS   GI/Hepatic GERD  Controlled,(+)     (-) substance abuse  ,   Endo/Other  diabetes, Oral Hypoglycemic AgentsHypothyroidism   Renal/GU negative Renal ROS     Musculoskeletal   Abdominal   Peds  Hematology   Anesthesia Other Findings Past Medical History: No date: Anal condyloma No date: Arthritis No date: Bronchitis No date: Diabetes mellitus without complication (HCC) No date: GERD (gastroesophageal reflux disease) No date: Headache No date: HLD (hyperlipidemia) No date: Hypertension No date: Hypothyroidism No date: MD (muscular dystrophy) ([place]) No date: Menopause No date: Muscular dystrophy, limb girdle (HCC) No date: Osteopenia No date: Recurrent UTI 02/2019: Stroke [facility])     Comment:  TIA No date: Thyroid disease No date: Tobacco abuse  Reproductive/Obstetrics                              Anesthesia Physical Anesthesia Plan  ASA: III  Anesthesia Plan: General   Post-op Pain Management:    Induction: Intravenous  PONV Risk Score and Plan: 2 and Ondansetron, Dexamethasone, Propofol infusion, TIVA and Midazolam  Airway Management Planned: Oral ETT  Additional Equipment: Arterial line  Intra-op Plan:   Post-operative Plan: Extubation in OR  Informed Consent: I have reviewed the patients History and Physical, chart, labs and discussed the procedure including the risks, benefits and alternatives for the proposed anesthesia with the patient or authorized representative who has indicated his/her understanding and acceptance.     Dental advisory given  Plan Discussed with: CRNA and Surgeon  Anesthesia Plan Comments: (Discussed risks of anesthesia with patient, including PONV, sore throat, lip/dental damage. Rare risks discussed as well, such as cardiorespiratory and neurological sequelae. Discussed with patient how we will address her hx of muscular dystrophy (TIVA, no volatile, no succinylcholine). Patient understands.)        Anesthesia Quick Evaluation

## 2019-08-11 NOTE — Op Note (Signed)
[facility]   OPERATIVE NOTE  PROCEDURE:   1.  Right carotid endarterectomy with CorMatrix arterial patch reconstruction  PRE-OPERATIVE DIAGNOSIS: 1.  Critical carotid stenosis 2.  Status post TIA  POST-OPERATIVE DIAGNOSIS: same as above   SURGEON: Katha Cabal, MD  ASSISTANT(S): Ms. Hezzie Bump  ANESTHESIA: general  ESTIMATED BLOOD LOSS: 200 cc  FINDING(S): 1.  Extensive calcified carotid plaque.  SPECIMEN(S):  Carotid plaque (sent to Pathology)  INDICATIONS:   Andrea Rogers is a 59 y.o. y.o. female who presents with critical right carotid stenosis of 80%.  The risks, benefits, and alternatives to carotid endarterectomy were discussed with the patient. The differences between carotid stenting and carotid endarterectomy were reviewed.  The patient voiced understanding and appears to be aware that the risks of carotid endarterectomy include but are not limited to: bleeding, infection, stroke, myocardial infarction, death, cranial nerve injuries both temporary and permanent, neck hematoma, possible airway compromise, labile blood pressure post-operatively, cerebral hyperperfusion syndrome, and possible need for additional interventions in the future. The patient is aware of the risks and agrees to proceed forward with the procedure.  DESCRIPTION: After full informed written consent was obtained from the patient, the patient was brought back to the operating room and placed supine upon the operating table.  Prior to induction, the patient received IV antibiotics.  After obtaining adequate anesthesia, the patient was placed a supine position with a shoulder roll in place and the patient's neck slightly hyperextended and rotated away from the surgical site.  The patient was prepped in the standard fashion for a carotid endarterectomy.    A first assistant was required to provide a safe and appropriate environment for executing the surgery.  The assistant was  integral in providing retraction, exposure, running suture providing suction and in the closing process.  The incision was made anterior to the sternocleidomastoid muscle and dissected down through the subcutaneous tissue.  The platysmas was opened with electrocautery.  The internal jugular vein and facial vein were identified.  The facial vein is ligated and divided between 2-0 silk ties.  The omohyoid was identified in the common carotid artery exposed at this level. The dissection was there in carried out along the carotid artery in a cranial direction.  The dissection was then carried along periadventitial plane along the common carotid artery up to the bifurcation. The external carotid artery was identified. Vessel loops were then placed around the external carotid artery as well as the superior thyroid artery. In the process of this dissection, the hypoglossal nerve was identified and protected from harm.  The internal carotid artery was then dissected circumferentially just beyond an area in the internal carotid artery distal to the plaque.    At this point, we gave the patient 7000 units of intravenous heparin.  After this was allowed to circulate for several minutes, the common carotid followed by the external carotid and then the internal carotid artery were clamped.  Arteriotomy was made in the common carotid artery with a 11 blade, and extended the arteriotomy with a Potts scissor down into the common carotid artery, then the arteriotomy was carried through the bifurcation into the internal carotid artery until I reached an area that was not diseased.  At this point, a Sundt shunt was placed.  The endarterectomy was begun in the common carotid artery with a Garment/textile technologist and carried this dissection down into the common carotid artery circumferentially.  Then I transected the plaque at a  segment where it was adherent and transected the plaque with Potts scissors.  I then carried this  dissection up into the external carotid artery.  The plaque was extracted by unclamping the external carotid artery and performing an eversion endarterectomy.  The dissection was then carried into the internal carotid artery where a  feathered end point was created.  The plaque was passed off the field as a specimen.  The distal endpoint was tacked down with 6 interrupted 7-0 Prolene sutures.  A CorMatrix arterial patch was delivered onto the field and trimmed appropriately for the artery and sewed in place with 6-0 Prolene using a 4 quadrant technique.  The medial suture line was completed and the lateral suture line was run approximately one quarter the length of the arteriotomy.  Prior to completing this patch angioplasty, the shunt was removed, the internal carotid artery was flushed and there was excellent backbleeding.  The carotid artery repair was flushed with heparinized saline and then the patch angioplasty was completed in the usual fashion.  The flow was then reestablished first to the external carotid artery and then the internal carotid artery to prevent distal embolization.   Several minutes of pressure were held and 6-0 Prolene patch sutures were used as need for hemostasis.  At this point, I placed Surgicel and Evicel topical hemostatic agents.  There was no more active bleeding in the surgical site.  The sternocleidomastoid space was closed with three interrupted 3-0 Vicryl sutures. I then reapproximated the platysma muscle with a running stitch of 3-0 Vicryl.  The skin was then closed with a running subcuticular 4-0 Monocryl.  The skin was then cleaned, dried and Dermabond was used to reinforce the skin closure.  The patient awakened and was taken to the recovery room in stable condition, following commands and moving all four extremities without any apparent deficits.    COMPLICATIONS: none  CONDITION: stable  Andrea Rogers 08/11/2019<9:39 AM

## 2019-08-11 NOTE — H&P (Signed)
@LOGO @   MRN : [id number]  Andrea Rogers is a 59 y.o. (16-Mar-1961) female who presents with chief complaint of carotid stenosis.  History of Present Illness:  The patient is here for follow up of critical carotid stenosis.   The patient denies interval amaurosis fugax. There is no recent or interval TIA symptoms or focal motor deficits. There is no prior documented CVA.  The patient is taking enteric-coated aspirin 81 mg daily.  There is no history of migraine headaches. There is no history of seizures.  The patient has a history of coronary artery disease, no recent episodes of angina or shortness of breath. The patient denies PAD or claudication symptoms. There is a history of hyperlipidemia which is being treated with a statin.   CT angiogram is reviewed by me personally and shows >70% stenosis consistent with calcified plaque at the origin of the right internal carotid artery.   Current Meds  Medication Sig  . albuterol (VENTOLIN HFA) 108 (90 Base) MCG/ACT inhaler Inhale 2 puffs into the lungs every 6 (six) hours as needed for wheezing or shortness of breath.   . ALPRAZolam (XANAX) 0.25 MG tablet Take 0.25 mg by mouth at bedtime as needed for anxiety or sleep.   Marland Kitchen aspirin EC 81 MG tablet Take 81 mg by mouth daily.  Marland Kitchen atenolol (TENORMIN) 100 MG tablet Take 200 mg by mouth daily.   Marland Kitchen ezetimibe (ZETIA) 10 MG tablet Take 10 mg by mouth at bedtime.   . gabapentin (NEURONTIN) 300 MG capsule Take 300-600 mg by mouth 3 (three) times daily.   Marland Kitchen glimepiride (AMARYL) 1 MG tablet Take 1 mg by mouth daily.   Marland Kitchen HYDROcodone-acetaminophen (NORCO) 7.5-325 MG tablet Take 1 tablet by mouth every 6 (six) hours as needed for moderate pain. Takes mostly at night  . levothyroxine (SYNTHROID, LEVOTHROID) 75 MCG tablet Take 75 mcg by mouth at bedtime.   . metFORMIN (GLUCOPHAGE) 500 MG tablet Take 1,000 mg by mouth 2 (two) times daily with a meal.   . omeprazole (PRILOSEC) 40 MG capsule Take 40 mg by  mouth daily.  Marland Kitchen triamterene-hydrochlorothiazide (MAXZIDE) 75-50 MG tablet Take 1 tablet by mouth daily.     Past Medical History:  Diagnosis Date  . Anal condyloma   . Arthritis   . Bronchitis   . Diabetes mellitus without complication ([place])   . GERD (gastroesophageal reflux disease)   . Headache   . HLD (hyperlipidemia)   . Hypertension   . Hypothyroidism   . MD (muscular dystrophy) ([place])   . Menopause   . 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 Osteopenia   . Recurrent UTI   . Stroke [facility]) 02/2019   TIA  . Thyroid disease   . Tobacco abuse     Past Surgical History:  Procedure Laterality Date  . COLONOSCOPY WITH PROPOFOL N/A 04/25/2018   Procedure: COLONOSCOPY WITH PROPOFOL;  Surgeon: Lollie Sails, MD;  Location: [facility] ENDOSCOPY;  Service: Endoscopy;  Laterality: N/A;  . NO PAST SURGERIES      Social History Social History   Tobacco Use  . Smoking status: Current Every Day Smoker    Packs/day: 1.00    Types: Cigarettes  . Smokeless tobacco: Never Used  Substance Use Topics  . Alcohol use: No  . Drug use: No    Family History Family History  Adopted: Yes  Problem Relation Age of Onset  . Breast cancer Mother   . Colon cancer Father   .  Diabetes Father     Allergies  Allergen Reactions  . Latex Rash  . Penicillins Other (See Comments)    Yeast infection Did it involve swelling of the face/tongue/throat, SOB, or low BP? No Did it involve sudden or severe rash/hives, skin peeling, or any reaction on the inside of your mouth or nose? No Did you need to seek medical attention at a hospital or doctor's office? No When did it last happen? If all above answers are "NO", may proceed with cephalosporin use.      REVIEW OF SYSTEMS (Negative unless checked)  Constitutional: [] Weight loss  [] Fever  [] Chills Cardiac: [] Chest pain   [] Chest pressure   [] Palpitations   [] Shortness of breath when laying flat   [] Shortness of breath with  exertion. Vascular:  [] Pain in legs with walking   [] Pain in legs at rest  [] History of DVT   [] Phlebitis   [] Swelling in legs   [] Varicose veins   [] Non-healing ulcers Pulmonary:   [] Uses home oxygen   [] Productive cough   [] Hemoptysis   [] Wheeze  [] COPD   [] Asthma Neurologic:  [] Dizziness   [] Seizures   [] History of stroke   [] History of TIA  [] Aphasia   [] Vissual changes   [] Weakness or numbness in arm   [x] Weakness or numbness in leg Musculoskeletal:   [] Joint swelling   [x] Joint pain   [] Low back pain Hematologic:  [] Easy bruising  [] Easy bleeding   [] Hypercoagulable state   [] Anemic Gastrointestinal:  [] Diarrhea   [] Vomiting  [] Gastroesophageal reflux/heartburn   [] Difficulty swallowing. Genitourinary:  [] Chronic kidney disease   [] Difficult urination  [] Frequent urination   [] Blood in urine Skin:  [] Rashes   [] Ulcers  Psychological:  [] History of anxiety   []  History of major depression.  Physical Examination  Vitals:   08/11/19 0639  BP: 130/67  Pulse: 62  Resp: 20  Temp: 97.6 F (36.4 C)  TempSrc: Temporal  SpO2: 97%  Weight: 61.2 kg  Height: 5\' 4"  (1.626 m)   Body mass index is 23.17 kg/m. Gen: WD/WN, NAD seen in bed (normally in wheelchair) Head: [place]/AT, No temporalis wasting.  Ear/Nose/Throat: Hearing grossly intact, nares w/o erythema or drainage Eyes: PER, EOMI, sclera nonicteric.  Neck: Supple, no large masses.   Pulmonary:  Good air movement, no audible wheezing bilaterally, no use of accessory muscles.  Cardiac: RRR, no JVD Vascular: right carotid bruit Vessel Right Left  Radial Palpable Palpable  Brachial Palpable Palpable  Carotid Palpable Palpable  Gastrointestinal: Non-distended. No guarding/no peritoneal signs.  Musculoskeletal: M/S 5/5 throughout.  No deformity or atrophy.  Neurologic: CN 2-12 intact. Symmetrical.  Speech is fluent. Motor exam as listed above. Psychiatric: Judgment intact, Mood & affect appropriate for pt's clinical  situation. Dermatologic: No rashes or ulcers noted.  No changes consistent with cellulitis. Lymph : No lichenification or skin changes of chronic lymphedema.  CBC Lab Results  Component Value Date   WBC 10.6 (H) 02/17/2019   HGB 15.5 (H) 02/17/2019   HCT 47.4 (H) 02/17/2019   MCV 95.2 02/17/2019   PLT 309 02/17/2019    BMET    Component Value Date/Time   NA 134 (L) 02/17/2019 1718   K 4.2 02/17/2019 1718   CL 91 (L) 02/17/2019 1718   CO2 25 02/17/2019 1718   GLUCOSE 101 (H) 02/17/2019 1718   BUN 17 02/17/2019 1718   CREATININE 0.40 (L) 04/13/2019 1132   CALCIUM 9.9 02/17/2019 1718   GFRNONAA >60 02/17/2019 1718   GFRAA >60 02/17/2019 1718  CrCl cannot be calculated (Patient's most recent lab result is older than the maximum 21 days allowed.).  COAG Lab Results  Component Value Date   INR 0.9 08/09/2019    Radiology No results found.    Assessment/Plan 1. TIA (transient ischemic attack) Recommend:  The patient has a history of TIA related to the carotid stenosis.  The patient has now progressed and has a lesion the is >75%.  Patient's CT angiography of the carotid arteries confirms >75% right ICA stenosis.  The anatomical considerations support surgery over stenting but her co-morbidities would support stenting.  This was discussed in detail with the patient.  The patient does indeed need surgery, therefore, cardiac clearance will be arranged. Once cleared the patient will be scheduled for surgery.  The risks, benefits and alternative therapies were reviewed in detail with the patient.  All questions were answered.  The patient agrees to proceed with surgery of the right carotid artery.  Continue antiplatelet therapy as prescribed. Continue management of CAD, HTN and Hyperlipidemia. Healthy heart diet, encouraged exercise at least 4 times per week.    2. Bilateral carotid artery stenosis See #1  3. Essential hypertension Continue antihypertensive  medications as already ordered, these medications have been reviewed and there are no changes at this time.   4. Controlled type 2 diabetes mellitus with other circulatory complication, without long-term current use of insulin (HCC) Continue hypoglycemic medications as already ordered, these medications have been reviewed and there are no changes at this time.  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

## 2019-08-11 NOTE — Anesthesia Postprocedure Evaluation (Signed)
Anesthesia Post Note  Patient: Andrea Rogers  Procedure(s) Performed: ENDARTERECTOMY CAROTID (Right )  Patient location during evaluation: PACU Anesthesia Type: General Level of consciousness: awake and alert Pain management: pain level controlled Vital Signs Assessment: post-procedure vital signs reviewed and stable Respiratory status: spontaneous breathing, nonlabored ventilation, respiratory function stable and patient connected to nasal cannula oxygen Cardiovascular status: blood pressure returned to baseline and stable Postop Assessment: no apparent nausea or vomiting Anesthetic complications: no     Last Vitals:  Vitals:   08/11/19 1048 08/11/19 1103  BP: 101/67 (!) 99/56  Pulse: 62 69  Resp: 14 (!) 21  Temp:    SpO2: 96% 96%    Last Pain:  Vitals:   08/11/19 1033  TempSrc:   PainSc: 2                  Arita Miss

## 2019-08-11 NOTE — Anesthesia Procedure Notes (Signed)
Arterial Line Insertion Start/End3/19/2021 7:45 AM Performed by: Argie Ramming, RN, Constance Goltz, RN, anesthesiologist  Patient location: OR. Preanesthetic checklist: patient identified, IV checked, site marked, risks and benefits discussed, surgical consent, monitors and equipment checked, pre-op evaluation, timeout performed and anesthesia consent Left, radial was placed Catheter size: 20 Fr Hand hygiene performed  and maximum sterile barriers used   Attempts: 1 Procedure performed without using ultrasound guided technique. Following insertion, dressing applied. Post procedure assessment: normal and unchanged  Patient tolerated the procedure well with no immediate complications.

## 2019-08-12 ENCOUNTER — Encounter: Payer: Self-pay | Admitting: Vascular Surgery

## 2019-08-12 LAB — BASIC METABOLIC PANEL
Anion gap: 8 (ref 5–15)
BUN: 14 mg/dL (ref 6–20)
CO2: 28 mmol/L (ref 22–32)
Calcium: 8.5 mg/dL — ABNORMAL LOW (ref 8.9–10.3)
Chloride: 103 mmol/L (ref 98–111)
Creatinine, Ser: <0.3 mg/dL — ABNORMAL LOW (ref 0.44–1.00)
Glucose, Bld: 124 mg/dL — ABNORMAL HIGH (ref 70–99)
Potassium: 4 mmol/L (ref 3.5–5.1)
Sodium: 139 mmol/L (ref 135–145)

## 2019-08-12 LAB — MAGNESIUM: Magnesium: 1.9 mg/dL (ref 1.7–2.4)

## 2019-08-12 LAB — CBC
HCT: 33.9 % — ABNORMAL LOW (ref 36.0–46.0)
Hemoglobin: 10.4 g/dL — ABNORMAL LOW (ref 12.0–15.0)
MCH: 29.6 pg (ref 26.0–34.0)
MCHC: 30.7 g/dL (ref 30.0–36.0)
MCV: 96.6 fL (ref 80.0–100.0)
Platelets: 227 10*3/uL (ref 150–400)
RBC: 3.51 MIL/uL — ABNORMAL LOW (ref 3.87–5.11)
RDW: 14.2 % (ref 11.5–15.5)
WBC: 10.4 10*3/uL (ref 4.0–10.5)
nRBC: 0 % (ref 0.0–0.2)

## 2019-08-12 LAB — GLUCOSE, CAPILLARY
Glucose-Capillary: 124 mg/dL — ABNORMAL HIGH (ref 70–99)
Glucose-Capillary: 98 mg/dL (ref 70–99)

## 2019-08-12 MED ORDER — HYDROCODONE-ACETAMINOPHEN 5-325 MG PO TABS: 2.0000 | ORAL_TABLET | Freq: Four times a day (QID) | ORAL | 0 refills | Status: DC | PRN

## 2019-08-12 NOTE — Progress Notes (Signed)
Contacted on call provider Hazle Nordmann MD regarding patients blood glucose of 297. Verbal order to put insulin scale in. I also discussed the patients trend with her blood pressure. Md expressed that he was okay with a MAP 60 or above.

## 2019-08-12 NOTE — Progress Notes (Signed)
    Subjective  - POD #1, s/p R CEA  Mild HA and sore throat Ambulated this am Tolerated PO   Physical Exam:  Neuro intact Slight marginal mandibular neuropraxia Incision without hematoma       Assessment/Plan:  POD #1  Stable for discharge  Wells Natha Guin 08/12/2019 11:53 AM --  Vitals:   08/12/19 1000 08/12/19 1100  BP: (!) 116/53 (!) 117/58  Pulse: (!) 48 (!) 53  Resp: 16 13  Temp:    SpO2: 96% 98%    Intake/Output Summary (Last 24 hours) at 08/12/2019 1153 Last data filed at 08/12/2019 0400 Gross per 24 hour  Intake 184.97 ml  Output 1300 ml  Net -1115.03 ml     Laboratory CBC    Component Value Date/Time   WBC 10.4 08/12/2019 0426   HGB 10.4 (L) 08/12/2019 0426   HCT 33.9 (L) 08/12/2019 0426   PLT 227 08/12/2019 0426    BMET    Component Value Date/Time   NA 139 08/12/2019 0426   K 4.0 08/12/2019 0426   CL 103 08/12/2019 0426   CO2 28 08/12/2019 0426   GLUCOSE 124 (H) 08/12/2019 0426   BUN 14 08/12/2019 0426   CREATININE <0.30 (L) 08/12/2019 0426   CALCIUM 8.5 (L) 08/12/2019 0426   GFRNONAA NOT CALCULATED 08/12/2019 0426   GFRAA NOT CALCULATED 08/12/2019 0426    COAG Lab Results  Component Value Date   INR 0.9 08/09/2019   No results found for: PTT  Antibiotics Anti-infectives (From admission, onward)   Start     Dose/Rate Route Frequency Ordered Stop   08/11/19 0945  clindamycin (CLEOCIN) IVPB 300 mg     300 mg 100 mL/hr over 30 Minutes Intravenous Every 8 hours 08/11/19 0938 08/12/19 0759   08/11/19 0636  clindamycin (CLEOCIN) 300 MG/50ML IVPB    Note to Pharmacy: Register, Karen   : cabinet override      08/11/19 0636 08/11/19 0758   08/11/19 0600  clindamycin (CLEOCIN) IVPB 300 mg     300 mg 100 mL/hr over 30 Minutes Intravenous On call to O.R. 08/10/19 2152 08/11/19 0747       V. Leia Alf, M.D., [facility] [place] Office: [phone] Pager:  [phone]

## 2019-08-12 NOTE — Discharge Summary (Signed)
Physician Discharge Summary  Patient ID: Andrea Rogers MRN: [id number] DOB/AGE: 28-Oct-1960 59 y.o.  Admit date: 08/11/2019 Discharge date: 08/12/2019  Admission Diagnoses:  Discharge Diagnoses:  Active Problems:   Carotid stenosis, symptomatic w/o infarct, right   Discharged Condition: good  Hospital Course: admitted post right CEA.  Required Dopa for several hours.  Neuron intact  Consults: None   Discharge Exam: Blood pressure (!) 117/58, pulse (!) 53, temperature 98 F (36.7 C), temperature source Oral, resp. rate 13, height 5\' 4"  (1.626 m), weight 61.2 kg, SpO2 98 %. Neuro intact, incision c/d/i  Disposition:   Home   Allergies as of 08/12/2019      Reactions   Latex Rash   Penicillins Other (See Comments)   Yeast infection Did it involve swelling of the face/tongue/throat, SOB, or low BP? No Did it involve sudden or severe rash/hives, skin peeling, or any reaction on the inside of your mouth or nose? No Did you need to seek medical attention at a hospital or doctor's office? No When did it last happen? If all above answers are "NO", may proceed with cephalosporin use.      Medication List    TAKE these medications   albuterol 108 (90 Base) MCG/ACT inhaler Commonly known as: VENTOLIN HFA Inhale 2 puffs into the lungs every 6 (six) hours as needed for wheezing or shortness of breath.   ALPRAZolam 0.25 MG tablet Commonly known as: XANAX Take 0.25 mg by mouth at bedtime as needed for anxiety or sleep.   aspirin EC 81 MG tablet Take 81 mg by mouth daily.   atenolol 100 MG tablet Commonly known as: TENORMIN Take 200 mg by mouth daily.   gabapentin 300 MG capsule Commonly known as: NEURONTIN Take 300-600 mg by mouth 3 (three) times daily.   glimepiride 1 MG tablet Commonly known as: AMARYL Take 1 mg by mouth daily.   HYDROcodone-acetaminophen 7.5-325 MG tablet Commonly known as: NORCO Take 1 tablet by mouth every 6 (six) hours as needed for moderate  pain. Takes mostly at night What changed: Another medication with the same name was added. Make sure you understand how and when to take each.   HYDROcodone-acetaminophen 5-325 MG tablet Commonly known as: Norco Take 2 tablets by mouth every 6 (six) hours as needed for moderate pain. What changed: You were already taking a medication with the same name, and this prescription was added. Make sure you understand how and when to take each.   levothyroxine 75 MCG tablet Commonly known as: SYNTHROID Take 75 mcg by mouth at bedtime.   metFORMIN 500 MG tablet Commonly known as: GLUCOPHAGE Take 1,000 mg by mouth 2 (two) times daily with a meal.   omeprazole 40 MG capsule Commonly known as: PRILOSEC Take 40 mg by mouth daily.   triamterene-hydrochlorothiazide 75-50 MG tablet Commonly known as: MAXZIDE Take 1 tablet by mouth daily.   Zetia 10 MG tablet Generic drug: ezetimibe Take 10 mg by mouth at bedtime.        Signed: Wells Christifer Chapdelaine 08/12/2019, 11:59 AM

## 2019-08-12 NOTE — Progress Notes (Signed)
Pt has remained alert and oriented x 4 with c/o sore, achy pain to right neck incision -> PRN oxycodone 5mg  given x1.  Pt has remained SB in the 50s on cardiac monitor, BP soft. Atenolol 200mg  HELD d/t HR -> pt advised to continue to HOLD atenolol 200mg  and schedule OP appt with Parachoes, MD for further med recommendations per Brabham, MD.  Pt has remained on RA, lung sounds clear/diminished in lower lobes, SpO2 > 90%, NDN.  Pt has voided x 2 since foley removal. Pt with adequate appetite and mobilization. Husband has been updated and remained at bedside. Pt with orders to discharge home.

## 2019-08-14 LAB — SURGICAL PATHOLOGY

## 2019-08-15 ENCOUNTER — Encounter (INDEPENDENT_AMBULATORY_CARE_PROVIDER_SITE_OTHER): Payer: Self-pay

## 2019-08-15 DIAGNOSIS — G459 Transient cerebral ischemic attack, unspecified: Secondary | ICD-10-CM | POA: Diagnosis not present

## 2019-08-15 DIAGNOSIS — I498 Other specified cardiac arrhythmias: Secondary | ICD-10-CM | POA: Diagnosis not present

## 2019-08-15 DIAGNOSIS — I10 Essential (primary) hypertension: Secondary | ICD-10-CM | POA: Diagnosis not present

## 2019-08-15 DIAGNOSIS — E1121 Type 2 diabetes mellitus with diabetic nephropathy: Secondary | ICD-10-CM | POA: Diagnosis not present

## 2019-08-23 ENCOUNTER — Other Ambulatory Visit: Payer: Self-pay

## 2019-08-23 ENCOUNTER — Ambulatory Visit (INDEPENDENT_AMBULATORY_CARE_PROVIDER_SITE_OTHER): Payer: 59 | Admitting: Obstetrics and Gynecology

## 2019-08-23 ENCOUNTER — Encounter: Payer: Self-pay | Admitting: Obstetrics and Gynecology

## 2019-08-23 VITALS — BP 128/70 | HR 69 | Ht 64.0 in | Wt 135.3 lb

## 2019-08-23 DIAGNOSIS — G7109 Other specified muscular dystrophies: Secondary | ICD-10-CM | POA: Diagnosis not present

## 2019-08-23 DIAGNOSIS — Z860100 Personal history of colon polyps, unspecified: Secondary | ICD-10-CM

## 2019-08-23 DIAGNOSIS — Z780 Asymptomatic menopausal state: Secondary | ICD-10-CM

## 2019-08-23 DIAGNOSIS — G71039 Limb girdle muscular dystrophy, unspecified: Secondary | ICD-10-CM

## 2019-08-23 DIAGNOSIS — Z720 Tobacco use: Secondary | ICD-10-CM | POA: Diagnosis not present

## 2019-08-23 DIAGNOSIS — Z01419 Encounter for gynecological examination (general) (routine) without abnormal findings: Secondary | ICD-10-CM | POA: Diagnosis not present

## 2019-08-23 DIAGNOSIS — Z1231 Encounter for screening mammogram for malignant neoplasm of breast: Secondary | ICD-10-CM | POA: Diagnosis not present

## 2019-08-23 DIAGNOSIS — M8580 Other specified disorders of bone density and structure, unspecified site: Secondary | ICD-10-CM

## 2019-08-23 NOTE — Progress Notes (Signed)
ANNUAL PREVENTATIVE CARE GYNECOLOGY  ENCOUNTER NOTE  Subjective:       Andrea Rogers is a 59 y.o. married G0P0000 postmenopausal female here for a routine annual gynecologic exam.  She has a PMH significant for muscular dystrophy, hypothyroidism, hypertension, and diabetes. She is transitioning care from Dr. Enzo Bi who has retired. The patient has never taken hormone replacement therapy. Patient denies post-menopausal vaginal bleeding. The patient wears seatbelts: yes. The patient participates in regular exercise: no. Has the patient ever been transfused or tattooed?: no.    Current complaints: 1.  Patient reports that she recently underwent a right carotid endarterectomy approximately 1.5 weeks ago. States she is overall recovering well.    Gynecologic History No LMP recorded (lmp unknown). Patient is postmenopausal. Contraception: post menopausal status Last Pap: 04/14/2017. Results were: normal Last mammogram: 03/2017. Results were: normal Last Colonoscopy: 04/25/2018. Results were: diverticula and ~ 6 colon polyps noted. Last Dexa Scan: Cannot recall date. Review of chart notes history of osteopenia.     Obstetric History OB History  Gravida Para Term Preterm AB Living  0 0 0 0 0 0  SAB TAB Ectopic Multiple Live Births  0 0 0 0 0    Past Medical History:  Diagnosis Date  . Anal condyloma   . Arthritis   . Bronchitis   . Diabetes mellitus without complication ([place])   . GERD (gastroesophageal reflux disease)   . Headache   . HLD (hyperlipidemia)   . Hypertension   . Hypothyroidism   . MD (muscular dystrophy) ([place])   . Menopause   . 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 Osteopenia   . Recurrent UTI   . Stroke [facility]) 02/2019   TIA  . Thyroid disease   . Tobacco abuse     Family History  Adopted: Yes  Problem Relation Age of Onset  . Breast cancer Mother   . Colon cancer Father   . Diabetes Father     Past Surgical History:  Procedure Laterality Date   . COLONOSCOPY WITH PROPOFOL N/A 04/25/2018   Procedure: COLONOSCOPY WITH PROPOFOL;  Surgeon: Lollie Sails, MD;  Location: [facility] ENDOSCOPY;  Service: Endoscopy;  Laterality: N/A;  . ENDARTERECTOMY Right 08/11/2019   Procedure: ENDARTERECTOMY CAROTID;  Surgeon: Katha Cabal, MD;  Location: [facility] ORS;  Service: Vascular;  Laterality: Right;  . NO PAST SURGERIES      Social History   Socioeconomic History  . Marital status: Married    Spouse name: Not on file  . Number of children: Not on file  . Years of education: Not on file  . Highest education level: Not on file  Occupational History  . Not on file  Tobacco Use  . Smoking status: Current Every Day Smoker    Packs/day: 1.00    Types: Cigarettes  . Smokeless tobacco: Never Used  Substance and Sexual Activity  . Alcohol use: No  . Drug use: No  . Sexual activity: Not Currently    Birth control/protection: Post-menopausal  Other Topics Concern  . Not on file  Social History Narrative  . Not on file   Social Determinants of Health   Financial Resource Strain:   . Difficulty of Paying Living Expenses:   Food Insecurity:   . Worried About Charity fundraiser in the Last Year:   . Arboriculturist in the Last Year:   Transportation Needs:   . Film/video editor (Medical):   Marland Kitchen Lack of  Transportation (Non-Medical):   Physical Activity:   . Days of Exercise per Week:   . Minutes of Exercise per Session:   Stress:   . Feeling of Stress :   Social Connections:   . Frequency of Communication with Friends and Family:   . Frequency of Social Gatherings with Friends and Family:   . Attends Religious Services:   . Active Member of Clubs or Organizations:   . Attends Archivist Meetings:   Marland Kitchen Marital Status:   Intimate Partner Violence:   . Fear of Current or Ex-Partner:   . Emotionally Abused:   Marland Kitchen Physically Abused:   . Sexually Abused:     Current Outpatient Medications on File Prior to Visit   Medication Sig Dispense Refill  . albuterol (VENTOLIN HFA) 108 (90 Base) MCG/ACT inhaler Inhale 2 puffs into the lungs every 6 (six) hours as needed for wheezing or shortness of breath.     . ALPRAZolam (XANAX) 0.25 MG tablet Take 0.25 mg by mouth at bedtime as needed for anxiety or sleep.     Marland Kitchen aspirin EC 81 MG tablet Take 81 mg by mouth daily.    Marland Kitchen atenolol (TENORMIN) 100 MG tablet Take 200 mg by mouth daily.     Marland Kitchen ezetimibe (ZETIA) 10 MG tablet Take 10 mg by mouth at bedtime.     . gabapentin (NEURONTIN) 300 MG capsule Take 300-600 mg by mouth 3 (three) times daily.     Marland Kitchen glimepiride (AMARYL) 1 MG tablet Take 1 mg by mouth daily.     Marland Kitchen HYDROcodone-acetaminophen (NORCO) 7.5-325 MG tablet Take 1 tablet by mouth every 6 (six) hours as needed for moderate pain. Takes mostly at night    . levothyroxine (SYNTHROID, LEVOTHROID) 75 MCG tablet Take 75 mcg by mouth at bedtime.     . metFORMIN (GLUCOPHAGE) 500 MG tablet Take 1,000 mg by mouth 2 (two) times daily with a meal.     . omeprazole (PRILOSEC) 40 MG capsule Take 40 mg by mouth daily.    Marland Kitchen triamterene-hydrochlorothiazide (MAXZIDE) 75-50 MG tablet Take 1 tablet by mouth daily.      No current facility-administered medications on file prior to visit.    Allergies  Allergen Reactions  . Latex Rash  . Penicillins Other (See Comments)    Yeast infection Did it involve swelling of the face/tongue/throat, SOB, or low BP? No Did it involve sudden or severe rash/hives, skin peeling, or any reaction on the inside of your mouth or nose? No Did you need to seek medical attention at a hospital or doctor's office? No When did it last happen? If all above answers are "NO", may proceed with cephalosporin use.       Review of Systems ROS Review of Systems - General ROS: negative for - chills, fatigue, fever, hot flashes, night sweats, weight gain or weight loss Psychological ROS: negative for - anxiety, decreased libido, depression, mood  swings, physical abuse or sexual abuse Ophthalmic ROS: negative for - blurry vision, eye pain or loss of vision ENT ROS: negative for - headaches, hearing change, visual changes or vocal changes Allergy and Immunology ROS: negative for - hives, itchy/watery eyes or seasonal allergies Hematological and Lymphatic ROS: negative for - bleeding problems, bruising, swollen lymph nodes or weight loss Endocrine ROS: negative for - galactorrhea, hair pattern changes, hot flashes, malaise/lethargy, mood swings, palpitations, polydipsia/polyuria, skin changes, temperature intolerance or unexpected weight changes Breast ROS: negative for - new or changing breast lumps or  nipple discharge Respiratory ROS: negative for - cough or shortness of breath Cardiovascular ROS: negative for - chest pain, irregular heartbeat, palpitations or shortness of breath Gastrointestinal ROS: no abdominal pain, change in bowel habits, or black or bloody stools Genito-Urinary ROS: no dysuria, trouble voiding, or hematuria Musculoskeletal ROS: negative for - joint pain or joint stiffness Neurological ROS: negative for - bowel and bladder control changes Dermatological ROS: negative for rash and skin lesion changes   Objective:   BP 128/70   Pulse 69   Ht 5\' 4"  (1.626 m)   Wt 135 lb 4.8 oz (61.4 kg)   LMP  (LMP Unknown)   BMI 23.22 kg/m  CONSTITUTIONAL: Well-developed, well-nourished female in no acute distress.  PSYCHIATRIC: Normal mood and affect. Normal behavior. Normal judgment and thought content. [place]: Alert and oriented to person, place, and time. Normal muscle tone coordination. No cranial nerve deficit noted. HENT:  Normocephalic, atraumatic, External right and left ear normal. Oropharynx is clear and moist EYES: Conjunctivae and EOM are normal. Pupils are equal, round, and reactive to light. No scleral icterus.  NECK: Normal range of motion, supple, no masses.  Normal thyroid. Scar present on right side of  neck, healing well.  SKIN: Skin is warm and dry. No rash noted. Not diaphoretic. No erythema. No pallor. CARDIOVASCULAR: Normal heart rate noted, regular rhythm, no murmur. RESPIRATORY: Clear to auscultation bilaterally. Effort and breath sounds normal, no problems with respiration noted. BREASTS: Symmetric in size. No masses, skin changes, nipple drainage, or lymphadenopathy. ABDOMEN: Soft, normal bowel sounds, no distention noted.  No tenderness, rebound or guarding.  BLADDER: Normal PELVIC:  Bladder no bladder distension noted  Urethra: normal appearing urethra with no masses, tenderness or lesions  Vulva: normal appearing vulva with no masses, tenderness or lesions  Vagina: atrophic vagina (mild to moderate), no discharge.   Cervix: normal appearing cervix without discharge or lesions  Uterus: uterus is normal size, shape, consistency and nontender  Adnexa: normal adnexa in size, nontender and no masses  RV: External Exam NormaI and Normal Sphincter tone  MUSCULOSKELETAL: Normal range of motion. No tenderness.  No cyanosis, clubbing, or edema.  2+ distal pulses.  Hypertrophied calf-muscles.  LYMPHATIC: No Axillary, Supraclavicular, or Inguinal Adenopathy.   Labs: Lab Results  Component Value Date   WBC 10.4 08/12/2019   HGB 10.4 (L) 08/12/2019   HCT 33.9 (L) 08/12/2019   MCV 96.6 08/12/2019   PLT 227 08/12/2019    Lab Results  Component Value Date   CREATININE <0.30 (L) 08/12/2019   BUN 14 08/12/2019   NA 139 08/12/2019   K 4.0 08/12/2019   CL 103 08/12/2019   CO2 28 08/12/2019    Lab Results  Component Value Date   ALT 23 02/17/2019   AST 27 02/17/2019   ALKPHOS 73 02/17/2019   BILITOT 0.7 02/17/2019    No results found for: CHOL, HDL, LDLCALC, LDLDIRECT, TRIG, CHOLHDL  No results found for: TSH  Lab Results  Component Value Date   HGBA1C 6.6 (H) 08/11/2019     Assessment:   1. Encounter for well woman exam with routine gynecological exam   2. Breast  cancer screening by mammogram   3. Menopause   4. History of colon polyps   5. 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 Tobacco abuse   7. Osteopenia after menopause    Plan:  Pap: Not done. Due in November. Will perform next year with annual.  Mammogram: Ordered Stool Guaiac Testing:  Not Indicated/ Colonoscopy up to date. Most likely will need repeat in 3-5 years.  Labs: To be performed by PCP.  Routine preventative health maintenance measures emphasized: Exercise/Diet/Weight control, Tobacco Warnings, Alcohol/Substance use risks, Stress Management.   Encouraged use of Vitamin D and calcium supplementation as she has a history of osteopenia.  Declines COVID vaccination info.  Declines flu vaccine.  Return to [place], MD  [facility]

## 2019-08-23 NOTE — Patient Instructions (Signed)
Preventive Care 40-59 Years Old, Female Preventive care refers to visits with your health care provider and lifestyle choices that can promote health and wellness. This includes:  A yearly physical exam. This may also be called an annual well check.  Regular dental visits and eye exams.  Immunizations.  Screening for certain conditions.  Healthy lifestyle choices, such as eating a healthy diet, getting regular exercise, not using drugs or products that contain nicotine and tobacco, and limiting alcohol use. What can I expect for my preventive care visit? Physical exam Your health care provider will check your:  Height and weight. This may be used to calculate body mass index (BMI), which tells if you are at a healthy weight.  Heart rate and blood pressure.  Skin for abnormal spots. Counseling Your health care provider may ask you questions about your:  Alcohol, tobacco, and drug use.  Emotional well-being.  Home and relationship well-being.  Sexual activity.  Eating habits.  Work and work environment.  Method of birth control.  Menstrual cycle.  Pregnancy history. What immunizations do I need?  Influenza (flu) vaccine  This is recommended every year. Tetanus, diphtheria, and pertussis (Tdap) vaccine  You may need a Td booster every 10 years. Varicella (chickenpox) vaccine  You may need this if you have not been vaccinated. Zoster (shingles) vaccine  You may need this after age 60. Measles, mumps, and rubella (MMR) vaccine  You may need at least one dose of MMR if you were born in 1957 or later. You may also need a second dose. Pneumococcal conjugate (PCV13) vaccine  You may need this if you have certain conditions and were not previously vaccinated. Pneumococcal polysaccharide (PPSV23) vaccine  You may need one or two doses if you smoke cigarettes or if you have certain conditions. Meningococcal conjugate (MenACWY) vaccine  You may need this if you  have certain conditions. Hepatitis A vaccine  You may need this if you have certain conditions or if you travel or work in places where you may be exposed to hepatitis A. Hepatitis B vaccine  You may need this if you have certain conditions or if you travel or work in places where you may be exposed to hepatitis B. Haemophilus influenzae type b (Hib) vaccine  You may need this if you have certain conditions. Human papillomavirus (HPV) vaccine  If recommended by your health care provider, you may need three doses over 6 months. You may receive vaccines as individual doses or as more than one vaccine together in one shot (combination vaccines). Talk with your health care provider about the risks and benefits of combination vaccines. What tests do I need? Blood tests  Lipid and cholesterol levels. These may be checked every 5 years, or more frequently if you are over 50 years old.  Hepatitis C test.  Hepatitis B test. Screening  Lung cancer screening. You may have this screening every year starting at age 55 if you have a 30-pack-year history of smoking and currently smoke or have quit within the past 15 years.  Colorectal cancer screening. All adults should have this screening starting at age 50 and continuing until age 75. Your health care provider may recommend screening at age 45 if you are at increased risk. You will have tests every 1-10 years, depending on your results and the type of screening test.  Diabetes screening. This is done by checking your blood sugar (glucose) after you have not eaten for a while (fasting). You may have this   done every 1-3 years.  Mammogram. This may be done every 1-2 years. Talk with your health care provider about when you should start having regular mammograms. This may depend on whether you have a family history of breast cancer.  BRCA-related cancer screening. This may be done if you have a family history of breast, ovarian, tubal, or peritoneal  cancers.  Pelvic exam and Pap test. This may be done every 3 years starting at age 60. Starting at age 7, this may be done every 5 years if you have a Pap test in combination with an HPV test. Other tests  Sexually transmitted disease (STD) testing.  Bone density scan. This is done to screen for osteoporosis. You may have this scan if you are at high risk for osteoporosis. Follow these instructions at home: Eating and drinking  Eat a diet that includes fresh fruits and vegetables, whole grains, lean protein, and low-fat dairy.  Take vitamin and mineral supplements as recommended by your health care provider.  Do not drink alcohol if: ? Your health care provider tells you not to drink. ? You are pregnant, may be pregnant, or are planning to become pregnant.  If you drink alcohol: ? Limit how much you have to 0-1 drink a day. ? Be aware of how much alcohol is in your drink. In the [place] one drink equals one 12 oz bottle of beer (355 mL), one 5 oz glass of wine (148 mL), or one 1 oz glass of hard liquor (44 mL). Lifestyle  Take daily care of your teeth and gums.  Stay active. Exercise for at least 30 minutes on 5 or more days each week.  Do not use any products that contain nicotine or tobacco, such as cigarettes, e-cigarettes, and chewing tobacco. If you need help quitting, ask your health care provider.  If you are sexually active, practice safe sex. Use a condom or other form of birth control (contraception) in order to prevent pregnancy and STIs (sexually transmitted infections).  If told by your health care provider, take low-dose aspirin daily starting at age 48. What's next?  Visit your health care provider once a year for a well check visit.  Ask your health care provider how often you should have your eyes and teeth checked.  Stay up to date on all vaccines. This information is not intended to replace advice given to you by your health care provider. Make sure you  discuss any questions you have with your health care provider. Document Revised: 01/20/2018 Document Reviewed: 01/20/2018 Elsevier Patient Education  2020 [place] Breast self-awareness is knowing how your breasts look and feel. Doing breast self-awareness is important. It allows you to catch a breast problem early while it is still small and can be treated. All women should do breast self-awareness, including women who have had breast implants. Tell your doctor if you notice a change in your breasts. What you need:  A mirror.  A well-lit room. How to do a breast self-exam A breast self-exam is one way to learn what is normal for your breasts and to check for changes. To do a breast self-exam: Look for changes  1. Take off all the clothes above your waist. 2. Stand in front of a mirror in a room with good lighting. 3. Put your hands on your hips. 4. Push your hands down. 5. Look at your breasts and nipples in the mirror to see if one breast or nipple looks different from the  other. Check to see if: ? The shape of one breast is different. ? The size of one breast is different. ? There are wrinkles, dips, and bumps in one breast and not the other. 6. Look at each breast for changes in the skin, such as: ? Redness. ? Scaly areas. 7. Look for changes in your nipples, such as: ? Liquid around the nipples. ? Bleeding. ? Dimpling. ? Redness. ? A change in where the nipples are. Feel for changes  1. Lie on your back on the floor. 2. Feel each breast. To do this, follow these steps: ? Pick a breast to feel. ? Put the arm closest to that breast above your head. ? Use your other arm to feel the nipple area of your breast. Feel the area with the pads of your three middle fingers by making small circles with your fingers. For the first circle, press lightly. For the second circle, press harder. For the third circle, press even harder. ? Keep making circles with  your fingers at the different pressures as you move down your breast. Stop when you feel your ribs. ? Move your fingers a little toward the center of your body. ? Start making circles with your fingers again, this time going up until you reach your collarbone. ? Keep making up-and-down circles until you reach your armpit. Remember to keep using the three pressures. ? Feel the other breast in the same way. 3. Sit or stand in the tub or shower. 4. With soapy water on your skin, feel each breast the same way you did in step 2 when you were lying on the floor. Write down what you find Writing down what you find can help you remember what to tell your doctor. Write down:  What is normal for each breast.  Any changes you find in each breast, including: ? The kind of changes you find. ? Whether you have pain. ? Size and location of any lumps.  When you last had your menstrual period. General tips  Check your breasts every month.  If you are breastfeeding, the best time to check your breasts is after you feed your baby or after you use a breast pump.  If you get menstrual periods, the best time to check your breasts is 5-7 days after your menstrual period is over.  With time, you will become comfortable with the self-exam, and you will begin to know if there are changes in your breasts. Contact a doctor if you:  See a change in the shape or size of your breasts or nipples.  See a change in the skin of your breast or nipples, such as red or scaly skin.  Have fluid coming from your nipples that is not normal.  Find a lump or thick area that was not there before.  Have pain in your breasts.  Have any concerns about your breast health. Summary  Breast self-awareness includes looking for changes in your breasts, as well as feeling for changes within your breasts.  Breast self-awareness should be done in front of a mirror in a well-lit room.  You should check your breasts every month.  If you get menstrual periods, the best time to check your breasts is 5-7 days after your menstrual period is over.  Let your doctor know of any changes you see in your breasts, including changes in size, changes on the skin, pain or tenderness, or fluid from your nipples that is not normal. This information is not  intended to replace advice given to you by your health care provider. Make sure you discuss any questions you have with your health care provider. Document Revised: 12/28/2017 Document Reviewed: 12/28/2017 Elsevier Patient Education  [place].

## 2019-08-23 NOTE — Progress Notes (Signed)
Pt present for annual exam. Pt stated that she was doing well no problems.  

## 2019-08-24 ENCOUNTER — Ambulatory Visit (INDEPENDENT_AMBULATORY_CARE_PROVIDER_SITE_OTHER): Payer: 59 | Admitting: Vascular Surgery

## 2019-08-24 ENCOUNTER — Encounter (INDEPENDENT_AMBULATORY_CARE_PROVIDER_SITE_OTHER): Payer: Self-pay | Admitting: Vascular Surgery

## 2019-08-24 VITALS — BP 134/61 | HR 68 | Resp 16 | Ht 60.0 in | Wt 133.0 lb

## 2019-08-24 DIAGNOSIS — I6521 Occlusion and stenosis of right carotid artery: Secondary | ICD-10-CM

## 2019-08-25 ENCOUNTER — Encounter (INDEPENDENT_AMBULATORY_CARE_PROVIDER_SITE_OTHER): Payer: Self-pay | Admitting: Vascular Surgery

## 2019-08-25 NOTE — Progress Notes (Signed)
Patient ID: Andrea Rogers, female   DOB: 1961/02/06, 59 y.o.   MRN: [id number]  Chief Complaint  Patient presents with  . Follow-up    2wks [facility]. post endarterectomy     HPI Andrea Rogers is a 59 y.o. female.    Patient is status post right carotid endarterectomy with CorMatrix patch angioplasty on 08/11/2019.  There have been no significant postoperative issues.  She did note some headache post procedure but this has resolved.  She continues to complain of numbness under her jaw.   Past Medical History:  Diagnosis Date  . Anal condyloma   . Arthritis   . Bronchitis   . Diabetes mellitus without complication ([place])   . GERD (gastroesophageal reflux disease)   . Headache   . HLD (hyperlipidemia)   . Hypertension   . Hypothyroidism   . MD (muscular dystrophy) ([place])   . Menopause   . Muscular dystrophy, limb girdle ([place])   . Osteopenia   . Recurrent UTI   . Stroke [facility]) 02/2019   TIA  . Thyroid disease   . Tobacco abuse     Past Surgical History:  Procedure Laterality Date  . COLONOSCOPY WITH PROPOFOL N/A 04/25/2018   Procedure: COLONOSCOPY WITH PROPOFOL;  Surgeon: Lollie Sails, MD;  Location: [facility] ENDOSCOPY;  Service: Endoscopy;  Laterality: N/A;  . ENDARTERECTOMY Right 08/11/2019   Procedure: ENDARTERECTOMY CAROTID;  Surgeon: Katha Cabal, MD;  Location: [facility] ORS;  Service: Vascular;  Laterality: Right;  . NO PAST SURGERIES        Allergies  Allergen Reactions  . Latex Rash  . Penicillins Other (See Comments)    Yeast infection Did it involve swelling of the face/tongue/throat, SOB, or low BP? No Did it involve sudden or severe rash/hives, skin peeling, or any reaction on the inside of your mouth or nose? No Did you need to seek medical attention at a hospital or doctor's office? No When did it last happen? If all above answers are "NO", may proceed with cephalosporin use.     Current Outpatient Medications  Medication Sig Dispense Refill  .  albuterol (VENTOLIN HFA) 108 (90 Base) MCG/ACT inhaler Inhale 2 puffs into the lungs every 6 (six) hours as needed for wheezing or shortness of breath.     . ALPRAZolam (XANAX) 0.25 MG tablet Take 0.25 mg by mouth at bedtime as needed for anxiety or sleep.     Marland Kitchen aspirin EC 81 MG tablet Take 81 mg by mouth daily.    Marland Kitchen atenolol (TENORMIN) 100 MG tablet Take 200 mg by mouth daily.     Marland Kitchen ezetimibe (ZETIA) 10 MG tablet Take 10 mg by mouth at bedtime.     . gabapentin (NEURONTIN) 300 MG capsule Take 300-600 mg by mouth 3 (three) times daily.     Marland Kitchen glimepiride (AMARYL) 1 MG tablet Take 1 mg by mouth daily.     Marland Kitchen HYDROcodone-acetaminophen (NORCO) 7.5-325 MG tablet Take 1 tablet by mouth every 6 (six) hours as needed for moderate pain. Takes mostly at night    . levothyroxine (SYNTHROID, LEVOTHROID) 75 MCG tablet Take 75 mcg by mouth at bedtime.     . metFORMIN (GLUCOPHAGE) 500 MG tablet Take 1,000 mg by mouth 2 (two) times daily with a meal.     . omeprazole (PRILOSEC) 40 MG capsule Take 40 mg by mouth daily.    Marland Kitchen triamterene-hydrochlorothiazide (MAXZIDE) 75-50 MG tablet Take 1 tablet by mouth daily.  No current facility-administered medications for this visit.        Physical Exam BP 134/61 (BP Location: Right Arm)   Pulse 68   Resp 16   Ht 5' (1.524 m)   Wt 133 lb (60.3 kg)   LMP  (LMP Unknown)   BMI 25.97 kg/m  Gen:  WD/WN, NAD Skin: incision C/D/I; neurologic exam is baseline no new deficits     Assessment/Plan: 1. Carotid stenosis, symptomatic w/o infarct, right Recommend:  The patient is s/p successful right CEA  Duplex ultrasound preoperatively shows moderate contralateral stenosis.  Continue antiplatelet therapy as prescribed Continue management of CAD, HTN and Hyperlipidemia Healthy heart diet,  encouraged exercise at least 4 times per week  Follow up in 6 months with duplex ultrasound and physical exam based on the patient's carotid surgery    - VAS US CAROTID;  Future      Hortencia Pilar 08/25/2019, 1:38 PM   This note was created with Dragon medical transcription system.  Any errors from dictation are unintentional.

## 2019-09-29 ENCOUNTER — Encounter: Payer: Self-pay | Admitting: Obstetrics and Gynecology

## 2019-09-29 DIAGNOSIS — Z1231 Encounter for screening mammogram for malignant neoplasm of breast: Secondary | ICD-10-CM | POA: Diagnosis not present

## 2019-10-05 ENCOUNTER — Telehealth: Payer: Self-pay

## 2019-10-05 NOTE — Telephone Encounter (Signed)
Pt is aware that her mammogram results were negative.

## 2019-10-10 DIAGNOSIS — E538 Deficiency of other specified B group vitamins: Secondary | ICD-10-CM | POA: Diagnosis not present

## 2019-10-10 DIAGNOSIS — E611 Iron deficiency: Secondary | ICD-10-CM | POA: Diagnosis not present

## 2019-10-10 DIAGNOSIS — E559 Vitamin D deficiency, unspecified: Secondary | ICD-10-CM | POA: Diagnosis not present

## 2019-10-10 DIAGNOSIS — R790 Abnormal level of blood mineral: Secondary | ICD-10-CM | POA: Diagnosis not present

## 2019-10-10 DIAGNOSIS — G2581 Restless legs syndrome: Secondary | ICD-10-CM | POA: Diagnosis not present

## 2019-10-19 DIAGNOSIS — K023 Arrested dental caries: Secondary | ICD-10-CM | POA: Diagnosis not present

## 2019-12-18 DIAGNOSIS — E559 Vitamin D deficiency, unspecified: Secondary | ICD-10-CM | POA: Diagnosis not present

## 2019-12-18 DIAGNOSIS — E1165 Type 2 diabetes mellitus with hyperglycemia: Secondary | ICD-10-CM | POA: Diagnosis not present

## 2019-12-18 DIAGNOSIS — I10 Essential (primary) hypertension: Secondary | ICD-10-CM | POA: Diagnosis not present

## 2019-12-18 DIAGNOSIS — Z720 Tobacco use: Secondary | ICD-10-CM | POA: Diagnosis not present

## 2019-12-18 DIAGNOSIS — G459 Transient cerebral ischemic attack, unspecified: Secondary | ICD-10-CM | POA: Diagnosis not present

## 2019-12-20 DIAGNOSIS — I10 Essential (primary) hypertension: Secondary | ICD-10-CM | POA: Diagnosis not present

## 2019-12-20 DIAGNOSIS — I498 Other specified cardiac arrhythmias: Secondary | ICD-10-CM | POA: Diagnosis not present

## 2019-12-20 DIAGNOSIS — I6523 Occlusion and stenosis of bilateral carotid arteries: Secondary | ICD-10-CM | POA: Diagnosis not present

## 2019-12-20 DIAGNOSIS — G459 Transient cerebral ischemic attack, unspecified: Secondary | ICD-10-CM | POA: Diagnosis not present

## 2019-12-25 DIAGNOSIS — R448 Other symptoms and signs involving general sensations and perceptions: Secondary | ICD-10-CM | POA: Diagnosis not present

## 2019-12-25 DIAGNOSIS — R69 Illness, unspecified: Secondary | ICD-10-CM | POA: Diagnosis not present

## 2019-12-25 DIAGNOSIS — G7109 Other specified muscular dystrophies: Secondary | ICD-10-CM | POA: Diagnosis not present

## 2019-12-25 DIAGNOSIS — E1165 Type 2 diabetes mellitus with hyperglycemia: Secondary | ICD-10-CM | POA: Diagnosis not present

## 2019-12-25 DIAGNOSIS — I10 Essential (primary) hypertension: Secondary | ICD-10-CM | POA: Diagnosis not present

## 2019-12-25 DIAGNOSIS — Z79899 Other long term (current) drug therapy: Secondary | ICD-10-CM | POA: Diagnosis not present

## 2019-12-25 DIAGNOSIS — E039 Hypothyroidism, unspecified: Secondary | ICD-10-CM | POA: Diagnosis not present

## 2019-12-25 DIAGNOSIS — Z720 Tobacco use: Secondary | ICD-10-CM | POA: Diagnosis not present

## 2020-02-19 DIAGNOSIS — R790 Abnormal level of blood mineral: Secondary | ICD-10-CM | POA: Diagnosis not present

## 2020-02-19 DIAGNOSIS — G2581 Restless legs syndrome: Secondary | ICD-10-CM | POA: Diagnosis not present

## 2020-02-19 DIAGNOSIS — G7109 Other specified muscular dystrophies: Secondary | ICD-10-CM | POA: Diagnosis not present

## 2020-02-26 ENCOUNTER — Ambulatory Visit (INDEPENDENT_AMBULATORY_CARE_PROVIDER_SITE_OTHER): Payer: Medicare Other | Admitting: Vascular Surgery

## 2020-02-26 ENCOUNTER — Encounter (INDEPENDENT_AMBULATORY_CARE_PROVIDER_SITE_OTHER): Payer: Medicare Other

## 2020-03-06 ENCOUNTER — Other Ambulatory Visit: Payer: Self-pay | Admitting: *Deleted

## 2020-03-06 NOTE — Patient Outreach (Signed)
Andrea Rogers [facility]) Care Management  03/06/2020  Andrea Rogers 02-05-1961 [id number]   [facility] Telephone Assessment/Screen for [employer] insurance referral  Referral Date: 02/27/20 Referral Source: Andrea Rogers Referral Reason:for Complex Rogers and the HTN initiative-non-urgent follow up in < 10 days per Andrea Rogers.  Additional information on the patient: B/P 110/70, DM A1C= 6.4, Hypothyroid, + smoker, Muscular dystrophy, Lt CEA p TIAs.   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 Anal condyloma   . Arthritis   . Bronchitis   . Diabetes mellitus without complication ([place])   . GERD (gastroesophageal reflux disease)   . Headache   . HLD (hyperlipidemia)   . Hypertension   . Hypothyroidism   . MD (muscular dystrophy) ([place])   . Menopause   . Muscular dystrophy, limb girdle ([place])   . Osteopenia   . Recurrent UTI   . Stroke [facility]) 02/2019   TIA  . Thyroid disease   . Tobacco abuse     DME: has glucometer not sure where it is or if works, reports old   Medications: denies concerns with taking medications as prescribed, affording medications, side effects of medications and questions about medications   Advance Directives: none   Consent: Andrea Rogers reviewed Andrea Rogers services with patient. Patient gave verbal consent for services Andrea Rogers telephonic RN Rogers.   Plan: [facility] RN Rogers will follow up with Andrea Rogers in the next 37 business days    Pt encouraged to return a call to [facility] prn Routed note to MD [facility] RN Rogers sent a Gaffer with Andrea Rogers brochure, Magnet, [facility] consent form with return envelope and know before you go sheet enclosed for review  Goals Addressed              This Visit's Progress     Patient Stated   .  Friends Rogers) Monitor and Manage My Blood Sugar (pt-stated)   On track     Follow Up Date 03/21/20  - check blood sugar at prescribed times - check blood sugar if I feel it is too high or too low - take the blood sugar log to all doctor visits - take the blood sugar meter to all doctor visits    Why is this important?   Checking your blood sugar at home helps to keep it from getting very high or very low.  Writing the results in a diary or log helps the doctor know how to care for you.  Your blood sugar log should have the time, date and the results.  Also, write down the amount of insulin or other medicine that you take.  Other information, like what you ate, exercise done and how you were feeling, will also be helpful.     Notes: A 1c 6.4     .  (Andrea)  Track and Manage My Blood Pressure (pt-stated)   On track     Follow Up Date 03/21/20   - check blood pressure weekly - choose a place to take my blood pressure (home, clinic or office, retail store)    Why is this important?   You won't feel high blood pressure, but it can still hurt your blood vessels.  High blood pressure can cause heart or kidney problems. It can also cause a stroke.  Making lifestyle changes like losing a little weight or eating less salt will help.  Checking your blood pressure at home and at different times of the day can help to control blood pressure.  If the doctor prescribes medicine remember to take it the way the doctor ordered.  Call the office if you cannot afford the medicine or if there are questions about it.     Notes:         Cypress Fanfan L. Lavina Hamman, RN, BSN, [place]  Coordinator Office number [phone] Mobile number [phone]  Main Andrea number [phone] Fax number [phone]

## 2020-03-15 ENCOUNTER — Other Ambulatory Visit: Payer: Self-pay

## 2020-03-15 ENCOUNTER — Ambulatory Visit (INDEPENDENT_AMBULATORY_CARE_PROVIDER_SITE_OTHER): Payer: 59 | Admitting: Nurse Practitioner

## 2020-03-15 ENCOUNTER — Encounter (INDEPENDENT_AMBULATORY_CARE_PROVIDER_SITE_OTHER): Payer: Self-pay | Admitting: Nurse Practitioner

## 2020-03-15 ENCOUNTER — Ambulatory Visit (INDEPENDENT_AMBULATORY_CARE_PROVIDER_SITE_OTHER): Payer: 59

## 2020-03-15 VITALS — BP 144/76 | HR 92 | Resp 16 | Wt 137.2 lb

## 2020-03-15 DIAGNOSIS — I10 Essential (primary) hypertension: Secondary | ICD-10-CM | POA: Diagnosis not present

## 2020-03-15 DIAGNOSIS — I6523 Occlusion and stenosis of bilateral carotid arteries: Secondary | ICD-10-CM | POA: Diagnosis not present

## 2020-03-15 DIAGNOSIS — I6521 Occlusion and stenosis of right carotid artery: Secondary | ICD-10-CM

## 2020-03-15 DIAGNOSIS — E782 Mixed hyperlipidemia: Secondary | ICD-10-CM | POA: Diagnosis not present

## 2020-03-15 DIAGNOSIS — Z720 Tobacco use: Secondary | ICD-10-CM | POA: Diagnosis not present

## 2020-03-15 NOTE — Progress Notes (Signed)
Subjective:    Patient ID: Andrea Rogers, female    DOB: [birth year], 59 y.o.   MRN: [id number] Chief Complaint  Patient presents with  . Follow-up    79month ultrasound follow up    The patient is seen for follow up evaluation of carotid stenosis. The carotid stenosis followed by ultrasound.   The patient denies amaurosis fugax. There is no recent history of TIA symptoms or focal motor deficits. There is no prior documented CVA.  The patient is taking enteric-coated aspirin 81 mg daily.  There is no history of migraine headaches. There is no history of seizures.  The patient has a history of coronary artery disease, no recent episodes of angina or shortness of breath. The patient denies PAD or claudication symptoms. There is a history of hyperlipidemia which is being treated with a statin.    Carotid Duplex done today shows 1-39%.  No change compared to last study.  Had right carotid endarterectomy on 08/11/2019   Review of Systems  Eyes: Negative for visual disturbance.  Neurological: Negative for weakness.  All other systems reviewed and are negative.      Objective:   Physical Exam Vitals reviewed.  Neck:     Vascular: No carotid bruit.  Cardiovascular:     Rate and Rhythm: Normal rate and regular rhythm.     Pulses: Normal pulses.     Heart sounds: Normal heart sounds.  Pulmonary:     Effort: Pulmonary effort is normal.  Neurological:     Mental Status: She is alert and oriented to person, place, and time.  Psychiatric:        Mood and Affect: Mood normal.        Behavior: Behavior normal.        Thought Content: Thought content normal.        Judgment: Judgment normal.     BP (!) 144/76 (BP Location: Right Arm)   Pulse 92   Resp 16   Wt 137 lb 3.2 oz (62.2 kg)   LMP  (LMP Unknown)   BMI 26.80 kg/m   Past Medical History:  Diagnosis Date  . Anal condyloma   . Arthritis   . Bronchitis   . Diabetes mellitus without complication ([place])   . GERD  (gastroesophageal reflux disease)   . Headache   . HLD (hyperlipidemia)   . Hypertension   . Hypothyroidism   . MD (muscular dystrophy) ([place])   . Menopause   . Muscular dystrophy, limb girdle ([place])   . Osteopenia   . Recurrent UTI   . Stroke [facility]) 02/2019   TIA  . Thyroid disease   . Tobacco abuse     Social History   Socioeconomic History  . Marital status: Married    Spouse name: Not on file  . Number of children: Not on file  . Years of education: Not on file  . Highest education level: Not on file  Occupational History  . Not on file  Tobacco Use  . Smoking status: Current Every Day Smoker    Packs/day: 1.00    Types: Cigarettes  . Smokeless tobacco: Never Used  Vaping Use  . Vaping Use: Never used  Substance and Sexual Activity  . Alcohol use: No  . Drug use: No  . Sexual activity: Not Currently    Birth control/protection: Post-menopausal  Other Topics Concern  . Not on file  Social History Narrative  . Not on file   Social Determinants of Health  Financial Resource Strain:   . Difficulty of Paying Living Expenses: Not on file  Food Insecurity: No Food Insecurity  . Worried About Charity fundraiser in the Last Year: Never true  . Ran Out of Food in the Last Year: Never true  Transportation Needs: No Transportation Needs  . Lack of Transportation (Medical): No  . Lack of Transportation (Non-Medical): No  Physical Activity:   . Days of Exercise per Week: Not on file  . Minutes of Exercise per Session: Not on file  Stress:   . Feeling of Stress : Not on file  Social Connections:   . Frequency of Communication with Friends and Family: Not on file  . Frequency of Social Gatherings with Friends and Family: Not on file  . Attends Religious Services: Not on file  . Active Member of Clubs or Organizations: Not on file  . Attends Archivist Meetings: Not on file  . Marital Status: Not on file  Intimate Partner Violence:   . Fear of Current or  Ex-Partner: Not on file  . Emotionally Abused: Not on file  . Physically Abused: Not on file  . Sexually Abused: Not on file    Past Surgical History:  Procedure Laterality Date  . COLONOSCOPY WITH PROPOFOL N/A 04/25/2018   Procedure: COLONOSCOPY WITH PROPOFOL;  Surgeon: Lollie Sails, MD;  [facility] ENDOSCOPY;  Service: Endoscopy;  Laterality: N/A;  . ENDARTERECTOMY Right 08/11/2019   Procedure: ENDARTERECTOMY CAROTID;  Surgeon: Katha Cabal, MD;  Location: [facility] ORS;  Service: Vascular;  Laterality: Right;  . NO PAST SURGERIES      Family History  Adopted: Yes  Problem Relation Age of Onset  . Breast cancer Mother   . Colon cancer Father   . Diabetes Father     Allergies  Allergen Reactions  . Latex Rash  . Penicillins Other (See Comments)    Yeast infection Did it involve swelling of the face/tongue/throat, SOB, or low BP? No Did it involve sudden or severe rash/hives, skin peeling, or any reaction on the inside of your mouth or nose? No Did you need to seek medical attention at a hospital or doctor's office? No When did it last happen? If all above answers are "NO", may proceed with cephalosporin use.        Assessment & Plan:   1. Carotid artery calcification, bilateral Recommend:  The patient is s/p successful right carotid endarterectomy on 08/11/2019.  Patient has 1 to 39% stenosis bilaterally  Continue antiplatelet therapy as prescribed Continue management of CAD, HTN and Hyperlipidemia Healthy heart diet,  encouraged exercise at least 4 times per week  Patient will follow up in 1 year with noninvasive studies.   2. Mixed hyperlipidemia Continue statin as ordered and reviewed, no changes at this time   3. Essential hypertension Continue antihypertensive medications as already ordered, these medications have been reviewed and there are no changes at this time.   4. Tobacco abuse Continued tobacco abuse will affect the  atherosclerotic progress.  Smoking cessation is encouraged.   Current Outpatient Medications on File Prior to Visit  Medication Sig Dispense Refill  . albuterol (VENTOLIN HFA) 108 (90 Base) MCG/ACT inhaler Inhale 2 puffs into the lungs every 6 (six) hours as needed for wheezing or shortness of breath.     . ALPRAZolam (XANAX) 0.25 MG tablet Take 0.25 mg by mouth at bedtime as needed for anxiety or sleep.     Marland Kitchen ALPRAZolam (XANAX) 0.5 MG tablet  Take 0.5 mg by mouth at bedtime as needed.    Marland Kitchen aspirin EC 81 MG tablet Take 81 mg by mouth daily.    Marland Kitchen atenolol (TENORMIN) 100 MG tablet Take 200 mg by mouth daily.     . ergocalciferol (VITAMIN D2) 1.25 MG (50000 UT) capsule Take by mouth.    . ezetimibe (ZETIA) 10 MG tablet Take 10 mg by mouth at bedtime.     . ferrous sulfate 325 (65 FE) MG tablet Take by mouth.    . gabapentin (NEURONTIN) 300 MG capsule Take 300-600 mg by mouth 3 (three) times daily.     Marland Kitchen glimepiride (AMARYL) 1 MG tablet Take 1 mg by mouth daily.     Marland Kitchen HYDROcodone-acetaminophen (NORCO) 7.5-325 MG tablet Take 1 tablet by mouth every 6 (six) hours as needed for moderate pain. Takes mostly at night    . levothyroxine (SYNTHROID, LEVOTHROID) 75 MCG tablet Take 75 mcg by mouth at bedtime.     . metFORMIN (GLUCOPHAGE) 500 MG tablet Take 1,000 mg by mouth 2 (two) times daily with a meal.     . omeprazole (PRILOSEC) 40 MG capsule Take 40 mg by mouth daily.    Marland Kitchen triamterene-hydrochlorothiazide (MAXZIDE) 75-50 MG tablet Take 1 tablet by mouth daily.      No current facility-administered medications on file prior to visit.    There are no Patient Instructions on file for this visit. No follow-ups on file.   Kris Hartmann, NP

## 2020-03-16 ENCOUNTER — Encounter (INDEPENDENT_AMBULATORY_CARE_PROVIDER_SITE_OTHER): Payer: Self-pay | Admitting: Nurse Practitioner

## 2020-03-21 ENCOUNTER — Other Ambulatory Visit: Payer: Self-pay | Admitting: *Deleted

## 2020-03-21 NOTE — Patient Outreach (Addendum)
[facility]) Care Management  03/21/2020  Andrea Rogers Mar 28, 1961 [id number]   [facility] Telephone Assessment/Screen for [employer] insurance referral  Referral Date: 02/27/20 Referral Source: [place] Referral Reason:for Complex CM and the HTN initiative-non-urgent follow up in < 10 days per [name].  Additional information on the patient: B/P 110/70, DM A1C= 6.4, Hypothyroid, + smoker, Muscular dystrophy, Lt CEA p TIAs.   Insurance: Government social research officer    Outreach attempt #2 A to preferred mobile number  Unable to leave a message as automated system reports pt is not available Outreach attempt #2 B to the home number No answer. [facility] RN CM left HIPAA [facility] Portability and Accountability Act) compliant voicemail message along with CM's contact info.   DME: has glucometer not sure where it is or if works, reports old    [facility] RN CM outreach to primary care provider (PCP) [phone] to request assistance with an order/Prescription (Rx) to her pharmacy, [facility] for a glucometer Spoke with Jeannine to have a message sent to MD or RN    Plan: [facility] RN CM scheduled this [facility] engaged patient for another call attempt within 7-10 business days  Andrea Rogers L. Andrea Hamman, RN, BSN, Andrea Rogers Coordinator Office number [phone] Main [facility] number [phone] Fax number [phone]

## 2020-04-01 ENCOUNTER — Other Ambulatory Visit: Payer: Self-pay | Admitting: *Deleted

## 2020-04-01 ENCOUNTER — Other Ambulatory Visit: Payer: Self-pay

## 2020-04-01 NOTE — Patient Outreach (Signed)
[place] [facility]) Care Management  04/01/2020  [name] Sep 30, 1960 [id number]   [facility] Telephone follow up outreach to [facility] insurance referral  Referral Date: 02/27/20 Referral Source: [employer]  Referral Reason:for Complex CM and the HTN initiative-non-urgent follow up in < 10 days per [name].  Additional information on the patient: B/P 110/70, DM A1C= 6.4, Hypothyroid, + smoker, Muscular dystrophy, Lt CEA p TIAs.   Insurance: Government social research officer    Outreach attempt successful  Patient is able to verify HIPAA identifiers Reviewed and addressed reason for follow up outreach   Andrea Rogers reports she is doing very good  She reports at this time she did receive [facility] welcome letter but states she is not interested in begin followed in [facility] program at this time She denies medical concerns at this time related to her Hypertension (HTN)  and Diabetes (DM) type 2  She monitors her BP and now cbg  at home and it remains within normal limits  She confirms she received her glucometer after [facility] collaboration with Dr Ginette Pitman office She reports "I just don't have the time and stay busy"  [name] RN CM voiced understanding, appreciation for being able to outreach to her and discussed case closure program Andrea Horseman voiced appreciation for outreach   [facility] case closure per pt request Letters to pt and PCP   Joelene Millin L. Lavina Hamman, RN, BSN, [place] Coordinator Office number [phone] Main [facility] number [phone] Fax number [phone]

## 2020-06-03 DIAGNOSIS — E785 Hyperlipidemia, unspecified: Secondary | ICD-10-CM | POA: Diagnosis not present

## 2020-06-03 DIAGNOSIS — H9202 Otalgia, left ear: Secondary | ICD-10-CM | POA: Insufficient documentation

## 2020-06-03 DIAGNOSIS — Z720 Tobacco use: Secondary | ICD-10-CM | POA: Diagnosis not present

## 2020-06-03 DIAGNOSIS — I10 Essential (primary) hypertension: Secondary | ICD-10-CM | POA: Diagnosis not present

## 2020-06-03 DIAGNOSIS — Z23 Encounter for immunization: Secondary | ICD-10-CM | POA: Diagnosis not present

## 2020-08-20 DIAGNOSIS — G7109 Other specified muscular dystrophies: Secondary | ICD-10-CM | POA: Diagnosis not present

## 2020-08-20 DIAGNOSIS — G2581 Restless legs syndrome: Secondary | ICD-10-CM | POA: Diagnosis not present

## 2020-08-26 DIAGNOSIS — E1165 Type 2 diabetes mellitus with hyperglycemia: Secondary | ICD-10-CM | POA: Diagnosis not present

## 2020-08-26 DIAGNOSIS — I10 Essential (primary) hypertension: Secondary | ICD-10-CM | POA: Diagnosis not present

## 2020-08-26 DIAGNOSIS — E039 Hypothyroidism, unspecified: Secondary | ICD-10-CM | POA: Diagnosis not present

## 2020-08-26 DIAGNOSIS — Z79899 Other long term (current) drug therapy: Secondary | ICD-10-CM | POA: Diagnosis not present

## 2020-08-27 ENCOUNTER — Ambulatory Visit (INDEPENDENT_AMBULATORY_CARE_PROVIDER_SITE_OTHER): Payer: 59 | Admitting: Obstetrics and Gynecology

## 2020-08-27 ENCOUNTER — Encounter: Payer: Self-pay | Admitting: Obstetrics and Gynecology

## 2020-08-27 ENCOUNTER — Other Ambulatory Visit (HOSPITAL_COMMUNITY)
Admission: RE | Admit: 2020-08-27 | Discharge: 2020-08-27 | Disposition: A | Discharge status: Home / Self Care | Payer: 59 | Source: Ambulatory Visit | Attending: Obstetrics and Gynecology | Admitting: Obstetrics and Gynecology

## 2020-08-27 ENCOUNTER — Other Ambulatory Visit: Payer: Self-pay

## 2020-08-27 VITALS — BP 134/73 | HR 65 | Ht 60.0 in | Wt 133.1 lb

## 2020-08-27 DIAGNOSIS — Z1231 Encounter for screening mammogram for malignant neoplasm of breast: Secondary | ICD-10-CM | POA: Diagnosis not present

## 2020-08-27 DIAGNOSIS — Z01419 Encounter for gynecological examination (general) (routine) without abnormal findings: Secondary | ICD-10-CM | POA: Diagnosis not present

## 2020-08-27 DIAGNOSIS — M8580 Other specified disorders of bone density and structure, unspecified site: Secondary | ICD-10-CM | POA: Diagnosis not present

## 2020-08-27 DIAGNOSIS — E039 Hypothyroidism, unspecified: Secondary | ICD-10-CM | POA: Diagnosis not present

## 2020-08-27 DIAGNOSIS — Z780 Asymptomatic menopausal state: Secondary | ICD-10-CM

## 2020-08-27 DIAGNOSIS — Z124 Encounter for screening for malignant neoplasm of cervix: Secondary | ICD-10-CM | POA: Insufficient documentation

## 2020-08-27 DIAGNOSIS — I10 Essential (primary) hypertension: Secondary | ICD-10-CM | POA: Diagnosis not present

## 2020-08-27 DIAGNOSIS — N952 Postmenopausal atrophic vaginitis: Secondary | ICD-10-CM | POA: Diagnosis not present

## 2020-08-27 DIAGNOSIS — G7109 Other specified muscular dystrophies: Secondary | ICD-10-CM

## 2020-08-27 DIAGNOSIS — G71039 Limb girdle muscular dystrophy, unspecified: Secondary | ICD-10-CM

## 2020-08-27 NOTE — Patient Instructions (Signed)
Preventive Care 60-60 Years Old, Female Preventive care refers to lifestyle choices and visits with your health care provider that can promote health and wellness. This includes:  A yearly physical exam. This is also called an annual wellness visit.  Regular dental and eye exams.  Immunizations.  Screening for certain conditions.  Healthy lifestyle choices, such as: ? Eating a healthy diet. ? Getting regular exercise. ? Not using drugs or products that contain nicotine and tobacco. ? Limiting alcohol use. What can I expect for my preventive care visit? Physical exam Your health care provider will check your:  Height and weight. These may be used to calculate your BMI (body mass index). BMI is a measurement that tells if you are at a healthy weight.  Heart rate and blood pressure.  Body temperature.  Skin for abnormal spots. Counseling Your health care provider may ask you questions about your:  Past medical problems.  Family's medical history.  Alcohol, tobacco, and drug use.  Emotional well-being.  Home life and relationship well-being.  Sexual activity.  Diet, exercise, and sleep habits.  Work and work Statistician.  Access to firearms.  Method of birth control.  Menstrual cycle.  Pregnancy history. What immunizations do I need? Vaccines are usually given at various ages, according to a schedule. Your health care provider will recommend vaccines for you based on your age, medical history, and lifestyle or other factors, such as travel or where you work.   What tests do I need? Blood tests  Lipid and cholesterol levels. These may be checked every 5 years, or more often if you are over 60 years old.  Hepatitis C test.  Hepatitis B test. Screening  Lung cancer screening. You may have this screening every year starting at age 60 if you have a 30-pack-year history of smoking and currently smoke or have quit within the past 15 years.  Colorectal cancer  screening. ? All adults should have this screening starting at age 60 and continuing until age 60. ? Your health care provider may recommend screening at age 60 if you are at increased risk. ? You will have tests every 1-10 years, depending on your results and the type of screening test.  Diabetes screening. ? This is done by checking your blood sugar (glucose) after you have not eaten for a while (fasting). ? You may have this done every 1-3 years.  Mammogram. ? This may be done every 1-2 years. ? Talk with your health care provider about when you should start having regular mammograms. This may depend on whether you have a family history of breast cancer.  BRCA-related cancer screening. This may be done if you have a family history of breast, ovarian, tubal, or peritoneal cancers.  Pelvic exam and Pap test. ? This may be done every 3 years starting at age 60. ? Starting at age 11, this may be done every 5 years if you have a Pap test in combination with an HPV test. Other tests  STD (sexually transmitted disease) testing, if you are at risk.  Bone density scan. This is done to screen for osteoporosis. You may have this scan if you are at high risk for osteoporosis. Talk with your health care provider about your test results, treatment options, and if necessary, the need for more tests. Follow these instructions at home: Eating and drinking  Eat a diet that includes fresh fruits and vegetables, whole grains, lean protein, and low-fat dairy products.  Take vitamin and mineral supplements  as recommended by your health care provider.  Do not drink alcohol if: ? Your health care provider tells you not to drink. ? You are pregnant, may be pregnant, or are planning to become pregnant.  If you drink alcohol: ? Limit how much you have to 0-1 drink a day. ? Be aware of how much alcohol is in your drink. In the [place] one drink equals one 12 oz bottle of beer (355 mL), one 5 oz glass of  wine (148 mL), or one 1 oz glass of hard liquor (44 mL).   Lifestyle  Take daily care of your teeth and gums. Brush your teeth every morning and night with fluoride toothpaste. Floss one time each day.  Stay active. Exercise for at least 30 minutes 5 or more days each week.  Do not use any products that contain nicotine or tobacco, such as cigarettes, e-cigarettes, and chewing tobacco. If you need help quitting, ask your health care provider.  Do not use drugs.  If you are sexually active, practice safe sex. Use a condom or other form of protection to prevent STIs (sexually transmitted infections).  If you do not wish to become pregnant, use a form of birth control. If you plan to become pregnant, see your health care provider for a prepregnancy visit.  If told by your health care provider, take low-dose aspirin daily starting at age 60.  Find healthy ways to cope with stress, such as: ? Meditation, yoga, or listening to music. ? Journaling. ? Talking to a trusted person. ? Spending time with friends and family. Safety  Always wear your seat belt while driving or riding in a vehicle.  Do not drive: ? If you have been drinking alcohol. Do not ride with someone who has been drinking. ? When you are tired or distracted. ? While texting.  Wear a helmet and other protective equipment during sports activities.  If you have firearms in your house, make sure you follow all gun safety procedures. What's next?  Visit your health care provider once a year for an annual wellness visit.  Ask your health care provider how often you should have your eyes and teeth checked.  Stay up to date on all vaccines. This information is not intended to replace advice given to you by your health care provider. Make sure you discuss any questions you have with your health care provider. Document Revised: 02/13/2020 Document Reviewed: 01/20/2018 Elsevier Patient Education  2021 [place] Breast self-awareness is knowing how your breasts look and feel. Doing breast self-awareness is important. It allows you to catch a breast problem early while it is still small and can be treated. All women should do breast self-awareness, including women who have had breast implants. Tell your doctor if you notice a change in your breasts. What you need:  A mirror.  A well-lit room. How to do a breast self-exam A breast self-exam is one way to learn what is normal for your breasts and to check for changes. To do a breast self-exam: Look for changes 1. Take off all the clothes above your waist. 2. Stand in front of a mirror in a room with good lighting. 3. Put your hands on your hips. 4. Push your hands down. 5. Look at your breasts and nipples in the mirror to see if one breast or nipple looks different from the other. Check to see if: ? The shape of one breast is different. ? The size of  one breast is different. ? There are wrinkles, dips, and bumps in one breast and not the other. 6. Look at each breast for changes in the skin, such as: ? Redness. ? Scaly areas. 7. Look for changes in your nipples, such as: ? Liquid around the nipples. ? Bleeding. ? Dimpling. ? Redness. ? A change in where the nipples are.   Feel for changes 1. Lie on your back on the floor. 2. Feel each breast. To do this, follow these steps: ? Pick a breast to feel. ? Put the arm closest to that breast above your head. ? Use your other arm to feel the nipple area of your breast. Feel the area with the pads of your three middle fingers by making small circles with your fingers. For the first circle, press lightly. For the second circle, press harder. For the third circle, press even harder. ? Keep making circles with your fingers at the different pressures as you move down your breast. Stop when you feel your ribs. ? Move your fingers a little toward the center of your body. ? Start making  circles with your fingers again, this time going up until you reach your collarbone. ? Keep making up-and-down circles until you reach your armpit. Remember to keep using the three pressures. ? Feel the other breast in the same way. 3. Sit or stand in the tub or shower. 4. With soapy water on your skin, feel each breast the same way you did in step 2 when you were lying on the floor.   Write down what you find Writing down what you find can help you remember what to tell your doctor. Write down:  What is normal for each breast.  Any changes you find in each breast, including: ? The kind of changes you find. ? Whether you have pain. ? Size and location of any lumps.  When you last had your menstrual period. General tips  Check your breasts every month.  If you are breastfeeding, the best time to check your breasts is after you feed your baby or after you use a breast pump.  If you get menstrual periods, the best time to check your breasts is 5-7 days after your menstrual period is over.  With time, you will become comfortable with the self-exam, and you will begin to know if there are changes in your breasts. Contact a doctor if you:  See a change in the shape or size of your breasts or nipples.  See a change in the skin of your breast or nipples, such as red or scaly skin.  Have fluid coming from your nipples that is not normal.  Find a lump or thick area that was not there before.  Have pain in your breasts.  Have any concerns about your breast health. Summary  Breast self-awareness includes looking for changes in your breasts, as well as feeling for changes within your breasts.  Breast self-awareness should be done in front of a mirror in a well-lit room.  You should check your breasts every month. If you get menstrual periods, the best time to check your breasts is 5-7 days after your menstrual period is over.  Let your doctor know of any changes you see in your  breasts, including changes in size, changes on the skin, pain or tenderness, or fluid from your nipples that is not normal. This information is not intended to replace advice given to you by your health care provider. Make sure  you discuss any questions you have with your health care provider. Document Revised: 12/28/2017 Document Reviewed: 12/28/2017 Elsevier Patient Education  [place].

## 2020-08-27 NOTE — Progress Notes (Signed)
ANNUAL PREVENTATIVE CARE GYNECOLOGY  ENCOUNTER NOTE  Subjective:       Andrea Rogers is a 60 y.o. married G0P0000 postmenopausal female here for a routine annual gynecologic exam.  She has a PMH significant for muscular dystrophy, hypothyroidism, hypertension, and diabetes.She reports seeing her PCP for routine check last week. The patient has never taken hormone replacement therapy. She is not currently sexually active. Patient denies post-menopausal vaginal bleeding. The patient wears seatbelts: yes. The patient participates in regular exercise: no. Has the patient ever been transfused or tattooed?: no.    Current complaints: 1.  Patient reports that she has vulvar itching at night, has been ongoing for "some time". Denies vaginal discharge or odor. Sometimes feels like a yeast infection, but has taken several rounds of Diflucan and Vagisil wipes over time but symptoms never really resolve and often has further irritation.    Gynecologic History No LMP recorded (lmp unknown). Patient is postmenopausal. Contraception: post menopausal status Last Pap: 04/14/2017. Results were: normal Last mammogram: 09/29/2019. Results were: normal Last Colonoscopy: 04/25/2018. Results were: diverticula and ~ 6 colon polyps noted. Last Dexa Scan: Cannot recall date. Review of chart notes history of osteopenia.    Obstetric History OB History  Gravida Para Term Preterm AB Living  0 0 0 0 0 0  SAB IAB Ectopic Multiple Live Births  0 0 0 0 0    Past Medical History:  Diagnosis Date  . Anal condyloma   . Arthritis   . Bronchitis   . Diabetes mellitus without complication ([place])   . GERD (gastroesophageal reflux disease)   . Headache   . HLD (hyperlipidemia)   . Hypertension   . Hypothyroidism   . MD (muscular dystrophy) ([place])   . Menopause   . 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 Osteopenia   . Recurrent UTI   . Stroke [facility]) 02/2019   TIA  . Thyroid disease   . Tobacco abuse     Family  History  Adopted: Yes  Problem Relation Age of Onset  . Breast cancer Mother   . Colon cancer Father   . Diabetes Father     Past Surgical History:  Procedure Laterality Date  . COLONOSCOPY WITH PROPOFOL N/A 04/25/2018   Procedure: COLONOSCOPY WITH PROPOFOL;  Surgeon: Lollie Sails, MD;  Location: [facility] ENDOSCOPY;  Service: Endoscopy;  Laterality: N/A;  . ENDARTERECTOMY Right 08/11/2019   Procedure: ENDARTERECTOMY CAROTID;  Surgeon: Katha Cabal, MD;  Location: [facility] ORS;  Service: Vascular;  Laterality: Right;  . NO PAST SURGERIES      Social History   Socioeconomic History  . Marital status: Married    Spouse name: Not on file  . Number of children: Not on file  . Years of education: Not on file  . Highest education level: Not on file  Occupational History  . Not on file  Tobacco Use  . Smoking status: Current Every Day Smoker    Packs/day: 1.00    Types: Cigarettes  . Smokeless tobacco: Never Used  Vaping Use  . Vaping Use: Never used  Substance and Sexual Activity  . Alcohol use: No  . Drug use: No  . Sexual activity: Not Currently    Birth control/protection: Post-menopausal  Other Topics Concern  . Not on file  Social History Narrative  . Not on file   Social Determinants of Health   Financial Resource Strain: Not on file  Food Insecurity: No Food Insecurity  .  Worried About Charity fundraiser in the Last Year: Never true  . Ran Out of Food in the Last Year: Never true  Transportation Needs: No Transportation Needs  . Lack of Transportation (Medical): No  . Lack of Transportation (Non-Medical): No  Physical Activity: Not on file  Stress: Not on file  Social Connections: Not on file  Intimate Partner Violence: Not on file    Current Outpatient Medications on File Prior to Visit  Medication Sig Dispense Refill  . albuterol (VENTOLIN HFA) 108 (90 Base) MCG/ACT inhaler Inhale 2 puffs into the lungs every 6 (six) hours as needed for wheezing or  shortness of breath.     . ALPRAZolam (XANAX) 0.25 MG tablet Take 0.25 mg by mouth at bedtime as needed for anxiety or sleep.     Marland Kitchen ALPRAZolam (XANAX) 0.5 MG tablet Take 0.5 mg by mouth at bedtime as needed.    Marland Kitchen aspirin EC 81 MG tablet Take 81 mg by mouth daily.    Marland Kitchen atenolol (TENORMIN) 100 MG tablet Take 200 mg by mouth daily.     Marland Kitchen ezetimibe (ZETIA) 10 MG tablet Take 10 mg by mouth at bedtime.     . ferrous sulfate 325 (65 FE) MG tablet Take by mouth.    Marland Kitchen glimepiride (AMARYL) 1 MG tablet Take 1 mg by mouth daily.     Marland Kitchen HYDROcodone-acetaminophen (NORCO) 7.5-325 MG tablet Take 1 tablet by mouth every 6 (six) hours as needed for moderate pain. Takes mostly at night    . levothyroxine (SYNTHROID, LEVOTHROID) 75 MCG tablet Take 75 mcg by mouth at bedtime.     . metFORMIN (GLUCOPHAGE) 500 MG tablet Take 1,000 mg by mouth 2 (two) times daily with a meal.     . omeprazole (PRILOSEC) 40 MG capsule Take 40 mg by mouth daily.    Marland Kitchen triamterene-hydrochlorothiazide (MAXZIDE) 75-50 MG tablet Take 1 tablet by mouth daily.     Marland Kitchen gabapentin (NEURONTIN) 300 MG capsule Take 300-600 mg by mouth 3 (three) times daily.  (Patient not taking: Reported on 08/27/2020)     No current facility-administered medications on file prior to visit.    Allergies  Allergen Reactions  . Latex Rash  . Penicillins Other (See Comments)    Yeast infection Did it involve swelling of the face/tongue/throat, SOB, or low BP? No Did it involve sudden or severe rash/hives, skin peeling, or any reaction on the inside of your mouth or nose? No Did you need to seek medical attention at a hospital or doctor's office? No When did it last happen? If all above answers are "NO", may proceed with cephalosporin use.       Review of Systems ROS Review of Systems - General ROS: negative for - chills, fatigue, fever, hot flashes, night sweats, weight gain or weight loss Psychological ROS: negative for - anxiety, decreased libido,  depression, mood swings, physical abuse or sexual abuse Ophthalmic ROS: negative for - blurry vision, eye pain or loss of vision ENT ROS: negative for - headaches, hearing change, visual changes or vocal changes Allergy and Immunology ROS: negative for - hives, itchy/watery eyes or seasonal allergies Hematological and Lymphatic ROS: negative for - bleeding problems, bruising, swollen lymph nodes or weight loss Endocrine ROS: negative for - galactorrhea, hair pattern changes, hot flashes, malaise/lethargy, mood swings, palpitations, polydipsia/polyuria, skin changes, temperature intolerance or unexpected weight changes Breast ROS: negative for - new or changing breast lumps or nipple discharge Respiratory ROS: negative for - cough  or shortness of breath Cardiovascular ROS: negative for - chest pain, irregular heartbeat, palpitations or shortness of breath Gastrointestinal ROS: no abdominal pain, change in bowel habits, or black or bloody stools Genito-Urinary ROS: no dysuria, trouble voiding, or hematuria. Positive for vulvar and vaginal irritation/burning.  Musculoskeletal ROS: negative for - joint pain or joint stiffness Neurological ROS: negative for - bowel and bladder control changes Dermatological ROS: negative for rash and skin lesion changes   Objective:   BP 134/73   Pulse 65   Ht 5' (1.524 m)   Wt 133 lb 1.6 oz (60.4 kg)   LMP  (LMP Unknown)   BMI 25.99 kg/m  CONSTITUTIONAL: Well-developed, well-nourished female in no acute distress.  PSYCHIATRIC: Normal mood and affect. Normal behavior. Normal judgment and thought content. [place]: Alert and oriented to person, place, and time. Normal muscle tone coordination. No cranial nerve deficit noted. HENT:  Normocephalic, atraumatic, External right and left ear normal. Oropharynx is clear and moist EYES: Conjunctivae and EOM are normal. Pupils are equal, round, and reactive to light. No scleral icterus.  NECK: Normal range of motion,  supple, no masses.  Normal thyroid.  SKIN: Skin is warm and dry. No rash noted. Not diaphoretic. No erythema. No pallor. CARDIOVASCULAR: Normal heart rate noted, regular rhythm, no murmur. RESPIRATORY: Clear to auscultation bilaterally. Effort and breath sounds normal, no problems with respiration noted. BREASTS: Symmetric in size. No masses, skin changes, nipple drainage, or lymphadenopathy. ABDOMEN: Soft, normal bowel sounds, no distention noted.  No tenderness, rebound or guarding.  BLADDER: Normal PELVIC:  Bladder no bladder distension noted  Urethra: normal appearing urethra with no masses, tenderness or lesions  Vulva: normal appearing vulva with no masses, tenderness or lesions  Vagina: atrophic vagina (mild to moderate), no discharge. Small areas of hyperemia and excoriations noted at introitus. Difficult speculum exam due to vaginal tenderness.  Cervix: normal appearing cervix without discharge or lesions, difficult to visualize due to pain with exam.   Uterus: uterus is normal size, shape, consistency and nontender  Adnexa: normal adnexa in size, nontender and no masses  RV: External Exam NormaI. Declines rectal exam today.   MUSCULOSKELETAL: Normal range of motion. No tenderness.  No cyanosis, clubbing, or edema.  2+ distal pulses.  Hypertrophied calf-muscles.  LYMPHATIC: No Axillary, Supraclavicular, or Inguinal Adenopathy.   Labs: Performed by PCP, reviewed in Care Everywhwere  Assessment:   1. Encounter for well woman exam with routine gynecological exam   2. Pap smear for cervical cancer screening   3. Osteopenia after menopause   4. 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 Acquired hypothyroidism   6. Essential hypertension   7. Vaginal atrophy    Plan:  - Pap: Pap Co Test performed today, . Difficult collection due to patient discomfort with exam today.  - Mammogram: Ordered - Stool Guaiac Testing:  Not Indicated/ Colonoscopy up to date. Most likely will need  repeat in 3-5 years.  - Labs: Performed by PCP last week.   - Routine preventative health maintenance measures emphasized: Exercise/Diet/Weight control, Tobacco Warnings, Alcohol/Substance use risks, Stress Management.   - Encouraged use of Vitamin D and calcium supplementation as she has a history of osteopenia. Would recommend repeating DEXA scan, advised to discuss with PCP.  - Vaginal atrophy, given samples of Premarin cream to help with vulvar and vaginal symptoms.  Patient to follow-up in 2 weeks to reassess symptoms.  Discussed that she can use internally as well or can just use externally at  site of irritation. - Declines COVID vaccination.  - Declines flu vaccine.  - Return to [place], MD  [facility]

## 2020-08-27 NOTE — Progress Notes (Signed)
Annual exam-pt stated that she was doing well no problems.

## 2020-08-30 LAB — CYTOLOGY - PAP
Adequacy: ABSENT
Comment: NEGATIVE
Diagnosis: NEGATIVE
High risk HPV: NEGATIVE

## 2020-09-02 DIAGNOSIS — Z789 Other specified health status: Secondary | ICD-10-CM | POA: Insufficient documentation

## 2020-09-02 DIAGNOSIS — Z720 Tobacco use: Secondary | ICD-10-CM | POA: Diagnosis not present

## 2020-09-02 DIAGNOSIS — I10 Essential (primary) hypertension: Secondary | ICD-10-CM | POA: Diagnosis not present

## 2020-09-02 DIAGNOSIS — Z0000 Encounter for general adult medical examination without abnormal findings: Secondary | ICD-10-CM | POA: Diagnosis not present

## 2020-09-02 DIAGNOSIS — E1165 Type 2 diabetes mellitus with hyperglycemia: Secondary | ICD-10-CM | POA: Diagnosis not present

## 2020-09-02 DIAGNOSIS — R252 Cramp and spasm: Secondary | ICD-10-CM | POA: Insufficient documentation

## 2020-09-02 DIAGNOSIS — G2581 Restless legs syndrome: Secondary | ICD-10-CM | POA: Insufficient documentation

## 2020-09-11 ENCOUNTER — Ambulatory Visit (INDEPENDENT_AMBULATORY_CARE_PROVIDER_SITE_OTHER): Payer: 59 | Admitting: Obstetrics and Gynecology

## 2020-09-11 ENCOUNTER — Other Ambulatory Visit: Payer: Self-pay

## 2020-09-11 ENCOUNTER — Encounter: Payer: Self-pay | Admitting: Obstetrics and Gynecology

## 2020-09-11 VITALS — BP 136/74 | HR 70 | Ht 60.0 in | Wt 132.0 lb

## 2020-09-11 DIAGNOSIS — N905 Atrophy of vulva: Secondary | ICD-10-CM | POA: Diagnosis not present

## 2020-09-11 NOTE — Progress Notes (Signed)
    GYNECOLOGY PROGRESS NOTE  Subjective:    Patient ID: Andrea Rogers, female    DOB: July 07, 1960, 60 y.o.   MRN: [id number]  HPI  Patient is a 60 y.o. G0P0000 female who presents for 2 week follow up of vulvar-vaginal atrophy.  She was given samples of Premarin cream last visit.  Notes that she has just been using externally as she hasn't had many issues internally.  Was initially hesitant about using the cream as she has a family history of breast cancer in her mother (diagnosed age 62). She did proceed to using the cream though, and does note improvement in the vulvar irritation and itching.   The following portions of the patient's history were reviewed and updated as appropriate: allergies, current medications, past family history, past medical history, past social history, past surgical history and problem list.  Review of Systems Pertinent items noted in HPI and remainder of comprehensive ROS otherwise negative.  Objective:   Blood pressure 136/74, pulse 70, height 5' (1.524 m), weight 132 lb (59.9 kg). General appearance: alert and no distress Remainder of exam deferred.   Assessment:   Vulvar atrophy  Plan:   Discussed patient's concerns regarding use of local estrogen therapy due to family history.  After further discussion, patient ok to continue use short term for alleviation of symptoms. Was using daily, can now decreased to once or twice weekly.  Still has samples of cream left.  Will notify when prescription is needed.    RTC in 3 months to reassess vulvar tissue.    Rubie Maid, MD [facility]

## 2020-09-11 NOTE — Progress Notes (Signed)
Pt present for follow up for vulvar irritation.

## 2020-11-19 DIAGNOSIS — Z1231 Encounter for screening mammogram for malignant neoplasm of breast: Secondary | ICD-10-CM | POA: Diagnosis not present

## 2020-12-04 ENCOUNTER — Encounter: Payer: Self-pay | Admitting: Obstetrics and Gynecology

## 2020-12-04 ENCOUNTER — Ambulatory Visit (INDEPENDENT_AMBULATORY_CARE_PROVIDER_SITE_OTHER): Payer: 59 | Admitting: Obstetrics and Gynecology

## 2020-12-04 ENCOUNTER — Other Ambulatory Visit: Payer: Self-pay | Admitting: Obstetrics and Gynecology

## 2020-12-04 ENCOUNTER — Other Ambulatory Visit: Payer: Self-pay

## 2020-12-04 VITALS — BP 146/57 | HR 65 | Ht 60.0 in | Wt 131.8 lb

## 2020-12-04 DIAGNOSIS — N952 Postmenopausal atrophic vaginitis: Secondary | ICD-10-CM

## 2020-12-04 MED ORDER — INTRAROSA 6.5 MG VA INST: 1.0000 | VAGINAL_INSERT | Freq: Every evening | VAGINAL | 11 refills | Status: DC

## 2020-12-04 NOTE — Patient Instructions (Signed)
Prasterone vaginal insert What is this medication? PRASTERONE (PRAS ter one), also known as DEHYDROEPIANDROSTERONE (DHEA) is used to treat females who experience painful sexual intercourse, a symptom ofmenopause that occurs due to changes in and around the vagina. This medicine may be used for other purposes; ask your health care provider orpharmacist if you have questions. COMMON BRAND NAME(S): INTRAROSA What should I tell my care team before I take this medication? They need to know if you have any of these conditions: cancer, such as breast, uterine, or other cancer history of vaginal bleeding an unusual or allergic reaction to prasterone, DHEA, other hormones, medicines, foods, dyes, or preservatives pregnant or trying to get pregnant breast-feeding How should I use this medication? This medicine is for vaginal use only. Do not take by mouth. Follow the directions on the prescription label. Read package directions carefully before using. Wash hands before and after use. Use this medicine at bedtime. Do not use it more often than directed. Do not stop using except on your doctor'sadvice. Talk to your pediatrician regarding the use of this medicine in children. Thismedicine is not approved for use in children. Overdosage: If you think you have taken too much of this medicine contact apoison control center or emergency room at once. NOTE: This medicine is only for you. Do not share this medicine with others. What if I miss a dose? If you miss a dose, use it as soon as you can. If it is almost time for yournext dose, use only that dose. Do not use double or extra doses. What may interact with this medication? Interactions are not expected. Do not use any other vaginal products withouttelling your doctor or health care professional. This list may not describe all possible interactions. Give your health care provider a list of all the medicines, herbs, non-prescription drugs, or dietary  supplements you use. Also tell them if you smoke, drink alcohol, or use illegaldrugs. Some items may interact with your medicine. What should I watch for while using this medication? Visit your doctor or health care professional for a regular check on your progress. This medicine may cause changes on a cervical Pap smear. You willreceive regular pelvic exams. What side effects may I notice from receiving this medication? Side effects that you should report to your doctor or health care professionalas soon as possible: allergic reactions like skin rash, itching or hives Side effects that usually do not require medical attention (report to yourdoctor or health care professional if they continue or are bothersome): vaginal discharge This list may not describe all possible side effects. Call your doctor for medical advice about side effects. You may report side effects to FDA at1-800-FDA-1088. Where should I keep my medication? Keep out of the reach of children. Store at room temperature or in a refrigerator between 5 and 30 degrees C (41and 86 degrees F). Throw away any unused medicine after the expiration date. NOTE: This sheet is a summary. It may not cover all possible information. If you have questions about this medicine, talk to your doctor, pharmacist, orhealth care provider.  2022 Elsevier/Gold Standard (2015-11-05 12:30:18)

## 2020-12-04 NOTE — Progress Notes (Signed)
Pt present for follow up vulvar atrophy. Pt c/o vaginal pain, burning with urination, pain with sex and overall pain in the vaginal area.

## 2020-12-04 NOTE — Progress Notes (Signed)
    GYNECOLOGY PROGRESS NOTE  Subjective:    Patient ID: Andrea Rogers, female    DOB: [birth year], 60 y.o.   MRN: [id number]  Dysuria   Vaginal Pain Associated symptoms include dysuria.   Patient is a 60 y.o. G0P0000 female who presents for 3 month follow up of vulvar-vaginal atrophy.  She was given samples of Premarin cream, has been using as recommended, not using applicator, just using her finger for insertion. Still somewhat hesitant about using the cream as she has a family history of breast cancer in her mother (diagnosed age 22). Initial improvement was noted after several weeks, but still noting moderate symptoms of vaginal dryness with irritation, painful intercourse, and burning with urination.   The following portions of the patient's history were reviewed and updated as appropriate: allergies, current medications, past family history, past medical history, past social history, past surgical history, and problem list.  Review of Systems Pertinent items noted in HPI and remainder of comprehensive ROS otherwise negative.  Objective:   Blood pressure (!) 146/57, pulse 65, height 5' (1.524 m), weight 131 lb 12.8 oz (59.8 kg). General appearance: alert and no distress Remainder of exam deferred.   Assessment:   Vulvar-vaginal atrophy  Plan:   Discussed other options for patient for treatment.  Can consider Intrarosa due to failed management with Premarin cream, and patient's concerns regarding use of estrogen therapy due to family history.  After further discussion, patient ok to use. Will prescribe.   RTC in 2 months to reassess vulvar tissue.    Rubie Maid, MD [facility]

## 2020-12-10 DIAGNOSIS — H6123 Impacted cerumen, bilateral: Secondary | ICD-10-CM | POA: Diagnosis not present

## 2020-12-10 DIAGNOSIS — H8112 Benign paroxysmal vertigo, left ear: Secondary | ICD-10-CM | POA: Diagnosis not present

## 2020-12-10 DIAGNOSIS — L040 Acute lymphadenitis of face, head and neck: Secondary | ICD-10-CM | POA: Diagnosis not present

## 2020-12-10 DIAGNOSIS — J309 Allergic rhinitis, unspecified: Secondary | ICD-10-CM | POA: Diagnosis not present

## 2020-12-10 DIAGNOSIS — R070 Pain in throat: Secondary | ICD-10-CM | POA: Diagnosis not present

## 2020-12-10 DIAGNOSIS — R42 Dizziness and giddiness: Secondary | ICD-10-CM | POA: Diagnosis not present

## 2020-12-11 ENCOUNTER — Telehealth (INDEPENDENT_AMBULATORY_CARE_PROVIDER_SITE_OTHER): Payer: Self-pay | Admitting: Vascular Surgery

## 2020-12-11 ENCOUNTER — Other Ambulatory Visit (INDEPENDENT_AMBULATORY_CARE_PROVIDER_SITE_OTHER): Payer: Self-pay | Admitting: Nurse Practitioner

## 2020-12-11 DIAGNOSIS — Z9889 Other specified postprocedural states: Secondary | ICD-10-CM

## 2020-12-11 DIAGNOSIS — R221 Localized swelling, mass and lump, neck: Secondary | ICD-10-CM

## 2020-12-11 NOTE — Telephone Encounter (Signed)
Schedule patient to come in for carotid ultrasound see Schnier or Arna Medici

## 2020-12-12 ENCOUNTER — Ambulatory Visit (INDEPENDENT_AMBULATORY_CARE_PROVIDER_SITE_OTHER): Payer: 59 | Admitting: Nurse Practitioner

## 2020-12-12 ENCOUNTER — Ambulatory Visit (INDEPENDENT_AMBULATORY_CARE_PROVIDER_SITE_OTHER): Payer: 59

## 2020-12-12 ENCOUNTER — Other Ambulatory Visit: Payer: Self-pay

## 2020-12-12 VITALS — BP 150/75 | HR 74 | Ht 64.0 in | Wt 131.0 lb

## 2020-12-12 DIAGNOSIS — I10 Essential (primary) hypertension: Secondary | ICD-10-CM | POA: Diagnosis not present

## 2020-12-12 DIAGNOSIS — R221 Localized swelling, mass and lump, neck: Secondary | ICD-10-CM | POA: Diagnosis not present

## 2020-12-12 DIAGNOSIS — Z9889 Other specified postprocedural states: Secondary | ICD-10-CM | POA: Diagnosis not present

## 2020-12-12 DIAGNOSIS — E782 Mixed hyperlipidemia: Secondary | ICD-10-CM

## 2020-12-13 ENCOUNTER — Encounter (INDEPENDENT_AMBULATORY_CARE_PROVIDER_SITE_OTHER): Payer: Self-pay | Admitting: Nurse Practitioner

## 2020-12-16 NOTE — Progress Notes (Signed)
Subjective:    Patient ID: Andrea Rogers, female    DOB: Oct 16, 1960, 60 y.o.   MRN: [id number] Chief Complaint  Patient presents with   Follow-up    Consult and [place]    Andrea Rogers is a 60 year old female that is referred back to [place] for evaluation of swelling on her right neck.  She was previously seen by Dr. Tami Ribas who is concerned for possible infection of patch placed during carotid endarterectomy.  The symptoms initially began as a sore throat.  The patient has a area of swelling almost the diameter of a golf ball posterior to her incision from her carotid endarterectomy.  This area is tender but not significantly painful.  The patient's carotid endarterectomy was done on 08/11/2019.  She denies any fever or chills.  Her previous studies done on 03/15/2020 showed a 1 to 39% stenosis of the right ICA with a 1 to 39% stenosis of the left ICA.  There was a noted greater than 50% plaque in the common carotid artery however it was not hemodynamically significant.  Today, velocities in the right ICA are consistent with a 40 to 59% stenosis.  Left ICA is consistent with a 60 to 79% stenosis.  There are normal flow hemodynamics in the bilateral subclavian arteries.   Review of Systems  HENT:  Positive for sore throat. Negative for facial swelling and trouble swallowing.   All other systems reviewed and are negative.     Objective:   Physical Exam Vitals reviewed.  HENT:     Head: Normocephalic.  Cardiovascular:     Rate and Rhythm: Normal rate.     Pulses: Normal pulses.  Pulmonary:     Effort: Pulmonary effort is normal.  Musculoskeletal:     Cervical back: Normal range of motion. Tenderness present.  Neurological:     Mental Status: She is alert and oriented to person, place, and time.  Psychiatric:        Mood and Affect: Mood normal.        Behavior: Behavior normal.        Thought Content: Thought content normal.        Judgment: Judgment normal.    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[place])    Osteopenia    Recurrent UTI    Stroke ([place]) 02/2019   TIA   Thyroid disease    Tobacco abuse     Social History   Socioeconomic History   Marital status: Married    Spouse name: Not on file   Number of children: Not on file   Years of education: Not on file   Highest education level: Not on file  Occupational History   Not on file  Tobacco Use   Smoking status: Every Day    Packs/day: 1.00    Types: Cigarettes   Smokeless tobacco: Never  Vaping Use   Vaping Use: Never used  Substance and  Sexual Activity   Alcohol use: No   Drug use: No   Sexual activity: Not Currently    Birth control/protection: Post-menopausal  Other Topics Concern   Not on file  Social History Narrative   Not on file   Social Determinants of Health   Financial Resource Strain: Not on file  Food Insecurity: No Food Insecurity   Worried About Running Out of Food in the Last Year: Never true   Ran Out of Food in the Last Year: Never true  Transportation Needs: No Transportation Needs   Lack of Transportation (Medical): No   Lack of Transportation (Non-Medical): No  Physical Activity: Not on file  Stress: Not on file  Social Connections: Not on file  Intimate Partner Violence: Not on file    Past Surgical History:  Procedure Laterality Date   COLONOSCOPY WITH PROPOFOL N/A 04/25/2018   Procedure: COLONOSCOPY WITH PROPOFOL;  Surgeon: Lollie Sails, MD;  Location: [facility] ENDOSCOPY;  Service: Endoscopy;  Laterality: N/A;   ENDARTERECTOMY Right 08/11/2019   Procedure:  ENDARTERECTOMY CAROTID;  Surgeon: Katha Cabal, MD;  Location: [facility] ORS;  Service: Vascular;  Laterality: Right;   NO PAST SURGERIES      Family History  Adopted: Yes  Problem Relation Age of Onset   Breast cancer Mother    Colon cancer Father    Diabetes Father     Allergies  Allergen Reactions   Latex Rash   Penicillins Other (See Comments)    Yeast infection Did it involve swelling of the face/tongue/throat, SOB, or low BP? No Did it involve sudden or severe rash/hives, skin peeling, or any reaction on the inside of your mouth or nose? No Did you need to seek medical attention at a hospital or doctor's office? No When did it last happen?       If all above answers are "NO", may proceed with cephalosporin use.     CBC Latest Ref Rng & Units 08/12/2019 02/17/2019  WBC 4.0 - 10.5 K/uL 10.4 10.6(H)  Hemoglobin 12.0 - 15.0 g/dL 10.4(L) 15.5(H)  Hematocrit 36.0 - 46.0 % 33.9(L) 47.4(H)  Platelets 150 - 400 K/uL 227 309      CMP     Component Value Date/Time   NA 139 08/12/2019 0426   K 4.0 08/12/2019 0426   CL 103 08/12/2019 0426   CO2 28 08/12/2019 0426   GLUCOSE 124 (H) 08/12/2019 0426   BUN 14 08/12/2019 0426   CREATININE <0.30 (L) 08/12/2019 0426   CALCIUM 8.5 (L) 08/12/2019 0426   PROT 8.4 (H) 02/17/2019 1718   ALBUMIN 4.6 02/17/2019 1718   AST 27 02/17/2019 1718   ALT 23 02/17/2019 1718   ALKPHOS 73 02/17/2019 1718   BILITOT 0.7 02/17/2019 1718   GFRNONAA NOT CALCULATED 08/12/2019 0426   GFRAA NOT CALCULATED 08/12/2019 0426     No results found.     Assessment & Plan:   1. Palpable mass of neck Is difficult to determine from the sound if the area swelling is a possible aneurysm from her carotid or some other mass.  There is also concern is the drastic increase in velocities bilaterally in a 73-monthtimeframe.  Out of an abundance of caution we will perform a CT angiogram of the neck to ensure there is not any aneurysmal activity in her ICA as well  as to evaluate for any other sources or causes of swelling. - CT ANGIO NECK W OR WO CONTRAST; Future - Creatinine, IStat  2.  Mixed hyperlipidemia Continue statin as ordered and reviewed, no changes at this time   3. Essential hypertension Continue antihypertensive medications as already ordered, these medications have been reviewed and there are no changes at this time.    Current Outpatient Medications on File Prior to Visit  Medication Sig Dispense Refill   albuterol (VENTOLIN HFA) 108 (90 Base) MCG/ACT inhaler Inhale 2 puffs into the lungs every 6 (six) hours as needed for wheezing or shortness of breath.      ALPRAZolam (XANAX) 0.25 MG tablet Take 0.25 mg by mouth at bedtime as needed for anxiety or sleep.      ALPRAZolam (XANAX) 0.5 MG tablet Take 0.5 mg by mouth at bedtime as needed.     ALPRAZolam (XANAX) 0.5 MG tablet Take by mouth.     amoxicillin-clavulanate (AUGMENTIN) 875-125 MG tablet Take 1 tablet by mouth 2 (two) times daily.     aspirin EC 81 MG tablet Take 81 mg by mouth daily.     atenolol (TENORMIN) 100 MG tablet Take 200 mg by mouth daily.      ezetimibe (ZETIA) 10 MG tablet Take 10 mg by mouth at bedtime.      glimepiride (AMARYL) 1 MG tablet Take 1 mg by mouth daily.      HYDROcodone-acetaminophen (NORCO) 7.5-325 MG tablet Take 1 tablet by mouth every 6 (six) hours as needed for moderate pain. Takes mostly at night     levothyroxine (SYNTHROID) 75 MCG tablet Take 1 tablet by mouth daily.     levothyroxine (SYNTHROID, LEVOTHROID) 75 MCG tablet Take 75 mcg by mouth at bedtime.      meclizine (ANTIVERT) 25 MG tablet Take 25 mg by mouth 3 (three) times daily as needed.     metFORMIN (GLUCOPHAGE) 500 MG tablet Take 1,000 mg by mouth 2 (two) times daily with a meal.      metFORMIN (GLUCOPHAGE) 500 MG tablet Take 2 tablets by mouth 2 (two) times daily.     omeprazole (PRILOSEC) 40 MG capsule Take 40 mg by mouth daily.     omeprazole (PRILOSEC) 40 MG capsule Take 1 tablet  by mouth daily.     Prasterone (INTRAROSA) 6.5 MG INST Place 1 tablet vaginally at bedtime. 30 each 11   triamterene-hydrochlorothiazide (MAXZIDE) 75-50 MG tablet Take 1 tablet by mouth daily.      ferrous sulfate 325 (65 FE) MG tablet Take by mouth.     No current facility-administered medications on file prior to visit.    There are no Patient Instructions on file for this visit. No follow-ups on file.   Kris Hartmann, NP

## 2020-12-18 ENCOUNTER — Encounter (INDEPENDENT_AMBULATORY_CARE_PROVIDER_SITE_OTHER): Payer: Self-pay

## 2020-12-18 NOTE — Telephone Encounter (Signed)
I spoke with the patient and she will be schedule for 8/4 '@2'$ :45 with Schnier to go over CT results

## 2020-12-19 NOTE — Telephone Encounter (Signed)
I am pretty sure that I did mention this, but I resubmitted the PA. Waiting on response.

## 2020-12-20 ENCOUNTER — Ambulatory Visit
Admission: RE | Admit: 2020-12-20 | Discharge: 2020-12-20 | Disposition: A | Discharge status: Home / Self Care | Payer: 59 | Source: Ambulatory Visit | Attending: Nurse Practitioner | Admitting: Nurse Practitioner

## 2020-12-20 ENCOUNTER — Other Ambulatory Visit: Payer: Self-pay

## 2020-12-20 DIAGNOSIS — I6523 Occlusion and stenosis of bilateral carotid arteries: Secondary | ICD-10-CM | POA: Diagnosis not present

## 2020-12-20 DIAGNOSIS — R221 Localized swelling, mass and lump, neck: Secondary | ICD-10-CM | POA: Diagnosis not present

## 2020-12-20 DIAGNOSIS — H9201 Otalgia, right ear: Secondary | ICD-10-CM | POA: Diagnosis not present

## 2020-12-20 LAB — POCT I-STAT CREATININE: Creatinine, Ser: 0.5 mg/dL (ref 0.44–1.00)

## 2020-12-20 MED ORDER — IOHEXOL 350 MG/ML SOLN
75.0000 mL | Freq: Once | INTRAVENOUS | Status: AC | PRN
  Administered 2020-12-20: 75 mL via INTRAVENOUS

## 2020-12-26 ENCOUNTER — Other Ambulatory Visit: Payer: Self-pay

## 2020-12-26 ENCOUNTER — Encounter (INDEPENDENT_AMBULATORY_CARE_PROVIDER_SITE_OTHER): Payer: Self-pay | Admitting: Vascular Surgery

## 2020-12-26 ENCOUNTER — Ambulatory Visit (INDEPENDENT_AMBULATORY_CARE_PROVIDER_SITE_OTHER): Payer: 59 | Admitting: Vascular Surgery

## 2020-12-26 VITALS — BP 172/84 | HR 72 | Resp 16

## 2020-12-26 DIAGNOSIS — R221 Localized swelling, mass and lump, neck: Secondary | ICD-10-CM

## 2020-12-26 DIAGNOSIS — E1159 Type 2 diabetes mellitus with other circulatory complications: Secondary | ICD-10-CM | POA: Diagnosis not present

## 2020-12-26 DIAGNOSIS — E782 Mixed hyperlipidemia: Secondary | ICD-10-CM

## 2020-12-26 DIAGNOSIS — I6523 Occlusion and stenosis of bilateral carotid arteries: Secondary | ICD-10-CM | POA: Diagnosis not present

## 2020-12-26 DIAGNOSIS — I10 Essential (primary) hypertension: Secondary | ICD-10-CM

## 2020-12-26 MED ORDER — AMOXICILLIN-POT CLAVULANATE 875-125 MG PO TABS: 1.0000 | ORAL_TABLET | Freq: Two times a day (BID) | ORAL | 0 refills | Status: DC

## 2020-12-26 MED ORDER — FLUCONAZOLE 50 MG PO TABS: 50.0000 mg | ORAL_TABLET | Freq: Every day | ORAL | 0 refills | Status: DC

## 2020-12-26 NOTE — Progress Notes (Signed)
MRN : [id number]  Andrea Rogers is a 60 y.o. (Nov 19, 1960) female who presents with chief complaint of  Chief Complaint  Patient presents with   Follow-up    Ct results  .  History of Present Illness:   The patient is seen for follow up evaluation of carotid stenosis status post CT angiogram. CT scan was done December 20, 2020. Patient reports that the test went well with no problems or complications.  Today she states her sore throat has resolved and that the lump in her neck is much much smaller.  It remains nontender.  She is now about done with 10 days of Augmentin that was prescribed by Dr. Tami Ribas.  She denies fever chills.  She denies neck pain.  The patient denies interval amaurosis fugax. There is no recent or interval TIA symptoms or focal motor deficits. There is no prior documented CVA.  The patient's carotid endarterectomy was done on 08/11/2019.  She was seen by Dr. Tami Ribas earlier in July for an area of swelling in the right lateral neck associated with a sore throat.  The palpable area was noted to be 2 to 3 cm in diameter and sitting just posterior to the right carotid endarterectomy incisional scar.  CT angiogram is reviewed by me personally and shows the right carotid endarterectomy is widely patent.  There is no abnormality no pseudoaneurysm or other enlargement of the artery itself.  (The CT report does note a fluid collection at the level of the carotid bifurcation but I am having difficulty appreciating this lesion).  There certainly does not appear to be any large mass filled with fluid and air that would suggest an abscess.  On the left the stenosis consistent with calcified plaque at the origin of the 50% internal carotid artery.    Current Meds  Medication Sig   albuterol (VENTOLIN HFA) 108 (90 Base) MCG/ACT inhaler Inhale 2 puffs into the lungs every 6 (six) hours as needed for wheezing or shortness of breath.    ALPRAZolam (XANAX) 0.25 MG tablet Take 0.25 mg by mouth  at bedtime as needed for anxiety or sleep.    ALPRAZolam (XANAX) 0.5 MG tablet Take 0.5 mg by mouth at bedtime as needed.   ALPRAZolam (XANAX) 0.5 MG tablet Take by mouth.   aspirin EC 81 MG tablet Take 81 mg by mouth daily.   atenolol (TENORMIN) 100 MG tablet Take 200 mg by mouth daily.    ezetimibe (ZETIA) 10 MG tablet Take 10 mg by mouth at bedtime.    glimepiride (AMARYL) 1 MG tablet Take 1 mg by mouth daily.    HYDROcodone-acetaminophen (NORCO) 7.5-325 MG tablet Take 1 tablet by mouth every 6 (six) hours as needed for moderate pain. Takes mostly at night   levothyroxine (SYNTHROID) 75 MCG tablet Take 1 tablet by mouth daily.   levothyroxine (SYNTHROID, LEVOTHROID) 75 MCG tablet Take 75 mcg by mouth at bedtime.    meclizine (ANTIVERT) 25 MG tablet Take 25 mg by mouth 3 (three) times daily as needed.   metFORMIN (GLUCOPHAGE) 500 MG tablet Take 1,000 mg by mouth 2 (two) times daily with a meal.    metFORMIN (GLUCOPHAGE) 500 MG tablet Take 2 tablets by mouth 2 (two) times daily.   omeprazole (PRILOSEC) 40 MG capsule Take 40 mg by mouth daily.   omeprazole (PRILOSEC) 40 MG capsule Take 1 tablet by mouth daily.   Prasterone (INTRAROSA) 6.5 MG INST Place 1 tablet vaginally at bedtime.   triamterene-hydrochlorothiazide (  MAXZIDE) 75-50 MG tablet Take 1 tablet by mouth daily.     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[place])    Osteopenia    Recurrent UTI    Stroke ([place]) 02/2019   TIA   Thyroid disease    Tobacco abuse     Past Surgical History:  Procedure Laterality Date   COLONOSCOPY WITH PROPOFOL N/A 04/25/2018   Procedure: COLONOSCOPY WITH PROPOFOL;  Surgeon: Lollie Sails, MD;  Location:  [facility] ENDOSCOPY;  Service: Endoscopy;  Laterality: N/A;   ENDARTERECTOMY Right 08/11/2019   Procedure: ENDARTERECTOMY CAROTID;  Surgeon: Katha Cabal, MD;  Location: [facility] ORS;  Service: Vascular;  Laterality: Right;   NO PAST SURGERIES      Social History Social History   Tobacco Use   Smoking status: Every Day    Packs/day: 1.00    Types: Cigarettes   Smokeless tobacco: Never  Vaping Use   Vaping Use: Never used  Substance Use Topics   Alcohol use: No   Drug use: No    Family History Family History  Adopted: Yes  Problem Relation Age of Onset   Breast cancer Mother    Colon cancer Father    Diabetes Father     Allergies  Allergen Reactions   Latex Rash   Penicillins Other (See Comments)    Yeast infection Did it involve swelling of the face/tongue/throat, SOB, or low BP? No Did it involve sudden or severe rash/hives, skin peeling, or any reaction on the inside of your mouth or nose? No Did you need to seek medical attention at a hospital or doctor's office? No When did it last happen?       If all above answers are "NO", may proceed with cephalosporin use.      REVIEW OF SYSTEMS (Negative unless checked)  Constitutional: '[]'$ Weight loss  '[]'$ Fever  '[]'$ Chills Cardiac: '[]'$ Chest pain   '[]'$ Chest pressure   '[]'$ Palpitations   '[]'$ Shortness of breath when laying flat   '[]'$ Shortness of breath with exertion. Vascular:  '[]'$ Pain in legs with walking   '[]'$ Pain in legs at rest  '[]'$ History of DVT   '[]'$ Phlebitis   '[]'$ Swelling in legs   '[]'$ Varicose veins   '[]'$ Non-healing ulcers Pulmonary:   '[]'$ Uses home oxygen   '[]'$ Productive cough   '[]'$ Hemoptysis   '[]'$ Wheeze  '[]'$ COPD   '[]'$ Asthma Neurologic:  '[]'$ Dizziness   '[]'$ Seizures   '[]'$ History of stroke   '[]'$ History of TIA  '[]'$ Aphasia   '[]'$ Vissual changes   '[]'$ Weakness or numbness in arm   '[x]'$ Weakness or numbness in leg Musculoskeletal:   '[]'$ Joint swelling   '[]'$ Joint pain   '[]'$ Low back pain Hematologic:  '[]'$ Easy bruising  '[]'$ Easy bleeding   '[]'$ Hypercoagulable state    '[]'$ Anemic Gastrointestinal:  '[]'$ Diarrhea   '[]'$ Vomiting  '[]'$ Gastroesophageal reflux/heartburn   '[]'$ Difficulty swallowing. Genitourinary:  '[]'$ Chronic kidney disease   '[]'$ Difficult urination  '[]'$ Frequent urination   '[]'$ Blood in urine Skin:  '[]'$ Rashes   '[]'$ Ulcers  Psychological:  '[]'$ History of anxiety   '[]'$  History of major depression.  Physical Examination  Vitals:   12/26/20 1445  BP: (!) 172/84  Pulse: 72  Resp: 16   There is no  height or weight on file to calculate BMI. Gen: WD/WN, NAD Head: [place]/AT, No temporalis wasting.  Ear/Nose/Throat: Hearing grossly intact, nares w/o erythema or drainage Eyes: PER, EOMI, sclera nonicteric.  Neck: Supple, no large masses.   Pulmonary:  Good air movement, no audible wheezing bilaterally, no use of accessory muscles.  Cardiac: RRR, no JVD Vascular: Palpation of the right neck demonstrates a small area, smaller than an almond, that is nontender nonfluctuant.  Bilateral carotid bruits are noted. Vessel Right Left  Radial Palpable Palpable  Carotid Palpable Palpable  Gastrointestinal: Non-distended. No guarding/no peritoneal signs.  Musculoskeletal: M/S 5/5 throughout.  No deformity or atrophy.  Neurologic: CN 2-12 intact. Symmetrical.  Speech is fluent. Motor exam as listed above. Psychiatric: Judgment intact, Mood & affect appropriate for pt's clinical situation. Dermatologic: No rashes or ulcers noted.  No changes consistent with cellulitis.   CBC Lab Results  Component Value Date   WBC 10.4 08/12/2019   HGB 10.4 (L) 08/12/2019   HCT 33.9 (L) 08/12/2019   MCV 96.6 08/12/2019   PLT 227 08/12/2019    BMET    Component Value Date/Time   NA 139 08/12/2019 0426   K 4.0 08/12/2019 0426   CL 103 08/12/2019 0426   CO2 28 08/12/2019 0426   GLUCOSE 124 (H) 08/12/2019 0426   BUN 14 08/12/2019 0426   CREATININE 0.50 12/20/2020 1439   CALCIUM 8.5 (L) 08/12/2019 0426   GFRNONAA NOT CALCULATED 08/12/2019 0426   GFRAA NOT CALCULATED 08/12/2019 0426    CrCl cannot be calculated (Unknown ideal weight.).  COAG Lab Results  Component Value Date   INR 0.9 08/09/2019    Radiology CT ANGIO NECK W OR WO CONTRAST  Result Date: 12/21/2020 CLINICAL DATA:  Arterial complication, postprocedure. Right neck not. Right ear pain for 2 weeks. It abnormal carotid arterial duplex study 12/12/2020. Left carotid artery stenosis. EXAM: CT ANGIOGRAPHY NECK TECHNIQUE: Multidetector CT imaging of the neck was performed using the standard protocol during bolus administration of intravenous contrast. Multiplanar CT image reconstructions and MIPs were obtained to evaluate the vascular anatomy. Carotid stenosis measurements (when applicable) are obtained utilizing NASCET criteria, using the distal internal carotid diameter as the denominator. CONTRAST:  24m OMNIPAQUE IOHEXOL 350 MG/ML SOLN COMPARISON:  CT a of the head and neck 04/09/2019 FINDINGS: Aortic arch: Common origin of the left common carotid artery and innominate artery is noted. Atherosclerotic changes and mural irregularity present throughout the aortic arch without focal stenosis or aneurysm. Right carotid system: Wall irregularity is noted in the right common carotid artery without focal stenosis. Atherosclerotic irregularity present at the right carotid bifurcation. Right carotid endarterectomy noted. No significant stenosis is present. Left carotid system: Wall irregularity present in the left common carotid artery. Dense atherosclerotic calcifications are present at the carotid bifurcation. Minimal lumen in diameter is 2.5 mm. These demonstrates slight progression prior exam. The distal left ICA measures 4.5 mm. Vertebral arteries: The vertebral arteries are codominant. Both vertebral arteries originate from the subclavian arteries without significant stenosis. There is no significant stenosis of either vertebral artery in the neck. Intracranial portions of the vertebral arteries noted through through the  vertebrobasilar junction without significant stenosis. Visualized basilar artery is normal. Skeleton: Vertebral body heights and alignment are normal. No focal osseous lesions are present. Other neck: A peripherally enhancing low-density collection is present anterior to the right carotid endarterectomy, just below the bifurcation. The collection measures 21 x 10 x 9 mm. No significant adenopathy is present. Thyroid is  mildly heterogeneous without a discrete lesion. The submandibular and parotid glands and ducts are within normal limits. Upper chest: The lung apices are clear. Thoracic inlet is within normal limits. IMPRESSION: 1. 21 x 10 x 9 mm peripherally enhancing low-density collection anterior to the right carotid endarterectomy. Differential diagnosis includes chronic seroma or abscess. 2. Right carotid endarterectomy without significant stenosis. 3. Aortic Atherosclerosis (ICD10-I70.0). Electronically Signed   By: San Morelle M.D.   On: 12/21/2020 19:03   VAS US CAROTID  Result Date: 12/12/2020 Carotid Arterial Duplex Study Patient Name:  [facility] Melony Overly  Date of Exam:   12/12/2020 Medical Rec #: [id number]   Accession #:    [id number] Date of Birth: 1961-01-04   Patient Gender: F Patient Age:   60Y Exam Location:  [facility] Procedure:      VAS US CAROTID Referring Phys: [id number] Kris Hartmann --------------------------------------------------------------------------------  Indications:       Carotid artery disease. Other Factors:     Right CEA 08/11/2019. Comparison Study:  03/15/2020; Incomparison to prior study the ICA have                    increased on both sides: 40-59% in the Right and 60-79% in                    the Left. Performing Technologist: Almira Coaster RVS  Examination Guidelines: A complete evaluation includes B-mode imaging, spectral Doppler, color Doppler, and power Doppler as needed of all accessible portions of each vessel. Bilateral testing is considered an  integral part of a complete examination. Limited examinations for reoccurring indications may be performed as noted.  Right Carotid Findings: +----------+--------+--------+--------+------------------+--------+           PSV cm/sEDV cm/sStenosisPlaque DescriptionComments +----------+--------+--------+--------+------------------+--------+ CCA Prox  118     12                                         +----------+--------+--------+--------+------------------+--------+ CCA Mid   101     16                                         +----------+--------+--------+--------+------------------+--------+ CCA Distal146     22                                         +----------+--------+--------+--------+------------------+--------+ ICA Prox  124     22                                         +----------+--------+--------+--------+------------------+--------+ ICA Mid   91      21                                         +----------+--------+--------+--------+------------------+--------+ ICA Distal94      38                                         +----------+--------+--------+--------+------------------+--------+  ECA       237     22                                         +----------+--------+--------+--------+------------------+--------+ +----------+--------+-------+--------+-------------------+           PSV cm/sEDV cmsDescribeArm Pressure (mmHG) +----------+--------+-------+--------+-------------------+ Subclavian105     0                                  +----------+--------+-------+--------+-------------------+ +---------+--------+--+--------+--+ VertebralPSV cm/s60EDV cm/s10 +---------+--------+--+--------+--+  Left Carotid Findings: +----------+--------+--------+--------+------------------+--------+           PSV cm/sEDV cm/sStenosisPlaque DescriptionComments +----------+--------+--------+--------+------------------+--------+ CCA Prox  90       17                                         +----------+--------+--------+--------+------------------+--------+ CCA Mid   95      23                                         +----------+--------+--------+--------+------------------+--------+ CCA Distal83      23                                         +----------+--------+--------+--------+------------------+--------+ ICA Prox  251     56                                         +----------+--------+--------+--------+------------------+--------+ ICA Mid   146     29                                         +----------+--------+--------+--------+------------------+--------+ ICA Distal115     27                                         +----------+--------+--------+--------+------------------+--------+ ECA       260     24                                         +----------+--------+--------+--------+------------------+--------+ +----------+--------+--------+--------+-------------------+           PSV cm/sEDV cm/sDescribeArm Pressure (mmHG) +----------+--------+--------+--------+-------------------+ [id number]     0                                   +----------+--------+--------+--------+-------------------+ +---------+--------+--+--------+--+ VertebralPSV cm/s76EDV cm/s18 +---------+--------+--+--------+--+   Summary: Right Carotid: Velocities in the right ICA are consistent with a 40-59%                stenosis. The ECA appears >50% stenosed. Left Carotid: Velocities in the left ICA are consistent  with a 60-79% stenosis.               The ECA appears >50% stenosed. Vertebrals:  Bilateral vertebral arteries demonstrate antegrade flow. Subclavians: Normal flow hemodynamics were seen in bilateral subclavian              arteries. *See table(s) above for measurements and observations.  Electronically signed by Hortencia Pilar MD on 12/12/2020 at 5:06:04 PM.    Final      Assessment/Plan 1. Neck mass Her  neck mass seems to be improving and her symptoms are better.  Review of the CT does not show any major findings consistent with a pseudoaneurysm or other problem with the carotid repair.  At this point I would vote to continue her antibiotics for 2 more weeks and then repeat her CT scan to see if this area has completely resolved. - CT ANGIO NECK W OR WO CONTRAST; Future  2. Carotid artery calcification, bilateral Recommend:  Given the patient's asymptomatic subcritical stenosis no further invasive testing or surgery at this time.  Duplex ultrasound shows widely patent right carotid endarterectomy repair and 50% left internal carotid artery stenosis.  Continue antiplatelet therapy as prescribed Continue management of CAD, HTN and Hyperlipidemia Healthy heart diet,  encouraged exercise at least 4 times per week Follow up in 12 months with duplex ultrasound and physical exam    3. Essential hypertension Continue antihypertensive medications as already ordered, these medications have been reviewed and there are no changes at this time.   4. Controlled type 2 diabetes mellitus with other circulatory complication, without long-term current use of insulin (HCC) Continue hypoglycemic medications as already ordered, these medications have been reviewed and there are no changes at this time.  Hgb A1C to be monitored as already arranged by primary service   5. Mixed hyperlipidemia Continue statin as ordered and reviewed, no changes at this time     Hortencia Pilar, MD  12/26/2020 4:12 PM

## 2020-12-26 NOTE — Telephone Encounter (Signed)
Ok. Please place the form on my desk. I will likely have to call to speak with someone in person to plead her case.

## 2020-12-27 DIAGNOSIS — E538 Deficiency of other specified B group vitamins: Secondary | ICD-10-CM | POA: Diagnosis not present

## 2020-12-27 DIAGNOSIS — I10 Essential (primary) hypertension: Secondary | ICD-10-CM | POA: Diagnosis not present

## 2020-12-27 DIAGNOSIS — Z720 Tobacco use: Secondary | ICD-10-CM | POA: Diagnosis not present

## 2020-12-27 DIAGNOSIS — R69 Illness, unspecified: Secondary | ICD-10-CM | POA: Diagnosis not present

## 2020-12-27 DIAGNOSIS — E1165 Type 2 diabetes mellitus with hyperglycemia: Secondary | ICD-10-CM | POA: Diagnosis not present

## 2020-12-27 DIAGNOSIS — I6523 Occlusion and stenosis of bilateral carotid arteries: Secondary | ICD-10-CM | POA: Diagnosis not present

## 2020-12-27 DIAGNOSIS — G7109 Other specified muscular dystrophies: Secondary | ICD-10-CM | POA: Diagnosis not present

## 2020-12-27 DIAGNOSIS — R8290 Unspecified abnormal findings in urine: Secondary | ICD-10-CM | POA: Diagnosis not present

## 2020-12-27 DIAGNOSIS — Z789 Other specified health status: Secondary | ICD-10-CM | POA: Diagnosis not present

## 2021-01-03 DIAGNOSIS — Z789 Other specified health status: Secondary | ICD-10-CM | POA: Diagnosis not present

## 2021-01-03 DIAGNOSIS — E538 Deficiency of other specified B group vitamins: Secondary | ICD-10-CM | POA: Diagnosis not present

## 2021-01-03 DIAGNOSIS — I10 Essential (primary) hypertension: Secondary | ICD-10-CM | POA: Diagnosis not present

## 2021-01-03 DIAGNOSIS — Z720 Tobacco use: Secondary | ICD-10-CM | POA: Diagnosis not present

## 2021-01-03 DIAGNOSIS — E1165 Type 2 diabetes mellitus with hyperglycemia: Secondary | ICD-10-CM | POA: Diagnosis not present

## 2021-01-24 DIAGNOSIS — N390 Urinary tract infection, site not specified: Secondary | ICD-10-CM | POA: Diagnosis not present

## 2021-01-24 DIAGNOSIS — R399 Unspecified symptoms and signs involving the genitourinary system: Secondary | ICD-10-CM | POA: Diagnosis not present

## 2021-02-04 ENCOUNTER — Encounter: Payer: 59 | Admitting: Obstetrics and Gynecology

## 2021-02-05 ENCOUNTER — Ambulatory Visit
Admission: RE | Admit: 2021-02-05 | Discharge: 2021-02-05 | Disposition: A | Discharge status: Home / Self Care | Payer: 59 | Source: Ambulatory Visit | Attending: Vascular Surgery | Admitting: Vascular Surgery

## 2021-02-05 ENCOUNTER — Other Ambulatory Visit: Payer: Self-pay

## 2021-02-05 DIAGNOSIS — L0211 Cutaneous abscess of neck: Secondary | ICD-10-CM | POA: Diagnosis not present

## 2021-02-05 DIAGNOSIS — R221 Localized swelling, mass and lump, neck: Secondary | ICD-10-CM | POA: Insufficient documentation

## 2021-02-05 DIAGNOSIS — I6522 Occlusion and stenosis of left carotid artery: Secondary | ICD-10-CM | POA: Diagnosis not present

## 2021-02-05 LAB — POCT I-STAT CREATININE: Creatinine, Ser: 0.6 mg/dL (ref 0.44–1.00)

## 2021-02-05 MED ORDER — IOHEXOL 350 MG/ML SOLN
75.0000 mL | Freq: Once | INTRAVENOUS | Status: AC | PRN
  Administered 2021-02-05: 75 mL via INTRAVENOUS

## 2021-02-06 ENCOUNTER — Ambulatory Visit (INDEPENDENT_AMBULATORY_CARE_PROVIDER_SITE_OTHER): Payer: 59 | Admitting: Vascular Surgery

## 2021-02-06 VITALS — BP 152/73 | HR 68 | Ht 64.0 in | Wt 128.0 lb

## 2021-02-06 DIAGNOSIS — I6521 Occlusion and stenosis of right carotid artery: Secondary | ICD-10-CM

## 2021-02-06 DIAGNOSIS — I10 Essential (primary) hypertension: Secondary | ICD-10-CM | POA: Diagnosis not present

## 2021-02-06 DIAGNOSIS — E782 Mixed hyperlipidemia: Secondary | ICD-10-CM | POA: Diagnosis not present

## 2021-02-06 DIAGNOSIS — E1159 Type 2 diabetes mellitus with other circulatory complications: Secondary | ICD-10-CM | POA: Diagnosis not present

## 2021-02-06 NOTE — Progress Notes (Signed)
MRN : [id number]  Andrea Rogers is a 60 y.o. (06-Jul-1960) female who presents with chief complaint of check carotid.  History of Present Illness:   The patient is seen for follow up evaluation of carotid stenosis status post CT angiogram. CT scan was done December 20, 2020. Patient reports that the test went well with no problems or complications.  Today she states her sore throat has resolved and that the lump in her neck is much much smaller.  It remains nontender.  She is now about done with 10 days of Augmentin that was prescribed by Dr. Tami Ribas.  She denies fever chills.  She denies neck pain.   The patient denies interval amaurosis fugax. There is no recent or interval TIA symptoms or focal motor deficits. There is no prior documented CVA.   The patient's carotid endarterectomy was done on 08/11/2019.   She was seen by Dr. Tami Ribas earlier in July for an area of swelling in the right lateral neck associated with a sore throat.  The palpable area was noted to be 2 to 3 cm in diameter and sitting just posterior to the right carotid endarterectomy incisional scar.   CT angiogram is reviewed by me personally and shows the right carotid endarterectomy is widely patent.  There is no abnormality no pseudoaneurysm or other enlargement of the artery itself.  (This CT does again note a fluid collection at the level of the carotid bifurcation which is now smaller than the previous study).  There certainly does not appear to be an enlarge mass filled with fluid and air that would suggest an abscess.  On the left the stenosis consistent with calcified plaque at the origin of the 50% internal carotid artery.  No outpatient medications have been marked as taking for the 02/06/21 encounter (Appointment) with Delana Meyer, Dolores Lory, MD.    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[place])    Osteopenia    Recurrent UTI    Stroke ([place]) 02/2019   TIA   Thyroid disease    Tobacco abuse     Past Surgical History:  Procedure Laterality Date   COLONOSCOPY WITH PROPOFOL N/A 04/25/2018   Procedure: COLONOSCOPY WITH PROPOFOL;  Surgeon: Lollie Sails, MD;  Location: [facility] ENDOSCOPY;  Service: Endoscopy;  Laterality: N/A;   ENDARTERECTOMY Right 08/11/2019   Procedure: ENDARTERECTOMY CAROTID;  Surgeon: Katha Cabal, MD;  Location: [facility] ORS;  Service: Vascular;  Laterality: Right;   NO PAST SURGERIES      Social History Social History   Tobacco Use   Smoking status: Every Day    Packs/day: 1.00    Types: Cigarettes   Smokeless tobacco: Never  Vaping Use   Vaping Use: Never used  Substance Use Topics   Alcohol use: No   Drug use: No    Family History Family History  Adopted: Yes  Problem Relation Age of Onset   Breast cancer Mother    Colon cancer Father    Diabetes Father     Allergies  Allergen Reactions   Latex Rash   Penicillins Other (See Comments)    Yeast infection  Did it involve swelling of the face/tongue/throat, SOB, or low BP? No Did it involve sudden or severe rash/hives, skin peeling, or any reaction on the inside of your mouth or nose? No Did you need to seek medical attention at a hospital or doctor's office? No When did it last happen?       If all above answers are "NO", may proceed with cephalosporin use.      REVIEW OF SYSTEMS (Negative unless checked)  Constitutional: '[]'$ Weight loss  '[]'$ Fever  '[]'$ Chills Cardiac: '[]'$ Chest pain   '[]'$ Chest pressure   '[]'$ Palpitations   '[]'$ Shortness of breath when laying flat   '[]'$ Shortness of breath with exertion. Vascular:  '[]'$ Pain in legs with walking   '[]'$ Pain in legs at rest  '[]'$ History of DVT   '[]'$ Phlebitis   '[]'$ Swelling in legs   '[]'$ Varicose veins    '[]'$ Non-healing ulcers Pulmonary:   '[]'$ Uses home oxygen   '[]'$ Productive cough   '[]'$ Hemoptysis   '[]'$ Wheeze  '[]'$ COPD   '[]'$ Asthma Neurologic:  '[]'$ Dizziness   '[]'$ Seizures   '[]'$ History of stroke   '[]'$ History of TIA  '[]'$ Aphasia   '[]'$ Vissual changes   '[]'$ Weakness or numbness in arm   '[]'$ Weakness or numbness in leg Musculoskeletal:   '[]'$ Joint swelling   '[]'$ Joint pain   '[]'$ Low back pain Hematologic:  '[]'$ Easy bruising  '[]'$ Easy bleeding   '[]'$ Hypercoagulable state   '[]'$ Anemic Gastrointestinal:  '[]'$ Diarrhea   '[]'$ Vomiting  '[]'$ Gastroesophageal reflux/heartburn   '[]'$ Difficulty swallowing. Genitourinary:  '[]'$ Chronic kidney disease   '[]'$ Difficult urination  '[]'$ Frequent urination   '[]'$ Blood in urine Skin:  '[]'$ Rashes   '[]'$ Ulcers  Psychological:  '[]'$ History of anxiety   '[]'$  History of major depression.  Physical Examination  There were no vitals filed for this visit. There is no height or weight on file to calculate BMI. Gen: WD/WN, NAD Head: [place]/AT, No temporalis wasting.  Ear/Nose/Throat: Hearing grossly intact, nares w/o erythema or drainage Eyes: PER, EOMI, sclera nonicteric.  Neck: Supple, no masses.  No bruit or JVD.  Pulmonary:  Good air movement, no audible wheezing, no use of accessory muscles.  Cardiac: RRR, normal S1, S2, no Murmurs. Vascular:   right neck no palpable mass nontender, skin CD&I Vessel Right Left  Radial Palpable Palpable  Carotid Palpable Palpable  Gastrointestinal: soft, non-distended. No guarding/no peritoneal signs.  Musculoskeletal: M/S 5/5 throughout.  No visible deformity.  Neurologic: CN 2-12 intact. Pain and light touch intact in extremities.  Symmetrical.  Speech is fluent. Motor exam as listed above. Psychiatric: Judgment intact, Mood & affect appropriate for pt's clinical situation. Dermatologic: No rashes or ulcers noted.  No changes consistent with cellulitis.   CBC Lab Results  Component Value Date   WBC 10.4 08/12/2019   HGB 10.4 (L) 08/12/2019   HCT 33.9 (L) 08/12/2019   MCV 96.6 08/12/2019    PLT 227 08/12/2019    BMET    Component Value Date/Time   NA 139 08/12/2019 0426   K 4.0 08/12/2019 0426   CL 103 08/12/2019 0426   CO2 28 08/12/2019 0426   GLUCOSE 124 (H) 08/12/2019 0426   BUN 14 08/12/2019 0426   CREATININE 0.60 02/05/2021 1318   CALCIUM 8.5 (L) 08/12/2019 0426   GFRNONAA NOT CALCULATED 08/12/2019 0426   GFRAA NOT CALCULATED 08/12/2019 0426   CrCl cannot be calculated (Unknown ideal weight.).  COAG Lab Results  Component Value Date   INR 0.9 08/09/2019    Radiology CT ANGIO NECK W OR WO CONTRAST  Result Date: 02/05/2021 CLINICAL DATA:  Carotid artery stenosis right  neck mass possible abscess follow up s/p antibiotic therapy EXAM: CT ANGIOGRAPHY NECK TECHNIQUE: Multidetector CT imaging of the neck was performed using the standard protocol during bolus administration of intravenous contrast. Multiplanar CT image reconstructions and MIPs were obtained to evaluate the vascular anatomy. Carotid stenosis measurements (when applicable) are obtained utilizing NASCET criteria, using the distal internal carotid diameter as the denominator. CONTRAST:  29m OMNIPAQUE IOHEXOL 350 MG/ML SOLN COMPARISON:  12/20/2020 FINDINGS: Aortic arch: Standard branching. Imaged portion shows no evidence of aneurysm or dissection. No significant stenosis of the major arch vessel origins. Calcific aortic atherosclerosis. Right carotid system: Status post endarterectomy. No evidence of dissection, stenosis (50% or greater) or occlusion. Left carotid system: Atherosclerotic calcification at the carotid bifurcation causing 50% stenosis of the proximal internal carotid artery. Vertebral arteries: Codominant. No evidence of dissection, stenosis (50% or greater) or occlusion. Skeleton: No acute abnormality Other neck: Between the right submandibular gland and right carotid bifurcation is a centrally hypodense area measuring 18 x 8 x 9 mm. The area has decreased in size since 12/20/2020. Upper chest:  Lung apices are clear IMPRESSION: 1. Decreased size of right neck abscess, now measuring 18 x 8 x 9 mm. 2. 50% stenosis of the proximal left internal carotid artery due to atherosclerotic calcification. Aortic Atherosclerosis (ICD10-I70.0). Electronically Signed   By: KUlyses JarredM.D.   On: 02/05/2021 20:02     Assessment/Plan 1. Carotid stenosis, symptomatic w/o infarct, right Recommend:  Given the patient's asymptomatic subcritical stenosis no further invasive testing or surgery at this time.  Duplex ultrasound shows <30% stenosis bilaterally.  Continue antiplatelet therapy as prescribed Continue management of CAD, HTN and Hyperlipidemia Healthy heart diet,  encouraged exercise at least 4 times per week Follow up in 6 months with duplex ultrasound and physical exam   - VAS [place]; Future  2. Essential hypertension Continue antihypertensive medications as already ordered, these medications have been reviewed and there are no changes at this time.   3. Controlled type 2 diabetes mellitus with other circulatory complication, without long-term current use of insulin (HCC) Continue hypoglycemic medications as already ordered, these medications have been reviewed and there are no changes at this time.  Hgb A1C to be monitored as already arranged by primary service   4. Mixed hyperlipidemia Continue statin as ordered and reviewed, no changes at this time    GHortencia Pilar MD  02/06/2021 12:50 PM

## 2021-02-09 ENCOUNTER — Encounter (INDEPENDENT_AMBULATORY_CARE_PROVIDER_SITE_OTHER): Payer: Self-pay | Admitting: Vascular Surgery

## 2021-02-12 DIAGNOSIS — R790 Abnormal level of blood mineral: Secondary | ICD-10-CM | POA: Diagnosis not present

## 2021-02-12 DIAGNOSIS — G2581 Restless legs syndrome: Secondary | ICD-10-CM | POA: Diagnosis not present

## 2021-02-12 DIAGNOSIS — G7109 Other specified muscular dystrophies: Secondary | ICD-10-CM | POA: Diagnosis not present

## 2021-02-26 ENCOUNTER — Encounter: Payer: 59 | Admitting: Obstetrics and Gynecology

## 2021-03-13 ENCOUNTER — Encounter (INDEPENDENT_AMBULATORY_CARE_PROVIDER_SITE_OTHER): Payer: Medicare Other

## 2021-03-13 ENCOUNTER — Ambulatory Visit (INDEPENDENT_AMBULATORY_CARE_PROVIDER_SITE_OTHER): Payer: Medicare Other | Admitting: Vascular Surgery

## 2021-03-13 DIAGNOSIS — Z23 Encounter for immunization: Secondary | ICD-10-CM | POA: Diagnosis not present

## 2021-03-13 DIAGNOSIS — I10 Essential (primary) hypertension: Secondary | ICD-10-CM | POA: Diagnosis not present

## 2021-03-13 DIAGNOSIS — I498 Other specified cardiac arrhythmias: Secondary | ICD-10-CM | POA: Diagnosis not present

## 2021-03-13 DIAGNOSIS — R002 Palpitations: Secondary | ICD-10-CM | POA: Diagnosis not present

## 2021-03-19 DIAGNOSIS — I10 Essential (primary) hypertension: Secondary | ICD-10-CM | POA: Diagnosis not present

## 2021-03-19 DIAGNOSIS — Z720 Tobacco use: Secondary | ICD-10-CM | POA: Diagnosis not present

## 2021-03-19 DIAGNOSIS — E538 Deficiency of other specified B group vitamins: Secondary | ICD-10-CM | POA: Diagnosis not present

## 2021-03-19 DIAGNOSIS — Z789 Other specified health status: Secondary | ICD-10-CM | POA: Diagnosis not present

## 2021-03-19 DIAGNOSIS — E1165 Type 2 diabetes mellitus with hyperglycemia: Secondary | ICD-10-CM | POA: Diagnosis not present

## 2021-03-26 DIAGNOSIS — Z720 Tobacco use: Secondary | ICD-10-CM | POA: Diagnosis not present

## 2021-03-26 DIAGNOSIS — E1165 Type 2 diabetes mellitus with hyperglycemia: Secondary | ICD-10-CM | POA: Diagnosis not present

## 2021-03-26 DIAGNOSIS — R221 Localized swelling, mass and lump, neck: Secondary | ICD-10-CM | POA: Diagnosis not present

## 2021-03-26 DIAGNOSIS — E039 Hypothyroidism, unspecified: Secondary | ICD-10-CM | POA: Diagnosis not present

## 2021-05-14 IMAGING — CT CT HEAD W/O CM
3 series · 15 of 45 positions shown, 18 images · non-contrast
Comparison: None.

CLINICAL DATA: Speech difficulty

EXAM:
CT HEAD WITHOUT CONTRAST
TECHNIQUE: Contiguous axial images were obtained from the base of the skull
through the vertex without intravenous contrast.

[Series 2: head wo · axial · 0.43mm/px · z∈[+202,+317]mm · 9 of 28 slices shown, 12 images]
[im 3/28  brain]
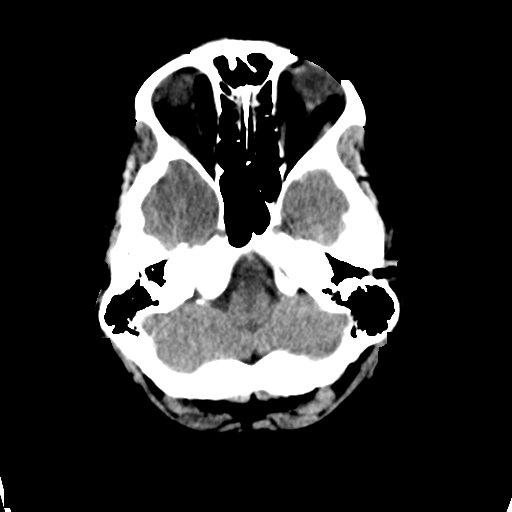
[im 3/28  bone]
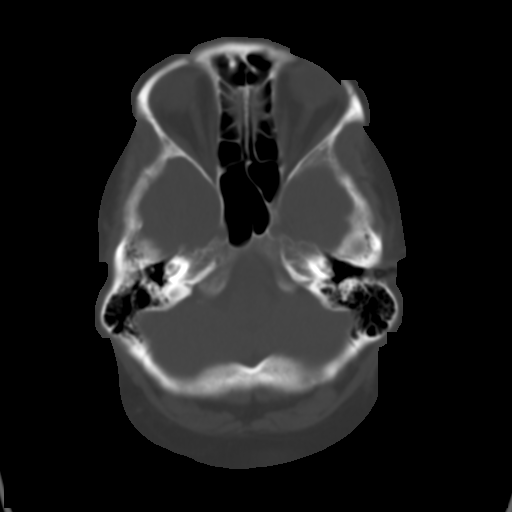
[im 6/28  brain]
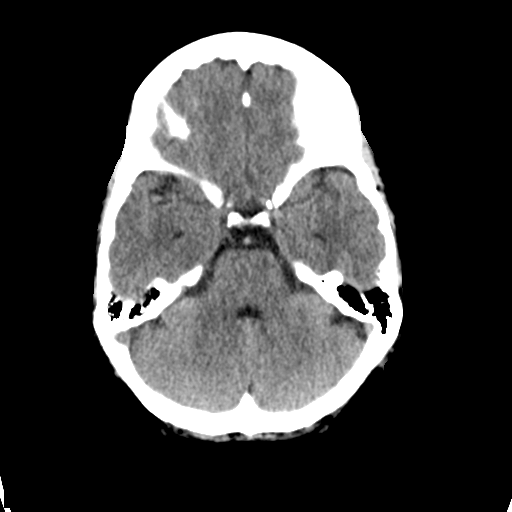
[im 9/28  brain]
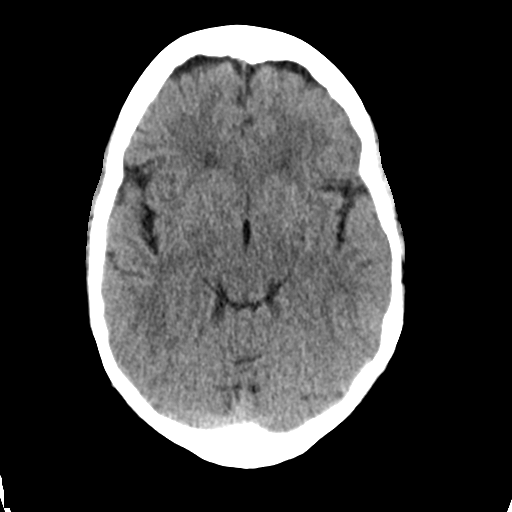
[im 12/28  brain]
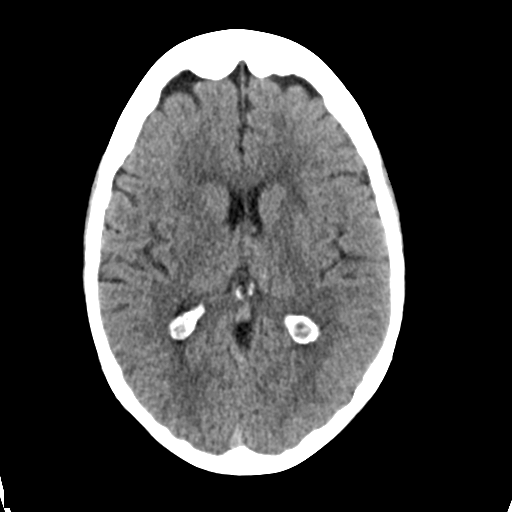
[im 15/28  brain]
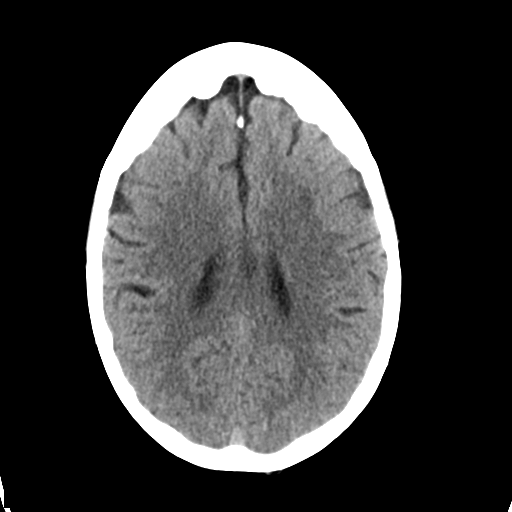
[im 15/28  bone]
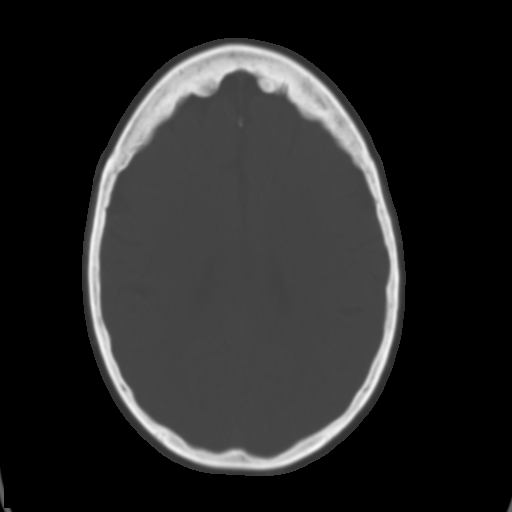
[im 17/28  brain]
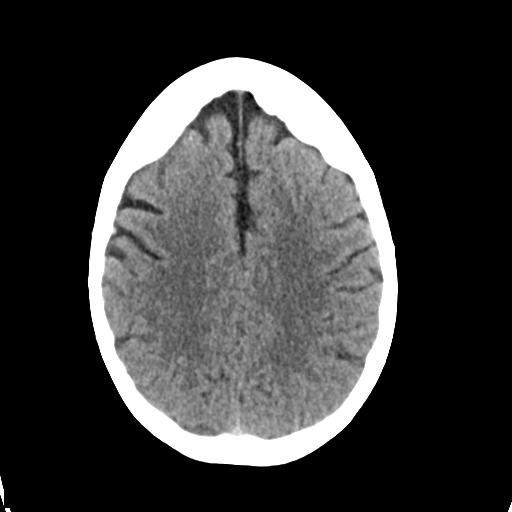
[im 20/28  brain]
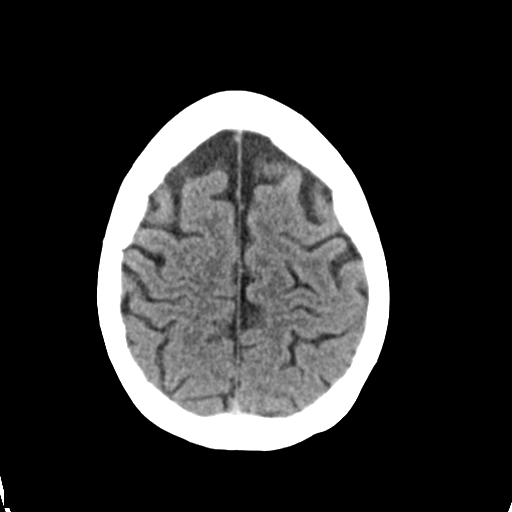
[im 23/28  brain]
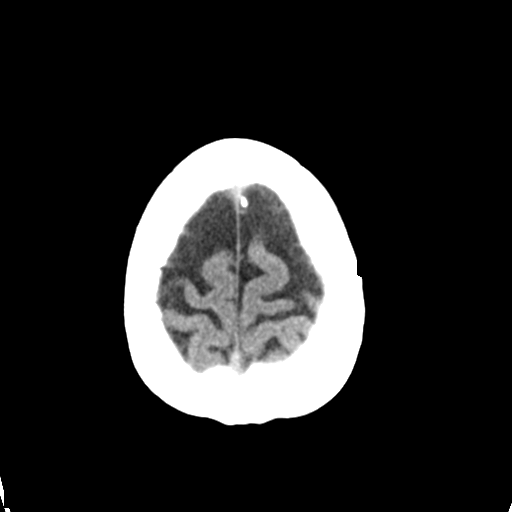
[im 26/28  brain]
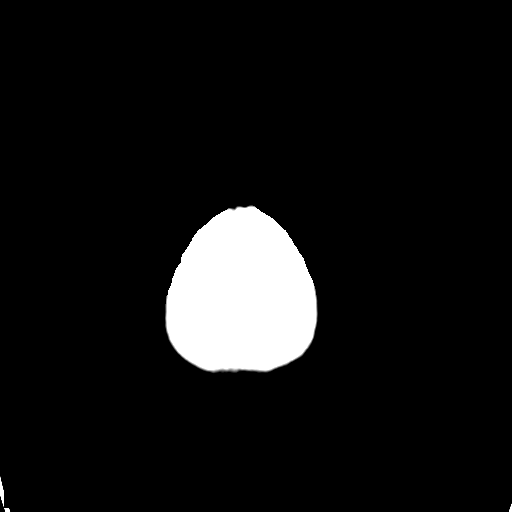
[im 26/28  bone]
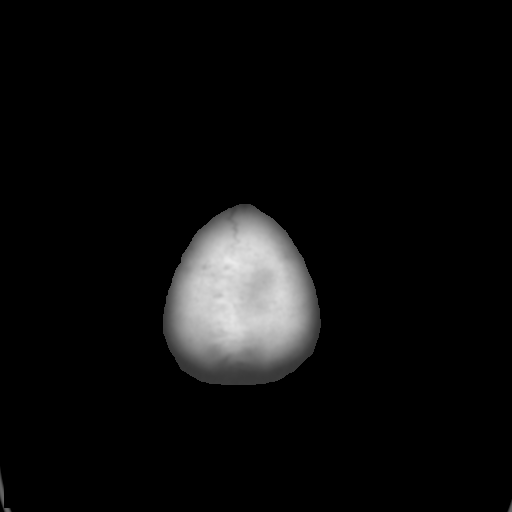

[Series 4: coronal soft tissue · coronal · 0.29mm/px · 3 of 66 slices shown]
[im 22/66  brain]
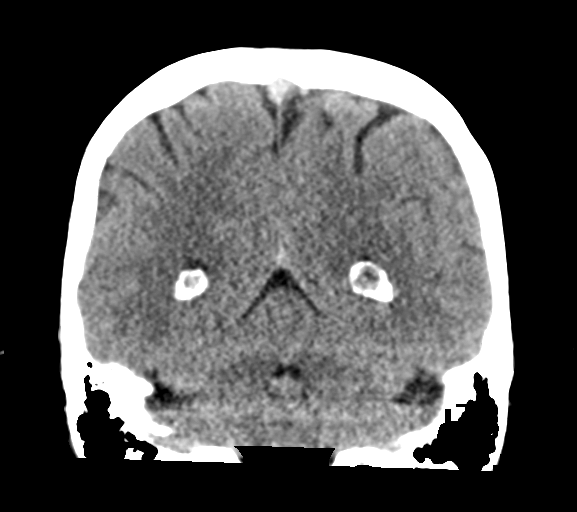
[im 29/66  brain]
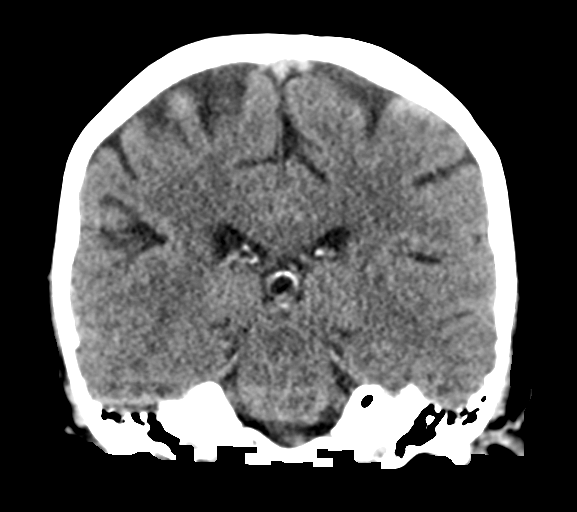
[im 37/66  brain]
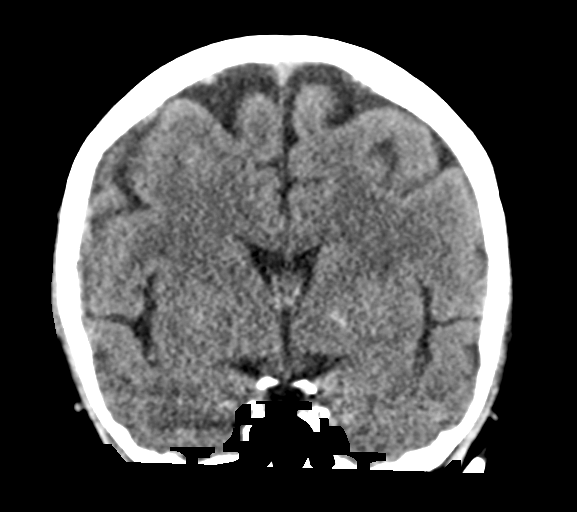

[Series 5: sagittal soft tissue · sagittal · 0.29mm/px · 3 of 52 slices shown]
[im 18/52  brain]
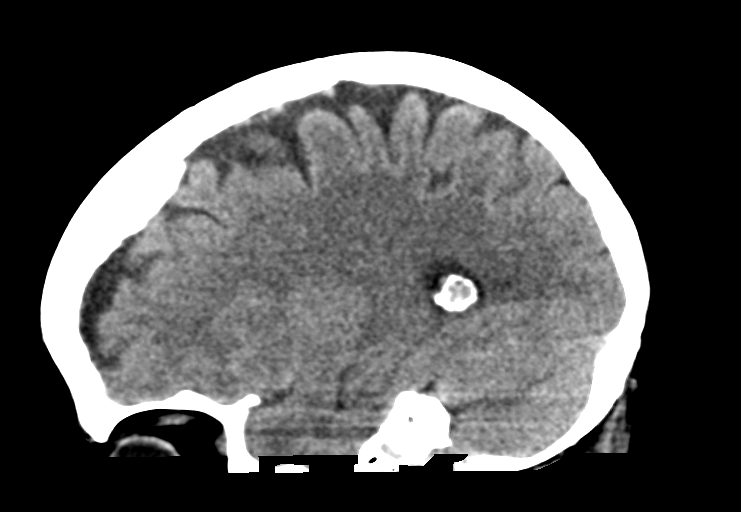
[im 26/52  brain]
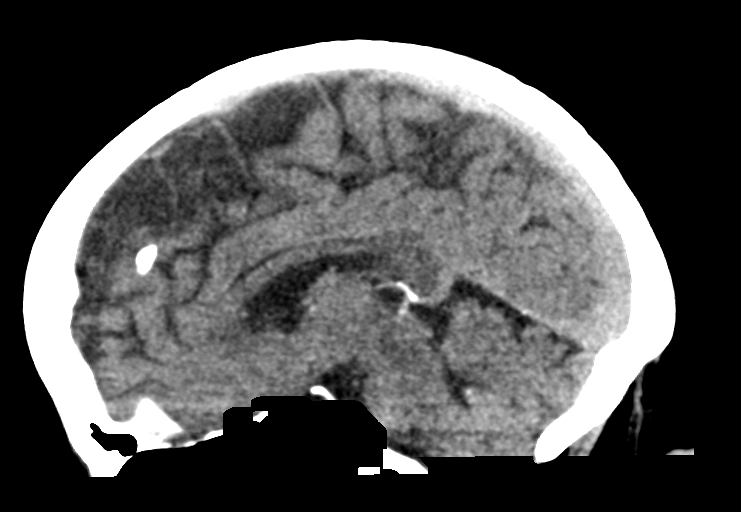
[im 35/52  brain]
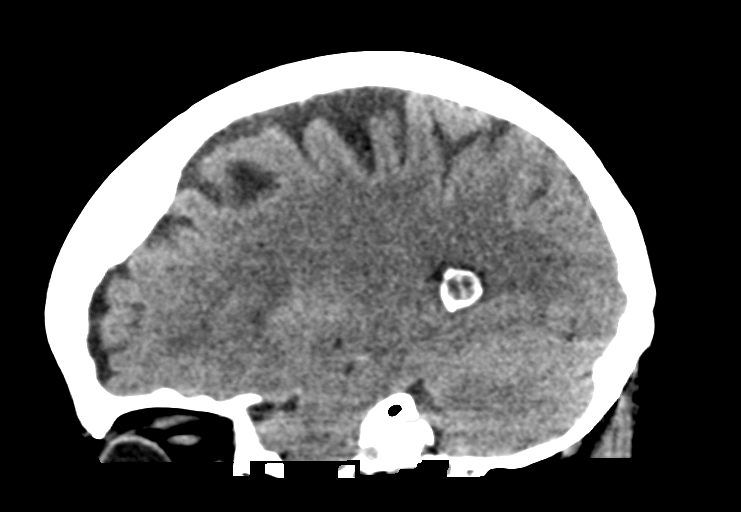

[15 of 45 positions shown; findings below may reference images not displayed]

FINDINGS: Brain: No acute intracranial abnormality. Specifically, no
hemorrhage, hydrocephalus, mass lesion, acute infarction, or
significant intracranial injury. Mild chronic small vessel disease
throughout the deep white matter.

Vascular: No hyperdense vessel or unexpected calcification.

Skull: No acute calvarial abnormality.

Sinuses/Orbits: Visualized paranasal sinuses and mastoids clear.
Orbital soft tissues unremarkable.

Other: None
IMPRESSION: Chronic small vessel changes throughout the deep white matter. No
acute intracranial abnormality.

## 2021-07-17 DIAGNOSIS — R892 Abnormal level of other drugs, medicaments and biological substances in specimens from other organs, systems and tissues: Secondary | ICD-10-CM | POA: Diagnosis not present

## 2021-07-17 DIAGNOSIS — E039 Hypothyroidism, unspecified: Secondary | ICD-10-CM | POA: Diagnosis not present

## 2021-07-17 DIAGNOSIS — F411 Generalized anxiety disorder: Secondary | ICD-10-CM | POA: Diagnosis not present

## 2021-07-17 DIAGNOSIS — R221 Localized swelling, mass and lump, neck: Secondary | ICD-10-CM | POA: Diagnosis not present

## 2021-07-17 DIAGNOSIS — E1165 Type 2 diabetes mellitus with hyperglycemia: Secondary | ICD-10-CM | POA: Diagnosis not present

## 2021-07-17 DIAGNOSIS — Z720 Tobacco use: Secondary | ICD-10-CM | POA: Diagnosis not present

## 2021-07-17 DIAGNOSIS — Z79891 Long term (current) use of opiate analgesic: Secondary | ICD-10-CM | POA: Diagnosis not present

## 2021-07-24 DIAGNOSIS — Z720 Tobacco use: Secondary | ICD-10-CM | POA: Diagnosis not present

## 2021-07-24 DIAGNOSIS — I10 Essential (primary) hypertension: Secondary | ICD-10-CM | POA: Diagnosis not present

## 2021-07-24 DIAGNOSIS — Z789 Other specified health status: Secondary | ICD-10-CM | POA: Diagnosis not present

## 2021-07-24 DIAGNOSIS — E039 Hypothyroidism, unspecified: Secondary | ICD-10-CM | POA: Diagnosis not present

## 2021-08-07 ENCOUNTER — Encounter (INDEPENDENT_AMBULATORY_CARE_PROVIDER_SITE_OTHER): Payer: 59

## 2021-08-07 ENCOUNTER — Ambulatory Visit (INDEPENDENT_AMBULATORY_CARE_PROVIDER_SITE_OTHER): Payer: 59 | Admitting: Vascular Surgery

## 2021-08-15 ENCOUNTER — Other Ambulatory Visit (INDEPENDENT_AMBULATORY_CARE_PROVIDER_SITE_OTHER): Payer: Self-pay | Admitting: Vascular Surgery

## 2021-08-15 DIAGNOSIS — I6523 Occlusion and stenosis of bilateral carotid arteries: Secondary | ICD-10-CM

## 2021-08-18 ENCOUNTER — Encounter (INDEPENDENT_AMBULATORY_CARE_PROVIDER_SITE_OTHER): Payer: Self-pay | Admitting: Nurse Practitioner

## 2021-08-18 ENCOUNTER — Ambulatory Visit (INDEPENDENT_AMBULATORY_CARE_PROVIDER_SITE_OTHER): Payer: 59 | Admitting: Nurse Practitioner

## 2021-08-18 ENCOUNTER — Other Ambulatory Visit: Payer: Self-pay

## 2021-08-18 ENCOUNTER — Ambulatory Visit (INDEPENDENT_AMBULATORY_CARE_PROVIDER_SITE_OTHER): Payer: 59

## 2021-08-18 VITALS — BP 157/75 | HR 67 | Resp 17 | Ht 63.5 in | Wt 127.0 lb

## 2021-08-18 DIAGNOSIS — E782 Mixed hyperlipidemia: Secondary | ICD-10-CM

## 2021-08-18 DIAGNOSIS — I10 Essential (primary) hypertension: Secondary | ICD-10-CM | POA: Diagnosis not present

## 2021-08-18 DIAGNOSIS — I6523 Occlusion and stenosis of bilateral carotid arteries: Secondary | ICD-10-CM

## 2021-08-18 DIAGNOSIS — Z720 Tobacco use: Secondary | ICD-10-CM

## 2021-08-27 ENCOUNTER — Encounter (INDEPENDENT_AMBULATORY_CARE_PROVIDER_SITE_OTHER): Payer: Self-pay | Admitting: Vascular Surgery

## 2021-08-30 ENCOUNTER — Encounter (INDEPENDENT_AMBULATORY_CARE_PROVIDER_SITE_OTHER): Payer: Self-pay | Admitting: Nurse Practitioner

## 2021-08-30 NOTE — Progress Notes (Signed)
? ?Subjective:  ? ? Patient ID: Andrea Rogers, female    DOB: 08/09/1960, 61 y.o.   MRN: [id number] ?Chief Complaint  ?Patient presents with  ? Follow-up  ? ? ?The patient is seen for follow up evaluation of carotid stenosis. The carotid stenosis followed by ultrasound.  ? ?The patient denies amaurosis fugax. There is no recent history of TIA symptoms or focal motor deficits. There is no prior documented CVA. ? ?The patient is taking enteric-coated aspirin 81 mg daily. ? ?There is no history of migraine headaches. There is no history of seizures. ? ?The patient has a history of coronary artery disease, no recent episodes of angina or shortness of breath. ?The patient denies PAD or claudication symptoms. ?There is a history of hyperlipidemia which is being treated with a statin.   ? ?Duplex ultrasound shows 1 to 39% stenosis of the right carotid artery with 60 to 79% stenosis of the left.  Previous study showed 40 to 59% stenosis in the right carotid artery with 60 to 79% on the left however the velocities are increased in the left ? ? ?Review of Systems  ?Eyes:  Negative for visual disturbance.  ?All other systems reviewed and are negative. ? ?   ?Objective:  ? Physical Exam ?Vitals reviewed.  ?HENT:  ?   Head: Normocephalic.  ?Cardiovascular:  ?   Rate and Rhythm: Normal rate.  ?Pulmonary:  ?   Effort: Pulmonary effort is normal.  ?Skin: ?   General: Skin is warm and dry.  ?Neurological:  ?   Mental Status: She is alert and oriented to person, place, and time.  ?Psychiatric:     ?   Mood and Affect: Mood normal.     ?   Behavior: Behavior normal.     ?   Thought Content: Thought content normal.     ?   Judgment: Judgment normal.  ? ? ?BP (!) 157/75 (BP Location: Right Arm)   Pulse 67   Resp 17   Ht 5' 3.5" (1.613 m)   Wt 127 lb (57.6 kg)   LMP  (LMP Unknown)   BMI 22.14 kg/m?  ? ?Past Medical History:  ?Diagnosis Date  ? Anal condyloma   ? Arthritis   ? Bronchitis   ? Diabetes mellitus without complication ([place])    ? GERD (gastroesophageal reflux disease)   ? Headache   ? HLD (hyperlipidemia)   ? Hypertension   ? Hypothyroidism   ? MD (muscular dystrophy) ([place])   ? Menopause   ? 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 Osteopenia   ? Recurrent UTI   ? Stroke [facility]) 02/2019  ? TIA  ? Thyroid disease   ? Tobacco abuse   ? ? ?Social History  ? ?Socioeconomic History  ? Marital status: Married  ?  Spouse name: Not on file  ? Number of children: Not on file  ? Years of education: Not on file  ? Highest education level: Not on file  ?Occupational History  ? Not on file  ?Tobacco Use  ? Smoking status: Every Day  ?  Packs/day: 1.00  ?  Types: Cigarettes  ? Smokeless tobacco: Never  ?Vaping Use  ? Vaping Use: Never used  ?Substance and Sexual Activity  ? Alcohol use: No  ? Drug use: No  ? Sexual activity: Not Currently  ?  Birth control/protection: Post-menopausal  ?Other Topics Concern  ? Not on file  ?Social History Narrative  ? Not on  file  ? ?Social Determinants of Health  ? ?Financial Resource Strain: Not on file  ?Food Insecurity: Not on file  ?Transportation Needs: Not on file  ?Physical Activity: Not on file  ?Stress: Not on file  ?Social Connections: Not on file  ?Intimate Partner Violence: Not on file  ? ? ?Past Surgical History:  ?Procedure Laterality Date  ? COLONOSCOPY WITH PROPOFOL N/A 04/25/2018  ? Procedure: COLONOSCOPY WITH PROPOFOL;  Surgeon: Lollie Sails, MD;  Location[facility] ENDOSCOPY;  Service: Endoscopy;  Laterality: N/A;  ? ENDARTERECTOMY Right 08/11/2019  ? Procedure: ENDARTERECTOMY CAROTID;  Surgeon: Katha Cabal, MD;  Location: [facility] ORS;  Service: Vascular;  Laterality: Right;  ? NO PAST SURGERIES    ? ? ?Family History  ?Adopted: Yes  ?Problem Relation Age of Onset  ? Breast cancer Mother   ? Colon cancer Father   ? Diabetes Father   ? ? ?Allergies  ?Allergen Reactions  ? Latex Rash  ? Penicillins Other (See Comments)  ?  Yeast infection ?Did it involve swelling of the face/tongue/throat, SOB, or  low BP? No ?Did it involve sudden or severe rash/hives, skin peeling, or any reaction on the inside of your mouth or nose? No ?Did you need to seek medical attention at a hospital or doctor's office? No ?When did it last happen?       ?If all above answers are "NO", may proceed with cephalosporin use. ?  ? ? ? ?  Latest Ref Rng & Units 08/12/2019  ?  4:26 AM 02/17/2019  ?  5:18 PM  ?CBC  ?WBC 4.0 - 10.5 K/uL 10.4   10.6    ?Hemoglobin 12.0 - 15.0 g/dL 10.4   15.5    ?Hematocrit 36.0 - 46.0 % 33.9   47.4    ?Platelets 150 - 400 K/uL 227   309    ? ? ? ? ?CMP  ?   ?Component Value Date/Time  ? NA 139 08/12/2019 0426  ? K 4.0 08/12/2019 0426  ? CL 103 08/12/2019 0426  ? CO2 28 08/12/2019 0426  ? GLUCOSE 124 (H) 08/12/2019 0426  ? BUN 14 08/12/2019 0426  ? CREATININE 0.60 02/05/2021 1318  ? CALCIUM 8.5 (L) 08/12/2019 0426  ? PROT 8.4 (H) 02/17/2019 1718  ? ALBUMIN 4.6 02/17/2019 1718  ? AST 27 02/17/2019 1718  ? ALT 23 02/17/2019 1718  ? ALKPHOS 73 02/17/2019 1718  ? BILITOT 0.7 02/17/2019 1718  ? GFRNONAA NOT CALCULATED 08/12/2019 0426  ? GFRAA NOT CALCULATED 08/12/2019 0426  ? ? ? ?No results found. ? ?   ?Assessment & Plan:  ? ?1. Essential hypertension ?Continue antihypertensive medications as already ordered, these medications have been reviewed and there are no changes at this time.  ? ?2. Carotid artery calcification, bilateral ?Recommend: ? ?Given the patient's asymptomatic subcritical stenosis no further invasive testing or surgery at this time. ? ?Duplex ultrasound shows 1 to 39% stenosis of the right carotid artery with 60 to 79% stenosis of the left ? ?Continue antiplatelet therapy as prescribed ?Continue management of CAD, HTN and Hyperlipidemia ?Healthy heart diet,  encouraged exercise at least 4 times per week ?Follow up in 6 months with duplex ultrasound and physical exam   ? ?3. Mixed hyperlipidemia ?Continue zetia as ordered and reviewed, no changes at this time  ? ?4. Tobacco abuse ?Smoking cessation  was discussed, 3-10 minutes spent on this topic specifically  ? ? ?Current Outpatient Medications on File Prior to Visit  ?Medication  Sig Dispense Refill  ? ALPRAZolam (XANAX) 0.25 MG tablet Take 0.25 mg by mouth at bedtime as needed for anxiety or sleep.     ? ALPRAZolam (XANAX) 0.5 MG tablet Take 0.5 mg by mouth at bedtime as needed.    ? ALPRAZolam (XANAX) 0.5 MG tablet Take by mouth.    ? aspirin EC 81 MG tablet Take 81 mg by mouth daily.    ? atenolol (TENORMIN) 100 MG tablet Take 200 mg by mouth daily.     ? ezetimibe (ZETIA) 10 MG tablet Take 10 mg by mouth at bedtime.     ? ferrous sulfate 325 (65 FE) MG EC tablet Take 325 mg by mouth 3 (three) times daily with meals.    ? glimepiride (AMARYL) 1 MG tablet Take 1 mg by mouth daily.     ? HYDROcodone-acetaminophen (NORCO) 7.5-325 MG tablet Take 1 tablet by mouth every 6 (six) hours as needed for moderate pain. Takes mostly at night    ? levothyroxine (SYNTHROID) 100 MCG tablet Take 100 mcg by mouth daily before breakfast.    ? metFORMIN (GLUCOPHAGE) 500 MG tablet Take 1,000 mg by mouth 2 (two) times daily with a meal.     ? omeprazole (PRILOSEC) 40 MG capsule Take 40 mg by mouth daily.    ? triamterene-hydrochlorothiazide (MAXZIDE) 75-50 MG tablet Take 1 tablet by mouth daily.     ? albuterol (VENTOLIN HFA) 108 (90 Base) MCG/ACT inhaler Inhale 2 puffs into the lungs every 6 (six) hours as needed for wheezing or shortness of breath.     ? ferrous sulfate 325 (65 FE) MG tablet Take by mouth.    ? meclizine (ANTIVERT) 25 MG tablet Take 25 mg by mouth 3 (three) times daily as needed. (Patient not taking: Reported on 08/18/2021)    ? metFORMIN (GLUCOPHAGE) 500 MG tablet Take 2 tablets by mouth 2 (two) times daily. (Patient not taking: Reported on 08/18/2021)    ? omeprazole (PRILOSEC) 40 MG capsule Take 1 tablet by mouth daily. (Patient not taking: Reported on 08/18/2021)    ? Prasterone (INTRAROSA) 6.5 MG INST Place 1 tablet vaginally at bedtime. (Patient not taking:  Reported on 08/18/2021) 30 each 11  ? ?No current facility-administered medications on file prior to visit.  ? ? ?There are no Patient Instructions on file for this visit. ?No follow-ups on file. ? ? ?Arna Medici

## 2021-12-01 DIAGNOSIS — E039 Hypothyroidism, unspecified: Secondary | ICD-10-CM | POA: Diagnosis not present

## 2021-12-01 DIAGNOSIS — I10 Essential (primary) hypertension: Secondary | ICD-10-CM | POA: Diagnosis not present

## 2021-12-01 DIAGNOSIS — R7309 Other abnormal glucose: Secondary | ICD-10-CM | POA: Diagnosis not present

## 2021-12-08 DIAGNOSIS — Z0000 Encounter for general adult medical examination without abnormal findings: Secondary | ICD-10-CM | POA: Diagnosis not present

## 2021-12-08 DIAGNOSIS — G2581 Restless legs syndrome: Secondary | ICD-10-CM | POA: Diagnosis not present

## 2021-12-08 DIAGNOSIS — M170 Bilateral primary osteoarthritis of knee: Secondary | ICD-10-CM | POA: Diagnosis not present

## 2021-12-08 DIAGNOSIS — Z789 Other specified health status: Secondary | ICD-10-CM | POA: Diagnosis not present

## 2021-12-08 DIAGNOSIS — G71039 Limb girdle muscular dystrophy, unspecified: Secondary | ICD-10-CM | POA: Diagnosis not present

## 2021-12-08 DIAGNOSIS — R252 Cramp and spasm: Secondary | ICD-10-CM | POA: Diagnosis not present

## 2021-12-08 DIAGNOSIS — F411 Generalized anxiety disorder: Secondary | ICD-10-CM | POA: Diagnosis not present

## 2021-12-08 DIAGNOSIS — E1165 Type 2 diabetes mellitus with hyperglycemia: Secondary | ICD-10-CM | POA: Diagnosis not present

## 2021-12-08 DIAGNOSIS — M19049 Primary osteoarthritis, unspecified hand: Secondary | ICD-10-CM | POA: Diagnosis not present

## 2021-12-08 DIAGNOSIS — Z720 Tobacco use: Secondary | ICD-10-CM | POA: Diagnosis not present

## 2021-12-08 DIAGNOSIS — I10 Essential (primary) hypertension: Secondary | ICD-10-CM | POA: Diagnosis not present

## 2021-12-08 DIAGNOSIS — E039 Hypothyroidism, unspecified: Secondary | ICD-10-CM | POA: Diagnosis not present

## 2021-12-11 DIAGNOSIS — R600 Localized edema: Secondary | ICD-10-CM | POA: Diagnosis not present

## 2021-12-11 DIAGNOSIS — E785 Hyperlipidemia, unspecified: Secondary | ICD-10-CM | POA: Diagnosis not present

## 2021-12-11 DIAGNOSIS — R002 Palpitations: Secondary | ICD-10-CM | POA: Diagnosis not present

## 2021-12-11 DIAGNOSIS — I498 Other specified cardiac arrhythmias: Secondary | ICD-10-CM | POA: Diagnosis not present

## 2021-12-11 DIAGNOSIS — E1121 Type 2 diabetes mellitus with diabetic nephropathy: Secondary | ICD-10-CM | POA: Diagnosis not present

## 2021-12-11 DIAGNOSIS — Z789 Other specified health status: Secondary | ICD-10-CM | POA: Diagnosis not present

## 2021-12-11 DIAGNOSIS — G71039 Limb girdle muscular dystrophy, unspecified: Secondary | ICD-10-CM | POA: Diagnosis not present

## 2021-12-11 DIAGNOSIS — E119 Type 2 diabetes mellitus without complications: Secondary | ICD-10-CM | POA: Diagnosis not present

## 2021-12-11 DIAGNOSIS — Z720 Tobacco use: Secondary | ICD-10-CM | POA: Diagnosis not present

## 2021-12-11 DIAGNOSIS — I10 Essential (primary) hypertension: Secondary | ICD-10-CM | POA: Diagnosis not present

## 2021-12-11 DIAGNOSIS — G459 Transient cerebral ischemic attack, unspecified: Secondary | ICD-10-CM | POA: Diagnosis not present

## 2021-12-11 DIAGNOSIS — I6523 Occlusion and stenosis of bilateral carotid arteries: Secondary | ICD-10-CM | POA: Diagnosis not present

## 2022-02-12 DIAGNOSIS — G2581 Restless legs syndrome: Secondary | ICD-10-CM | POA: Diagnosis not present

## 2022-02-12 DIAGNOSIS — R630 Anorexia: Secondary | ICD-10-CM | POA: Diagnosis not present

## 2022-02-12 DIAGNOSIS — E539 Vitamin B deficiency, unspecified: Secondary | ICD-10-CM | POA: Diagnosis not present

## 2022-02-12 DIAGNOSIS — Z8639 Personal history of other endocrine, nutritional and metabolic disease: Secondary | ICD-10-CM | POA: Diagnosis not present

## 2022-02-12 DIAGNOSIS — G5793 Unspecified mononeuropathy of bilateral lower limbs: Secondary | ICD-10-CM | POA: Diagnosis not present

## 2022-02-12 DIAGNOSIS — M549 Dorsalgia, unspecified: Secondary | ICD-10-CM | POA: Diagnosis not present

## 2022-02-12 DIAGNOSIS — R0989 Other specified symptoms and signs involving the circulatory and respiratory systems: Secondary | ICD-10-CM | POA: Diagnosis not present

## 2022-02-12 DIAGNOSIS — G71039 Limb girdle muscular dystrophy, unspecified: Secondary | ICD-10-CM | POA: Diagnosis not present

## 2022-02-18 ENCOUNTER — Other Ambulatory Visit (INDEPENDENT_AMBULATORY_CARE_PROVIDER_SITE_OTHER): Payer: Self-pay | Admitting: Vascular Surgery

## 2022-02-18 DIAGNOSIS — I6523 Occlusion and stenosis of bilateral carotid arteries: Secondary | ICD-10-CM

## 2022-02-19 ENCOUNTER — Encounter (INDEPENDENT_AMBULATORY_CARE_PROVIDER_SITE_OTHER): Payer: 59

## 2022-02-19 ENCOUNTER — Ambulatory Visit (INDEPENDENT_AMBULATORY_CARE_PROVIDER_SITE_OTHER): Payer: 59 | Admitting: Vascular Surgery

## 2022-02-19 ENCOUNTER — Encounter (INDEPENDENT_AMBULATORY_CARE_PROVIDER_SITE_OTHER): Payer: Self-pay

## 2022-02-20 DIAGNOSIS — R197 Diarrhea, unspecified: Secondary | ICD-10-CM | POA: Diagnosis not present

## 2022-02-26 ENCOUNTER — Ambulatory Visit (INDEPENDENT_AMBULATORY_CARE_PROVIDER_SITE_OTHER): Payer: 59 | Admitting: Vascular Surgery

## 2022-03-02 ENCOUNTER — Encounter (INDEPENDENT_AMBULATORY_CARE_PROVIDER_SITE_OTHER): Payer: Self-pay | Admitting: Vascular Surgery

## 2022-03-02 ENCOUNTER — Ambulatory Visit (INDEPENDENT_AMBULATORY_CARE_PROVIDER_SITE_OTHER): Payer: 59 | Admitting: Vascular Surgery

## 2022-03-02 ENCOUNTER — Ambulatory Visit (INDEPENDENT_AMBULATORY_CARE_PROVIDER_SITE_OTHER): Payer: 59

## 2022-03-02 VITALS — BP 136/71 | HR 69 | Resp 16 | Wt 126.2 lb

## 2022-03-02 DIAGNOSIS — I10 Essential (primary) hypertension: Secondary | ICD-10-CM | POA: Diagnosis not present

## 2022-03-02 DIAGNOSIS — E782 Mixed hyperlipidemia: Secondary | ICD-10-CM

## 2022-03-02 DIAGNOSIS — I6523 Occlusion and stenosis of bilateral carotid arteries: Secondary | ICD-10-CM

## 2022-03-02 DIAGNOSIS — E1159 Type 2 diabetes mellitus with other circulatory complications: Secondary | ICD-10-CM

## 2022-03-02 NOTE — Progress Notes (Signed)
MRN : [id number]  Andrea Rogers is a 61 y.o. ([birth year]) female who presents with chief complaint of check carotid arteries.  History of Present Illness:  The patient is seen for follow up evaluation of carotid stenosis. The carotid stenosis followed by ultrasound.    The patient denies amaurosis fugax. There is no recent history of TIA symptoms or focal motor deficits. There is no prior documented CVA.   The patient is taking enteric-coated aspirin 81 mg daily.   There is no history of migraine headaches. There is no history of seizures.   The patient has a history of coronary artery disease, no recent episodes of angina or shortness of breath. The patient denies PAD or claudication symptoms. There is a history of hyperlipidemia which is being treated with a statin.     Duplex ultrasound shows 1 to 39% stenosis of the right carotid artery with 60 to 79% stenosis of the left.  Previous study showed 40 to 59% stenosis in the right carotid artery with 60 to 79% on the left however the velocities are increased in the left  No outpatient medications have been marked as taking for the 03/02/22 encounter (Appointment) with Delana Meyer, Dolores Lory, MD.    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[place])    Osteopenia    Recurrent UTI    Stroke ([place]) 02/2019   TIA   Thyroid disease    Tobacco abuse     Past Surgical History:  Procedure Laterality Date   COLONOSCOPY WITH PROPOFOL N/A 04/25/2018   Procedure: COLONOSCOPY WITH PROPOFOL;  Surgeon: Lollie Sails, MD;  Location: [facility] ENDOSCOPY;  Service: Endoscopy;  Laterality: N/A;   ENDARTERECTOMY Right 08/11/2019   Procedure: ENDARTERECTOMY CAROTID;   Surgeon: Katha Cabal, MD;  Location: [facility] ORS;  Service: Vascular;  Laterality: Right;   NO PAST SURGERIES      Social History Social History   Tobacco Use   Smoking status: Every Day    Packs/day: 1.00    Types: Cigarettes   Smokeless tobacco: Never  Vaping Use   Vaping Use: Never used  Substance Use Topics   Alcohol use: No   Drug use: No    Family History Family History  Adopted: Yes  Problem Relation Age of Onset   Breast cancer Mother    Colon cancer Father    Diabetes Father     Allergies  Allergen Reactions   Latex Rash   Penicillins Other (See Comments)    Yeast infection Did it involve swelling of the face/tongue/throat, SOB, or low BP? No Did it involve sudden or severe rash/hives, skin peeling, or any reaction on the inside of your mouth or nose? No Did you need to seek medical attention at a hospital or doctor's office? No When did it last happen?       If all above answers are "NO", may proceed with cephalosporin use.      REVIEW OF SYSTEMS (Negative unless checked)  Constitutional: '[]'$ Weight loss  '[]'$ Fever  '[]'$ Chills Cardiac: '[]'$ Chest pain   '[]'$ Chest pressure   '[]'$ Palpitations   '[]'$ Shortness of breath when laying  flat   '[]'$ Shortness of breath with exertion. Vascular:  '[x]'$ Pain in legs with walking   '[]'$ Pain in legs at rest  '[]'$ History of DVT   '[]'$ Phlebitis   '[]'$ Swelling in legs   '[]'$ Varicose veins   '[]'$ Non-healing ulcers Pulmonary:   '[]'$ Uses home oxygen   '[]'$ Productive cough   '[]'$ Hemoptysis   '[]'$ Wheeze  '[]'$ COPD   '[]'$ Asthma Neurologic:  '[]'$ Dizziness   '[]'$ Seizures   '[]'$ History of stroke   '[]'$ History of TIA  '[]'$ Aphasia   '[]'$ Vissual changes   '[]'$ Weakness or numbness in arm   '[]'$ Weakness or numbness in leg Musculoskeletal:   '[]'$ Joint swelling   '[]'$ Joint pain   '[]'$ Low back pain Hematologic:  '[]'$ Easy bruising  '[]'$ Easy bleeding   '[]'$ Hypercoagulable state   '[]'$ Anemic Gastrointestinal:  '[]'$ Diarrhea   '[]'$ Vomiting  '[]'$ Gastroesophageal reflux/heartburn   '[]'$ Difficulty swallowing. Genitourinary:   '[]'$ Chronic kidney disease   '[]'$ Difficult urination  '[]'$ Frequent urination   '[]'$ Blood in urine Skin:  '[]'$ Rashes   '[]'$ Ulcers  Psychological:  '[]'$ History of anxiety   '[]'$  History of major depression.  Physical Examination  There were no vitals filed for this visit. There is no height or weight on file to calculate BMI. Gen: WD/WN, NAD Head: [place]/AT, No temporalis wasting.  Ear/Nose/Throat: Hearing grossly intact, nares w/o erythema or drainage Eyes: PER, EOMI, sclera nonicteric.  Neck: Supple, no masses.  No bruit or JVD.  Pulmonary:  Good air movement, no audible wheezing, no use of accessory muscles.  Cardiac: RRR, normal S1, S2, no Murmurs. Vascular:  carotid bruit noted Vessel Right Left  Radial Palpable Palpable  Carotid  Palpable  Palpable  Subclav  Palpable Palpable  Gastrointestinal: soft, non-distended. No guarding/no peritoneal signs.  Musculoskeletal: M/S 5/5 throughout.  No visible deformity.  Neurologic: CN 2-12 intact. Pain and light touch intact in extremities.  Symmetrical.  Speech is fluent. Motor exam as listed above. Psychiatric: Judgment intact, Mood & affect appropriate for pt's clinical situation. Dermatologic: No rashes or ulcers noted.  No changes consistent with cellulitis.   CBC Lab Results  Component Value Date   WBC 10.4 08/12/2019   HGB 10.4 (L) 08/12/2019   HCT 33.9 (L) 08/12/2019   MCV 96.6 08/12/2019   PLT 227 08/12/2019    BMET    Component Value Date/Time   NA 139 08/12/2019 0426   K 4.0 08/12/2019 0426   CL 103 08/12/2019 0426   CO2 28 08/12/2019 0426   GLUCOSE 124 (H) 08/12/2019 0426   BUN 14 08/12/2019 0426   CREATININE 0.60 02/05/2021 1318   CALCIUM 8.5 (L) 08/12/2019 0426   GFRNONAA NOT CALCULATED 08/12/2019 0426   GFRAA NOT CALCULATED 08/12/2019 0426   CrCl cannot be calculated (Patient's most recent lab result is older than the maximum 21 days allowed.).  COAG Lab Results  Component Value Date   INR 0.9 08/09/2019    Radiology No  results found.   Assessment/Plan 1. Bilateral carotid artery stenosis Recommend:  Given the patient's asymptomatic subcritical stenosis no further invasive testing or surgery at this time.  Duplex ultrasound shows <50% stenosis bilaterally.  Continue antiplatelet therapy as prescribed Continue management of CAD, HTN and Hyperlipidemia Healthy heart diet,  encouraged exercise at least 4 times per week Follow up in 12 months with duplex ultrasound and physical exam   - VAS US CAROTID; Future  2. Essential hypertension Continue antihypertensive medications as already ordered, these medications have been reviewed and there are no changes at this time.   3. Controlled type 2 diabetes mellitus with other circulatory  complication, without long-term current use of insulin ([place]) Continue hypoglycemic medications as already ordered, these medications have been reviewed and there are no changes at this time.  Hgb A1C to be monitored as already arranged by primary service   4. Mixed hyperlipidemia Continue statin as ordered and reviewed, no changes at this time     Hortencia Pilar, MD  03/02/2022 1:27 PM

## 2022-03-23 ENCOUNTER — Encounter (INDEPENDENT_AMBULATORY_CARE_PROVIDER_SITE_OTHER): Payer: Self-pay

## 2022-04-02 ENCOUNTER — Encounter (INDEPENDENT_AMBULATORY_CARE_PROVIDER_SITE_OTHER): Payer: 59

## 2022-04-02 DIAGNOSIS — M170 Bilateral primary osteoarthritis of knee: Secondary | ICD-10-CM | POA: Diagnosis not present

## 2022-04-02 DIAGNOSIS — G71039 Limb girdle muscular dystrophy, unspecified: Secondary | ICD-10-CM | POA: Diagnosis not present

## 2022-04-02 DIAGNOSIS — E039 Hypothyroidism, unspecified: Secondary | ICD-10-CM | POA: Diagnosis not present

## 2022-04-02 DIAGNOSIS — E1165 Type 2 diabetes mellitus with hyperglycemia: Secondary | ICD-10-CM | POA: Diagnosis not present

## 2022-04-02 DIAGNOSIS — Z789 Other specified health status: Secondary | ICD-10-CM | POA: Diagnosis not present

## 2022-04-02 DIAGNOSIS — I10 Essential (primary) hypertension: Secondary | ICD-10-CM | POA: Diagnosis not present

## 2022-04-02 DIAGNOSIS — M19049 Primary osteoarthritis, unspecified hand: Secondary | ICD-10-CM | POA: Diagnosis not present

## 2022-04-02 DIAGNOSIS — F411 Generalized anxiety disorder: Secondary | ICD-10-CM | POA: Diagnosis not present

## 2022-04-02 DIAGNOSIS — G2581 Restless legs syndrome: Secondary | ICD-10-CM | POA: Diagnosis not present

## 2022-04-02 DIAGNOSIS — R252 Cramp and spasm: Secondary | ICD-10-CM | POA: Diagnosis not present

## 2022-04-02 DIAGNOSIS — Z720 Tobacco use: Secondary | ICD-10-CM | POA: Diagnosis not present

## 2022-04-02 DIAGNOSIS — Z0000 Encounter for general adult medical examination without abnormal findings: Secondary | ICD-10-CM | POA: Diagnosis not present

## 2022-04-09 DIAGNOSIS — G71039 Limb girdle muscular dystrophy, unspecified: Secondary | ICD-10-CM | POA: Diagnosis not present

## 2022-04-09 DIAGNOSIS — F411 Generalized anxiety disorder: Secondary | ICD-10-CM | POA: Diagnosis not present

## 2022-04-09 DIAGNOSIS — Z720 Tobacco use: Secondary | ICD-10-CM | POA: Diagnosis not present

## 2022-04-09 DIAGNOSIS — M549 Dorsalgia, unspecified: Secondary | ICD-10-CM | POA: Diagnosis not present

## 2022-04-09 DIAGNOSIS — Z789 Other specified health status: Secondary | ICD-10-CM | POA: Diagnosis not present

## 2022-04-09 DIAGNOSIS — R768 Other specified abnormal immunological findings in serum: Secondary | ICD-10-CM | POA: Diagnosis not present

## 2022-04-09 DIAGNOSIS — E1165 Type 2 diabetes mellitus with hyperglycemia: Secondary | ICD-10-CM | POA: Diagnosis not present

## 2022-04-09 DIAGNOSIS — I10 Essential (primary) hypertension: Secondary | ICD-10-CM | POA: Diagnosis not present

## 2022-08-06 DIAGNOSIS — Z720 Tobacco use: Secondary | ICD-10-CM | POA: Diagnosis not present

## 2022-08-06 DIAGNOSIS — I10 Essential (primary) hypertension: Secondary | ICD-10-CM | POA: Diagnosis not present

## 2022-08-06 DIAGNOSIS — M549 Dorsalgia, unspecified: Secondary | ICD-10-CM | POA: Diagnosis not present

## 2022-08-06 DIAGNOSIS — F411 Generalized anxiety disorder: Secondary | ICD-10-CM | POA: Diagnosis not present

## 2022-08-06 DIAGNOSIS — E1165 Type 2 diabetes mellitus with hyperglycemia: Secondary | ICD-10-CM | POA: Diagnosis not present

## 2022-08-06 DIAGNOSIS — G71039 Limb girdle muscular dystrophy, unspecified: Secondary | ICD-10-CM | POA: Diagnosis not present

## 2022-08-06 DIAGNOSIS — Z789 Other specified health status: Secondary | ICD-10-CM | POA: Diagnosis not present

## 2022-08-13 DIAGNOSIS — Z720 Tobacco use: Secondary | ICD-10-CM | POA: Diagnosis not present

## 2022-08-13 DIAGNOSIS — F17210 Nicotine dependence, cigarettes, uncomplicated: Secondary | ICD-10-CM | POA: Diagnosis not present

## 2022-08-13 DIAGNOSIS — I10 Essential (primary) hypertension: Secondary | ICD-10-CM | POA: Diagnosis not present

## 2022-08-13 DIAGNOSIS — G71039 Limb girdle muscular dystrophy, unspecified: Secondary | ICD-10-CM | POA: Diagnosis not present

## 2022-08-13 DIAGNOSIS — E1165 Type 2 diabetes mellitus with hyperglycemia: Secondary | ICD-10-CM | POA: Diagnosis not present

## 2022-08-13 DIAGNOSIS — F411 Generalized anxiety disorder: Secondary | ICD-10-CM | POA: Diagnosis not present

## 2022-08-13 DIAGNOSIS — M170 Bilateral primary osteoarthritis of knee: Secondary | ICD-10-CM | POA: Diagnosis not present

## 2022-08-13 DIAGNOSIS — E039 Hypothyroidism, unspecified: Secondary | ICD-10-CM | POA: Diagnosis not present

## 2022-08-13 DIAGNOSIS — R634 Abnormal weight loss: Secondary | ICD-10-CM | POA: Diagnosis not present

## 2022-08-13 DIAGNOSIS — M19049 Primary osteoarthritis, unspecified hand: Secondary | ICD-10-CM | POA: Diagnosis not present

## 2022-08-13 DIAGNOSIS — R29898 Other symptoms and signs involving the musculoskeletal system: Secondary | ICD-10-CM | POA: Diagnosis not present

## 2022-09-10 DIAGNOSIS — Z720 Tobacco use: Secondary | ICD-10-CM | POA: Diagnosis not present

## 2022-09-10 DIAGNOSIS — E785 Hyperlipidemia, unspecified: Secondary | ICD-10-CM | POA: Diagnosis not present

## 2022-09-10 DIAGNOSIS — I6523 Occlusion and stenosis of bilateral carotid arteries: Secondary | ICD-10-CM | POA: Diagnosis not present

## 2022-09-10 DIAGNOSIS — G459 Transient cerebral ischemic attack, unspecified: Secondary | ICD-10-CM | POA: Diagnosis not present

## 2022-09-10 DIAGNOSIS — G71039 Limb girdle muscular dystrophy, unspecified: Secondary | ICD-10-CM | POA: Diagnosis not present

## 2022-09-10 DIAGNOSIS — I10 Essential (primary) hypertension: Secondary | ICD-10-CM | POA: Diagnosis not present

## 2022-09-10 DIAGNOSIS — R002 Palpitations: Secondary | ICD-10-CM | POA: Diagnosis not present

## 2022-09-10 DIAGNOSIS — Z789 Other specified health status: Secondary | ICD-10-CM | POA: Diagnosis not present

## 2022-09-10 DIAGNOSIS — E1121 Type 2 diabetes mellitus with diabetic nephropathy: Secondary | ICD-10-CM | POA: Diagnosis not present

## 2022-10-13 DIAGNOSIS — G71039 Limb girdle muscular dystrophy, unspecified: Secondary | ICD-10-CM | POA: Diagnosis not present

## 2022-10-13 DIAGNOSIS — G5793 Unspecified mononeuropathy of bilateral lower limbs: Secondary | ICD-10-CM | POA: Diagnosis not present

## 2022-12-01 ENCOUNTER — Other Ambulatory Visit: Payer: Self-pay

## 2022-12-01 ENCOUNTER — Emergency Department: Payer: 59

## 2022-12-01 ENCOUNTER — Observation Stay
Admission: EM | Admit: 2022-12-01 | Discharge: 2022-12-02 | Disposition: A | Discharge status: Home / Self Care | Payer: 59 | Attending: Internal Medicine | Admitting: Internal Medicine

## 2022-12-01 DIAGNOSIS — M549 Dorsalgia, unspecified: Secondary | ICD-10-CM | POA: Diagnosis not present

## 2022-12-01 DIAGNOSIS — Z789 Other specified health status: Secondary | ICD-10-CM | POA: Diagnosis present

## 2022-12-01 DIAGNOSIS — G71039 Limb girdle muscular dystrophy, unspecified: Secondary | ICD-10-CM | POA: Diagnosis not present

## 2022-12-01 DIAGNOSIS — S3992XA Unspecified injury of lower back, initial encounter: Secondary | ICD-10-CM | POA: Diagnosis not present

## 2022-12-01 DIAGNOSIS — F17210 Nicotine dependence, cigarettes, uncomplicated: Secondary | ICD-10-CM | POA: Diagnosis not present

## 2022-12-01 DIAGNOSIS — Z8673 Personal history of transient ischemic attack (TIA), and cerebral infarction without residual deficits: Secondary | ICD-10-CM | POA: Diagnosis not present

## 2022-12-01 DIAGNOSIS — S299XXA Unspecified injury of thorax, initial encounter: Secondary | ICD-10-CM | POA: Diagnosis not present

## 2022-12-01 DIAGNOSIS — Z79899 Other long term (current) drug therapy: Secondary | ICD-10-CM | POA: Insufficient documentation

## 2022-12-01 DIAGNOSIS — S064X0A Epidural hemorrhage without loss of consciousness, initial encounter: Principal | ICD-10-CM | POA: Insufficient documentation

## 2022-12-01 DIAGNOSIS — I700 Atherosclerosis of aorta: Secondary | ICD-10-CM | POA: Diagnosis not present

## 2022-12-01 DIAGNOSIS — M4807 Spinal stenosis, lumbosacral region: Secondary | ICD-10-CM | POA: Insufficient documentation

## 2022-12-01 DIAGNOSIS — W01190A Fall on same level from slipping, tripping and stumbling with subsequent striking against furniture, initial encounter: Secondary | ICD-10-CM | POA: Insufficient documentation

## 2022-12-01 DIAGNOSIS — Z720 Tobacco use: Secondary | ICD-10-CM | POA: Diagnosis present

## 2022-12-01 DIAGNOSIS — S064XAA Epidural hemorrhage with loss of consciousness status unknown, initial encounter: Secondary | ICD-10-CM | POA: Diagnosis not present

## 2022-12-01 DIAGNOSIS — S32018A Other fracture of first lumbar vertebra, initial encounter for closed fracture: Secondary | ICD-10-CM | POA: Diagnosis not present

## 2022-12-01 DIAGNOSIS — E871 Hypo-osmolality and hyponatremia: Secondary | ICD-10-CM | POA: Diagnosis not present

## 2022-12-01 DIAGNOSIS — M533 Sacrococcygeal disorders, not elsewhere classified: Secondary | ICD-10-CM | POA: Insufficient documentation

## 2022-12-01 DIAGNOSIS — S0990XA Unspecified injury of head, initial encounter: Secondary | ICD-10-CM | POA: Diagnosis not present

## 2022-12-01 DIAGNOSIS — M545 Low back pain, unspecified: Secondary | ICD-10-CM | POA: Insufficient documentation

## 2022-12-01 DIAGNOSIS — E039 Hypothyroidism, unspecified: Secondary | ICD-10-CM | POA: Diagnosis not present

## 2022-12-01 DIAGNOSIS — W19XXXA Unspecified fall, initial encounter: Secondary | ICD-10-CM

## 2022-12-01 DIAGNOSIS — M6258 Muscle wasting and atrophy, not elsewhere classified, other site: Secondary | ICD-10-CM | POA: Diagnosis not present

## 2022-12-01 DIAGNOSIS — E785 Hyperlipidemia, unspecified: Secondary | ICD-10-CM | POA: Diagnosis present

## 2022-12-01 DIAGNOSIS — M47814 Spondylosis without myelopathy or radiculopathy, thoracic region: Secondary | ICD-10-CM | POA: Diagnosis not present

## 2022-12-01 DIAGNOSIS — S3993XA Unspecified injury of pelvis, initial encounter: Secondary | ICD-10-CM | POA: Diagnosis not present

## 2022-12-01 DIAGNOSIS — S32000A Wedge compression fracture of unspecified lumbar vertebra, initial encounter for closed fracture: Secondary | ICD-10-CM | POA: Diagnosis present

## 2022-12-01 DIAGNOSIS — I10 Essential (primary) hypertension: Secondary | ICD-10-CM | POA: Diagnosis present

## 2022-12-01 DIAGNOSIS — Z7984 Long term (current) use of oral hypoglycemic drugs: Secondary | ICD-10-CM | POA: Diagnosis not present

## 2022-12-01 DIAGNOSIS — S32010A Wedge compression fracture of first lumbar vertebra, initial encounter for closed fracture: Secondary | ICD-10-CM | POA: Diagnosis not present

## 2022-12-01 DIAGNOSIS — G2581 Restless legs syndrome: Secondary | ICD-10-CM | POA: Diagnosis present

## 2022-12-01 DIAGNOSIS — Z7982 Long term (current) use of aspirin: Secondary | ICD-10-CM | POA: Diagnosis not present

## 2022-12-01 DIAGNOSIS — S199XXA Unspecified injury of neck, initial encounter: Secondary | ICD-10-CM | POA: Diagnosis not present

## 2022-12-01 DIAGNOSIS — E119 Type 2 diabetes mellitus without complications: Secondary | ICD-10-CM

## 2022-12-01 DIAGNOSIS — Z91040 Latex allergy status: Secondary | ICD-10-CM | POA: Insufficient documentation

## 2022-12-01 DIAGNOSIS — R29890 Loss of height: Secondary | ICD-10-CM | POA: Diagnosis not present

## 2022-12-01 DIAGNOSIS — M5137 Other intervertebral disc degeneration, lumbosacral region: Secondary | ICD-10-CM | POA: Diagnosis not present

## 2022-12-01 DIAGNOSIS — M170 Bilateral primary osteoarthritis of knee: Secondary | ICD-10-CM | POA: Diagnosis not present

## 2022-12-01 DIAGNOSIS — F411 Generalized anxiety disorder: Secondary | ICD-10-CM | POA: Diagnosis present

## 2022-12-01 DIAGNOSIS — S32019A Unspecified fracture of first lumbar vertebra, initial encounter for closed fracture: Secondary | ICD-10-CM | POA: Diagnosis not present

## 2022-12-01 LAB — COMPREHENSIVE METABOLIC PANEL
ALT: 20 U/L (ref 0–44)
AST: 18 U/L (ref 15–41)
Albumin: 4 g/dL (ref 3.5–5.0)
Alkaline Phosphatase: 49 U/L (ref 38–126)
Anion gap: 9 (ref 5–15)
BUN: 24 mg/dL — ABNORMAL HIGH (ref 8–23)
CO2: 27 mmol/L (ref 22–32)
Calcium: 9.1 mg/dL (ref 8.9–10.3)
Chloride: 97 mmol/L — ABNORMAL LOW (ref 98–111)
Creatinine, Ser: 0.68 mg/dL (ref 0.44–1.00)
GFR, Estimated: >60 mL/min (ref >60)
Glucose, Bld: 106 mg/dL — ABNORMAL HIGH (ref 70–99)
Potassium: 4.3 mmol/L (ref 3.5–5.1)
Sodium: 133 mmol/L — ABNORMAL LOW (ref 135–145)
Total Bilirubin: 0.7 mg/dL (ref 0.3–1.2)
Total Protein: 7.1 g/dL (ref 6.5–8.1)

## 2022-12-01 LAB — CBG MONITORING, ED: Glucose-Capillary: 109 mg/dL — ABNORMAL HIGH (ref 70–99)

## 2022-12-01 LAB — CBC WITH DIFFERENTIAL/PLATELET
Abs Immature Granulocytes: 0.03 10*3/uL (ref 0.00–0.07)
Basophils Absolute: 0.1 10*3/uL (ref 0.0–0.1)
Basophils Relative: 1 %
Eosinophils Absolute: 0.1 10*3/uL (ref 0.0–0.5)
Eosinophils Relative: 1 %
HCT: 45.5 % (ref 36.0–46.0)
Hemoglobin: 15.2 g/dL — ABNORMAL HIGH (ref 12.0–15.0)
Immature Granulocytes: 0 %
Lymphocytes Relative: 18 %
Lymphs Abs: 1.8 10*3/uL (ref 0.7–4.0)
MCH: 32.5 pg (ref 26.0–34.0)
MCHC: 33.4 g/dL (ref 30.0–36.0)
MCV: 97.4 fL (ref 80.0–100.0)
Monocytes Absolute: 0.9 10*3/uL (ref 0.1–1.0)
Monocytes Relative: 9 %
Neutro Abs: 7.5 10*3/uL (ref 1.7–7.7)
Neutrophils Relative %: 71 %
Platelets: 251 10*3/uL (ref 150–400)
RBC: 4.67 MIL/uL (ref 3.87–5.11)
RDW: 13.1 % (ref 11.5–15.5)
WBC: 10.4 10*3/uL (ref 4.0–10.5)
nRBC: 0 % (ref 0.0–0.2)

## 2022-12-01 LAB — LIPASE, BLOOD: Lipase: 32 U/L (ref 11–51)

## 2022-12-01 MED ORDER — INSULIN ASPART 100 UNIT/ML IJ SOLN: 0.0000 [IU] | Freq: Every day | INTRAMUSCULAR | Status: DC

## 2022-12-01 MED ORDER — ONDANSETRON 4 MG PO TBDP
4.0000 mg | ORAL_TABLET | Freq: Once | ORAL | Status: AC
  Administered 2022-12-01: 4 mg via ORAL
  Filled 2022-12-01: qty 1

## 2022-12-01 MED ORDER — LEVOTHYROXINE SODIUM 50 MCG PO TABS
100.0000 ug | ORAL_TABLET | Freq: Every day | ORAL | Status: DC
  Administered 2022-12-02: 100 ug via ORAL
  Filled 2022-12-01: qty 2

## 2022-12-01 MED ORDER — LORAZEPAM 1 MG PO TABS
0.5000 mg | ORAL_TABLET | Freq: Once | ORAL | Status: AC
  Administered 2022-12-01: 0.5 mg via ORAL
  Filled 2022-12-01: qty 1

## 2022-12-01 MED ORDER — SENNOSIDES-DOCUSATE SODIUM 8.6-50 MG PO TABS: 1.0000 | ORAL_TABLET | Freq: Every evening | ORAL | Status: DC | PRN

## 2022-12-01 MED ORDER — OXYCODONE-ACETAMINOPHEN 5-325 MG PO TABS
1.0000 | ORAL_TABLET | Freq: Once | ORAL | Status: AC
  Administered 2022-12-01: 1 via ORAL
  Filled 2022-12-01: qty 1

## 2022-12-01 MED ORDER — INSULIN ASPART 100 UNIT/ML IJ SOLN
0.0000 [IU] | Freq: Three times a day (TID) | INTRAMUSCULAR | Status: DC
  Administered 2022-12-02: 2 [IU] via SUBCUTANEOUS
  Filled 2022-12-01: qty 1

## 2022-12-01 MED ORDER — ONDANSETRON HCL 4 MG/2ML IJ SOLN: 4.0000 mg | Freq: Four times a day (QID) | INTRAMUSCULAR | Status: DC | PRN

## 2022-12-01 MED ORDER — LORAZEPAM 0.5 MG PO TABS: 0.5000 mg | ORAL_TABLET | Freq: Four times a day (QID) | ORAL | Status: DC | PRN

## 2022-12-01 MED ORDER — ONDANSETRON HCL 4 MG PO TABS: 4.0000 mg | ORAL_TABLET | Freq: Four times a day (QID) | ORAL | Status: DC | PRN

## 2022-12-01 MED ORDER — OXYCODONE-ACETAMINOPHEN 5-325 MG PO TABS
1.0000 | ORAL_TABLET | Freq: Four times a day (QID) | ORAL | Status: AC | PRN
  Administered 2022-12-01: 1 via ORAL

## 2022-12-01 MED ORDER — ACETAMINOPHEN 325 MG PO TABS
650.0000 mg | ORAL_TABLET | Freq: Four times a day (QID) | ORAL | Status: DC | PRN
  Administered 2022-12-02 (×2): 650 mg via ORAL
  Filled 2022-12-01 (×2): qty 2

## 2022-12-01 MED ORDER — OXYCODONE-ACETAMINOPHEN 5-325 MG PO TABS
1.0000 | ORAL_TABLET | Freq: Once | ORAL | Status: DC
  Filled 2022-12-01: qty 1

## 2022-12-01 MED ORDER — ACETAMINOPHEN 650 MG RE SUPP: 650.0000 mg | Freq: Four times a day (QID) | RECTAL | Status: DC | PRN

## 2022-12-01 MED ORDER — NICOTINE 21 MG/24HR TD PT24: 21.0000 mg | MEDICATED_PATCH | Freq: Every day | TRANSDERMAL | Status: DC | PRN

## 2022-12-01 NOTE — Assessment & Plan Note (Addendum)
As needed nicotine patch ordered Patient is not ready to quit smoking

## 2022-12-01 NOTE — Consult Note (Signed)
Consulting Department:  Emergency Dept  Primary Physician:  Barbette Reichmann, MD  Chief Complaint: Lumbar compression fracture with epidural hematoma  History of Present Illness: 12/01/2022 Andrea Rogers is a 62 y.o. female who presents with the chief complaint of fall while walking her dog.  She developed significant back pain.  As part of her workup she is found to have a lumbar compression fracture.  There is some concern for a epidural hematoma so MRI was performed which demonstrated an epidural hematoma.  She is complaining only of Low back pain.  No radiation to her legs.  No new numbness tingling or weakness.  No new bowel or bladder incontinence.  No new changes to her sensation in her lower extremities or perineum.  She states that certain movements or strains will cause the back pain to worsen.  She is more comfortable when she is lying still.  The symptoms are causing a significant impact on the patient's life.   Review of Systems:  A 10 point review of systems is negative, except for the pertinent positives and negatives detailed in the HPI.  Past Medical History: Past Medical History:  Diagnosis Date   Anal condyloma    Arthritis    Bronchitis    Diabetes mellitus without complication (HCC)    GERD (gastroesophageal reflux disease)    Headache    HLD (hyperlipidemia)    Hypertension    Hypothyroidism    MD (muscular dystrophy) (HCC)    Menopause    Muscular dystrophy, limb girdle (HCC)    Osteopenia    Recurrent UTI    Stroke (HCC) 02/2019   TIA   Thyroid disease    Tobacco abuse     Past Surgical History: Past Surgical History:  Procedure Laterality Date   COLONOSCOPY WITH PROPOFOL N/A 04/25/2018   Procedure: COLONOSCOPY WITH PROPOFOL;  Surgeon: Christena Deem, MD;  Location[facility] ENDOSCOPY;  Service: Endoscopy;  Laterality: N/A;   ENDARTERECTOMY Right 08/11/2019   Procedure: ENDARTERECTOMY CAROTID;  Surgeon: Renford Dills, MD;  Location: [facility] ORS;   Service: Vascular;  Laterality: Right;   NO PAST SURGERIES      Allergies: Allergies as of 12/01/2022 - Review Complete 12/01/2022  Allergen Reaction Noted   Latex Rash 04/09/2015   Penicillins Other (See Comments) 10/26/2013    Medications:  Current Facility-Administered Medications:    acetaminophen (TYLENOL) tablet 650 mg, 650 mg, Oral, Q6H PRN **OR** acetaminophen (TYLENOL) suppository 650 mg, 650 mg, Rectal, Q6H PRN, Cox, Amy N, DO   [START ON 12/02/2022] insulin aspart (novoLOG) injection 0-15 Units, 0-15 Units, Subcutaneous, TID WC, Cox, Amy N, DO   insulin aspart (novoLOG) injection 0-5 Units, 0-5 Units, Subcutaneous, QHS, Cox, Amy N, DO   [START ON 12/02/2022] levothyroxine (SYNTHROID) tablet 100 mcg, 100 mcg, Oral, QAC breakfast, Cox, Amy N, DO   LORazepam (ATIVAN) tablet 0.5 mg, 0.5 mg, Oral, Q6H PRN, Cox, Amy N, DO   nicotine (NICODERM CQ - dosed in mg/24 hours) patch 21 mg, 21 mg, Transdermal, Daily PRN, Cox, Amy N, DO   ondansetron (ZOFRAN) tablet 4 mg, 4 mg, Oral, Q6H PRN **OR** ondansetron (ZOFRAN) injection 4 mg, 4 mg, Intravenous, Q6H PRN, Cox, Amy N, DO   oxyCODONE-acetaminophen (PERCOCET/ROXICET) 5-325 MG per tablet 1 tablet, 1 tablet, Oral, Q6H PRN, Cox, Amy N, DO, 1 tablet at 12/01/22 2107   senna-docusate (Senokot-S) tablet 1 tablet, 1 tablet, Oral, QHS PRN, Cox, Amy N, DO   Social History: Social History   Tobacco  Use   Smoking status: Every Day    Packs/day: 1    Types: Cigarettes   Smokeless tobacco: Never  Vaping Use   Vaping Use: Never used  Substance Use Topics   Alcohol use: No   Drug use: No    Family Medical History: Family History  Adopted: Yes  Problem Relation Age of Onset   Breast cancer Mother    Colon cancer Father    Diabetes Father     Physical Examination: Vitals:   12/01/22 1346 12/01/22 2159  BP: 117/64 (!) 163/88  Pulse: 60 63  Resp: 18 15  Temp: 98.4 F (36.9 C) 98.2 F (36.8 C)  SpO2: 95% 96%      General: Patient is calm, collected, and in no apparent distress.  NEUROLOGICAL:  General: In no acute distress.   Awake, alert, oriented to person, place, and time.    She has pain to palpation in the midline low back  Strength:  Side Iliopsoas Quads Hamstring PF DF EHL  R 5 5 5 5 5 5   L 5 5 5 5 5 5     Bilateral upper and lower extremity sensation is intact to light touch.  Reflexes are 2+ and symmetric at the biceps, triceps, brachioradialis, patella and achilles. Hoffman's is absent.  Clonus is not present.   Imaging: Narrative & Impression  CLINICAL DATA:  Back trauma, no prior imaging (Age >= 16y); Head trauma, intracranial venous injury suspected; Neck trauma, dangerous injury mechanism (Age 14-64y)   EXAM: CT THORACIC SPINE WITHOUT CONTRAST   CT HEAD WITHOUT CONTRAST   CT LUMBAR SPINE WITHOUT CONTRAST   CT CERVICAL SPINE WITHOUT CONTRAST   TECHNIQUE: Multidetector CT images of the head, cervical spine, thoracic spine, and lumbar spine were obtained using the standard protocol without intravenous contrast.   RADIATION DOSE REDUCTION: This exam was performed according to the departmental dose-optimization program which includes automated exposure control, adjustment of the mA and/or kV according to patient size and/or use of iterative reconstruction technique.   COMPARISON:  None Available.   FINDINGS: CT HEAD FINDINGS:   Brain: No evidence of acute infarction, hemorrhage, hydrocephalus, extra-axial collection or mass lesion/mass effect. There is sequela of moderate chronic microvascular ischemic change.   Vascular: No hyperdense vessel or unexpected calcification.   Skull: Normal. Negative for fracture or focal lesion.   Sinuses/Orbits: No middle ear or mastoid effusion. Paranasal sinuses are clear. Orbits are unremarkable.   Other: None.   CT CERVICAL SPINE FINDINGS   Alignment: Straightening of the normal cervical lordosis.   Skull  base and vertebrae: No acute fracture. No primary bone lesion or focal pathologic process.   Soft tissues and spinal canal: No prevertebral fluid or swelling. No visible canal hematoma.   Disc levels:  None   Upper chest: Negative.   CT THORACIC SPINE FINDINGS   Alignment: Normal.   Vertebrae: No acute fracture or focal pathologic process. Chronic superior endplate compression deformity at T12.   Paraspinal and other soft tissues: Dependent atelectasis   Disc levels: There is nonspecific layering hyperdense material in the spinal canal (series 5, image 66). This may be artifactual, but could also represent a small amount epidural blood products.   CT LUMBAR SPINE FINDINGS   Segmentation: 5 lumbar type vertebrae.   Alignment: Normal.   Vertebrae: There is an acute superior endplate compression deformity at L1.   Paraspinal and other soft tissues: Aortic atherosclerotic calcifications. Diverticulosis without diverticulitis.   Disc levels: No evidence of  high-grade spinal canal stenosis.   IMPRESSION: 1. No CT evidence of intracranial injury 2. No acute fracture or traumatic subluxation of the cervical spine. 3. Acute superior endplate compression deformity at L1. 4. Nonspecific layering hyperdense material in the spinal canal at the level of T12. This may be artifactual or represent nonspecific dural thickening. Recommend further evaluation with MRI of the thoracic spine to exclude the possibility of epidural blood products.   Aortic Atherosclerosis (ICD10-I70.0).     Electronically Signed   By: Lorenza Cambridge M.D.   On: 12/01/2022 16:50   Narrative & Impression  CLINICAL DATA:  Compression fracture, thoracic; Compression fracture, lumbar   EXAM: MRI THORACIC AND LUMBAR SPINE WITHOUT CONTRAST   TECHNIQUE: Multiplanar and multiecho pulse sequences of the thoracic and lumbar spine were obtained without intravenous contrast.   COMPARISON:  None Available.    FINDINGS: MRI THORACIC SPINE FINDINGS   Alignment:  No substantial sagittal subluxation.   Vertebrae: Remote T12 compression fracture with approximately 20% height loss. No associated bone marrow edema at this level.   Cord:  Normal cord signal.   Paraspinal and other soft tissues: Paraspinal muscular atrophy.   Disc levels:   The ventral epidural hematoma described in the lumbar findings section extends superiorly to the T11 level and contributes to mild canal stenosis at T11-T12 and T12-L1. Otherwise, in the thoracic spine there is no significant canal or foraminal stenosis.   MRI LUMBAR SPINE FINDINGS   Segmentation: Normal.   Alignment:  No substantial sagittal subluxation.   Vertebrae: Acute L1 compression fracture with approximately 10% height loss.   Conus medullaris and cauda equina: Conus extends to the L1-L2 level. Conus and cauda equina appear normal.   Paraspinal and other soft tissues:  Paraspinal muscular atrophy.   Disc levels:   Ventral epidural heterogeneous hematoma which extends from T11 inferiorly to L2 and measures up to 8 mm in thickness. Resulting moderate canal stenosis at the L1 level and mild canal stenosis at the L1-L2 and T11-T12 level.   At L4-L5 and L5-S1 there is disc bulging and facet arthropathy which results in moderate left foraminal stenosis at L5-S1. Otherwise, degenerative changes do not contribute to significant stenosis.   IMPRESSION: MR THORACIC SPINE IMPRESSION   Remote T12 compression fracture with approximately 20% height loss.   MR LUMBAR SPINE IMPRESSION   1. Acute L1 compression fracture with approximately 10% height loss and suspected involvement of the pedicles. 2. Traumatic ventral epidural hematoma which extends from T11 inferiorly to L2 and measures up to 8 mm in thickness. Resulting moderate canal stenosis at the L1 level and mild canal stenosis at the L1-L2 and T11-T12 levels. 3. Superimposed degenerative  changes with moderate left foraminal stenosis at L5-S1.     Electronically Signed   By: Feliberto Harts M.D.   On: 12/01/2022 20:10        I have personally reviewed the images and agree with the above interpretation.  Labs:    Latest Ref Rng & Units 12/01/2022    9:05 PM 08/12/2019    4:26 AM 02/17/2019    5:18 PM  CBC  WBC 4.0 - 10.5 K/uL 10.4  10.4  10.6   Hemoglobin 12.0 - 15.0 g/dL 16.1  09.6  04.5   Hematocrit 36.0 - 46.0 % 45.5  33.9  47.4   Platelets 150 - 400 K/uL 251  227  309        Assessment and Plan: Ms. Miltenberger is a pleasant 62  y.o. female with a recent fall while walking her dogs.  She states that she had significant back pain.  She came in for evaluation and was found to have a L1 compression fracture that was acute.  Given the findings on the CT scan a MRI was requested.  MRI demonstrated a epidural hematoma.  She denies any numbness weakness or tingling.  Is not having any trouble with urinary bowel retention or incontinence.  No perineal sensation loss.  Feels that she is full strength without any acute changes.  Only feels that she has significant pain in her back.  TLSO for comfort.  Recommend observation given the epidural hematoma.  Will evaluate again in the morning to make sure she has not developed any symptoms from the epidural portion of the injury.  At this point no scheduled imaging necessary, please call if any changes in neurologic status.  I have discussed the condition with the patient, including showing the radiographs and discussing treatment options in layman's terms.      Lovenia Kim, MD/MSCR Dept. of Neurosurgery

## 2022-12-01 NOTE — H&P (Signed)
History and Physical   Andrea Rogers:[id number] DOB: January 09, 1961 DOA: 12/01/2022  PCP: Barbette Reichmann, MD  Outpatient Specialists: Dr. Gilda Crease, vascular Patient coming from: home  I have personally briefly reviewed patient's old medical records in [facility] EMR.  Chief Concern: fall, low back pain  HPI: [name] is a 62 year old female with history of tobacco use, non-insulin-dependent diabetes mellitus, generalized anxiety disorder, limb-girdle muscular dystrophy, primary osteoarthritis of both knees, acquired hypothyroid, hypertension, arthritis of the hands, who presents to the emergency department for chief concerns of falling after being caught in her dog's leash.  She endorses increased pain to her right lower back.  Vitals in the ED showed temperature of 98.4, respiration rate of 18, heart rate of 60, blood pressure of 117/64, SpO2 95% on room air.  Serum sodium 133, potassium 4.3, chloride 97, bicarb 27, BUN of 24, serum creatinine 0.68, nonfasting blood glucose 106, eGFR greater than 60, WBC 10.4, hemoglobin 15.2, platelets of 251.  Lipase was 32.  ED treatment: Ondansetron 4 mg p.o. one-time dose, oxycodone-acetaminophen 5-325 mg p.o. one-time dose, Ativan 0.5 mg p.o. one-time dose. --------------------------- At bedside, patient was able to tell me his name, age, current location, current calendar year.   She was attempting to [facility] her dog. He is normally leased to a weight in the den because he runs all over the home and patient and her husband was afraid that patient would trip on the dog.   She denies numbness and tingling of her lower extremities.  Social history: She lives with her husband. She smokes tobacco products, 1 ppd. She denies etoh and recreational drug use.   ROS: Constitutional: no weight change, no fever ENT/Mouth: no sore throat, no rhinorrhea Eyes: no eye pain, no vision changes Cardiovascular: no chest pain, no dyspnea,  no edema, no  palpitations Respiratory: no cough, no sputum, no wheezing Gastrointestinal: no nausea, no vomiting, no diarrhea, no constipation Genitourinary: no urinary incontinence, no dysuria, no hematuria Musculoskeletal: no arthralgias, no myalgias Skin: no skin lesions, no pruritus, Neuro: + weakness, no loss of consciousness, no syncope Psych: no anxiety, no depression, no decrease appetite Heme/Lymph: no bruising, no bleeding  ED Course: Discussed with emergency medicine provider, patient requiring hospitalization for chief concerns of neurologic/urinary monitoring in setting of superior endplate fracture with ventral hematoma.  Assessment/Plan  Principal Problem:   Traumatic epidural hematoma (HCC) Active Problems:   Tobacco abuse   Acquired hypothyroidism   Diabetes mellitus type 2, controlled (HCC)   Essential hypertension   GAD (generalized anxiety disorder)   Limb-girdle muscular dystrophy (HCC)   HLD (hyperlipidemia)   Primary osteoarthritis of both knees   RLS (restless legs syndrome)   Statin intolerance   Assessment and Plan:  * Traumatic epidural hematoma (HCC) Superior endplate fracture with a ventral hematoma secondary to traumatic fall Neurosurgery has been consulted, we appreciate further recommendations and guidance Close monitoring for urinary function Patient is status post bladder scan which per EDP had 200 mL of urine Strict I's and O's Fall precautions Admit to telemetry medical, observation  Limb-girdle muscular dystrophy (HCC) Fall precautions  GAD (generalized anxiety disorder) Ativan 0.5 mg p.o. every 6 hours as needed for anxiety, 15 hours of coverage ordered  Diabetes mellitus type 2, controlled (HCC) Non-insulin-dependent diabetes mellitus Home glipizide and metformin were not resumed on admission Insulin SSI with at bedtime coverage ordered Goal inpatient blood glucose level 140-180  Tobacco abuse As needed nicotine patch ordered Patient is  not  ready to quit smoking  Chart reviewed.   DVT prophylaxis: TED hose; AM team to initiate pharmacologic DVT prophylaxis when the benefits outweigh the risk Code Status: full code Diet: Heart healthy/carb modified Family Communication: a phone call was offered, patient declined, stating her husband already knows she is in the hospital.  Disposition Plan: Pending neurosurgery recommendation and patient clinical course Consults called: Neurosurgery Admission status: Telemetry medical, observation  Past Medical History:  Diagnosis Date   Anal condyloma    Arthritis    Bronchitis    Diabetes mellitus without complication (HCC)    GERD (gastroesophageal reflux disease)    Headache    HLD (hyperlipidemia)    Hypertension    Hypothyroidism    MD (muscular dystrophy) (HCC)    Menopause    Muscular dystrophy, limb girdle (HCC)    Osteopenia    Recurrent UTI    Stroke (HCC) 02/2019   TIA   Thyroid disease    Tobacco abuse    Past Surgical History:  Procedure Laterality Date   COLONOSCOPY WITH PROPOFOL N/A 04/25/2018   Procedure: COLONOSCOPY WITH PROPOFOL;  Surgeon: Christena Deem, MD;  Location: [facility] ENDOSCOPY;  Service: Endoscopy;  Laterality: N/A;   ENDARTERECTOMY Right 08/11/2019   Procedure: ENDARTERECTOMY CAROTID;  Surgeon: Renford Dills, MD;  Location: [facility] ORS;  Service: Vascular;  Laterality: Right;   NO PAST SURGERIES     Social History:  reports that she has been smoking cigarettes. She has been smoking an average of 1 pack per day. She has never used smokeless tobacco. She reports that she does not drink alcohol and does not use drugs.  Allergies  Allergen Reactions   Latex Rash   Penicillins Other (See Comments)    Yeast infection Did it involve swelling of the face/tongue/throat, SOB, or low BP? No Did it involve sudden or severe rash/hives, skin peeling, or any reaction on the inside of your mouth or nose? No Did you need to seek medical attention at a  hospital or doctor's office? No When did it last happen?       If all above answers are "NO", may proceed with cephalosporin use.    Family History  Adopted: Yes  Problem Relation Age of Onset   Breast cancer Mother    Colon cancer Father    Diabetes Father    Family history: Family history reviewed and not pertinent.  Prior to Admission medications   Medication Sig Start Date End Date Taking? Authorizing Provider  albuterol (VENTOLIN HFA) 108 (90 Base) MCG/ACT inhaler Inhale 2 puffs into the lungs every 6 (six) hours as needed for wheezing or shortness of breath.     [provider]  ALPRAZolam Prudy Feeler) 0.25 MG tablet Take 0.25 mg by mouth at bedtime as needed for anxiety or sleep.     [provider]  ALPRAZolam Prudy Feeler) 0.5 MG tablet Take 0.5 mg by mouth at bedtime as needed. 12/25/19   [provider]  ALPRAZolam Prudy Feeler) 0.5 MG tablet Take by mouth. 09/02/20   [provider]  aspirin EC 81 MG tablet Take 81 mg by mouth daily.    [provider]  atenolol (TENORMIN) 100 MG tablet Take 200 mg by mouth daily.     [provider]  ezetimibe (ZETIA) 10 MG tablet Take 10 mg by mouth at bedtime.     [provider]  ferrous sulfate 325 (65 FE) MG EC tablet Take 325 mg by mouth 3 (three) times daily  with meals.    [provider]  ferrous sulfate 325 (65 FE) MG tablet Take by mouth. 10/17/19 12/04/20  [provider]  glimepiride (AMARYL) 1 MG tablet Take 1 mg by mouth daily.     [provider]  HYDROcodone-acetaminophen (NORCO) 7.5-325 MG tablet Take 1 tablet by mouth every 6 (six) hours as needed for moderate pain. Takes mostly at night    [provider]  levothyroxine (SYNTHROID) 100 MCG tablet Take 100 mcg by mouth daily before breakfast.    [provider]  meclizine (ANTIVERT) 25 MG tablet Take 25 mg by mouth 3 (three) times daily as needed. Patient not taking: Reported on 08/18/2021  12/10/20   [provider]  metFORMIN (GLUCOPHAGE) 500 MG tablet Take 1,000 mg by mouth 2 (two) times daily with a meal.     [provider]  metFORMIN (GLUCOPHAGE) 500 MG tablet Take 2 tablets by mouth 2 (two) times daily. Patient not taking: Reported on 08/18/2021 09/18/20   [provider]  omeprazole (PRILOSEC) 40 MG capsule Take 40 mg by mouth daily. 02/06/19   [provider]  omeprazole (PRILOSEC) 40 MG capsule Take 1 tablet by mouth daily. Patient not taking: Reported on 08/18/2021 05/06/20   [provider]  Prasterone (INTRAROSA) 6.5 MG INST Place 1 tablet vaginally at bedtime. Patient not taking: Reported on 08/18/2021 12/04/20   Hildred Laser, MD  triamterene-hydrochlorothiazide (MAXZIDE) 75-50 MG tablet Take 1 tablet by mouth daily.     [provider]   Physical Exam: Vitals:   12/01/22 1346 12/01/22 2159  BP: 117/64 (!) 163/88  Pulse: 60 63  Resp: 18 15  Temp: 98.4 F (36.9 C) 98.2 F (36.8 C)  SpO2: 95% 96%  Weight: 54 kg 52.4 kg  Height: 5\' 4"  (1.626 m) 5\' 4"  (1.626 m)   Constitutional: appears age appropriate, NAD , calm Eyes: PERRL, lids and conjunctivae normal ENMT: Mucous membranes are moist. Posterior pharynx clear of any exudate or lesions. Age-appropriate dentition. Hearing appropriate Neck: normal, supple, no masses, no thyromegaly Respiratory: clear to auscultation bilaterally, no wheezing, no crackles. Normal respiratory effort. No accessory muscle use.  Cardiovascular: Regular rate and rhythm, no murmurs / rubs / gallops. No extremity edema. 2+ pedal pulses. No carotid bruits.  Abdomen: no tenderness, no masses palpated, no hepatosplenomegaly. Bowel sounds positive.  Musculoskeletal: + back pain, no clubbing / cyanosis. No joint deformity upper and lower extremities. Good ROM, no contractures, no atrophy. Normal muscle tone.  Skin: no rashes, lesions, ulcers. No induration Neurologic: Sensation intact.  Strength 5/5 in all 4.  Psychiatric: Normal judgment and insight. Alert and oriented x 3. Normal mood.   EKG: Not indicated at this time  Chest x-ray on Admission: Not indicated at this time  MR THORACIC SPINE WO CONTRAST  Result Date: 12/01/2022 CLINICAL DATA:  Compression fracture, thoracic; Compression fracture, lumbar EXAM: MRI THORACIC AND LUMBAR SPINE WITHOUT CONTRAST TECHNIQUE: Multiplanar and multiecho pulse sequences of the thoracic and lumbar spine were obtained without intravenous contrast. COMPARISON:  None Available. FINDINGS: MRI THORACIC SPINE FINDINGS Alignment:  No substantial sagittal subluxation. Vertebrae: Remote T12 compression fracture with approximately 20% height loss. No associated bone marrow edema at this level. Cord:  Normal cord signal. Paraspinal and other soft tissues: Paraspinal muscular atrophy. Disc levels: The ventral epidural hematoma described in the lumbar findings section extends superiorly to the T11 level and contributes to mild canal stenosis at T11-T12 and T12-L1. Otherwise, in the thoracic spine there  is no significant canal or foraminal stenosis. MRI LUMBAR SPINE FINDINGS Segmentation: Normal. Alignment:  No substantial sagittal subluxation. Vertebrae: Acute L1 compression fracture with approximately 10% height loss. Conus medullaris and cauda equina: Conus extends to the L1-L2 level. Conus and cauda equina appear normal. Paraspinal and other soft tissues:  Paraspinal muscular atrophy. Disc levels: Ventral epidural heterogeneous hematoma which extends from T11 inferiorly to L2 and measures up to 8 mm in thickness. Resulting moderate canal stenosis at the L1 level and mild canal stenosis at the L1-L2 and T11-T12 level. At L4-L5 and L5-S1 there is disc bulging and facet arthropathy which results in moderate left foraminal stenosis at L5-S1. Otherwise, degenerative changes do not contribute to significant stenosis. IMPRESSION: MR THORACIC SPINE IMPRESSION Remote T12  compression fracture with approximately 20% height loss. MR LUMBAR SPINE IMPRESSION 1. Acute L1 compression fracture with approximately 10% height loss and suspected involvement of the pedicles. 2. Traumatic ventral epidural hematoma which extends from T11 inferiorly to L2 and measures up to 8 mm in thickness. Resulting moderate canal stenosis at the L1 level and mild canal stenosis at the L1-L2 and T11-T12 levels. 3. Superimposed degenerative changes with moderate left foraminal stenosis at L5-S1. Electronically Signed   By: Feliberto Harts M.D.   On: 12/01/2022 20:10   MR LUMBAR SPINE WO CONTRAST  Result Date: 12/01/2022 CLINICAL DATA:  Compression fracture, thoracic; Compression fracture, lumbar EXAM: MRI THORACIC AND LUMBAR SPINE WITHOUT CONTRAST TECHNIQUE: Multiplanar and multiecho pulse sequences of the thoracic and lumbar spine were obtained without intravenous contrast. COMPARISON:  None Available. FINDINGS: MRI THORACIC SPINE FINDINGS Alignment:  No substantial sagittal subluxation. Vertebrae: Remote T12 compression fracture with approximately 20% height loss. No associated bone marrow edema at this level. Cord:  Normal cord signal. Paraspinal and other soft tissues: Paraspinal muscular atrophy. Disc levels: The ventral epidural hematoma described in the lumbar findings section extends superiorly to the T11 level and contributes to mild canal stenosis at T11-T12 and T12-L1. Otherwise, in the thoracic spine there is no significant canal or foraminal stenosis. MRI LUMBAR SPINE FINDINGS Segmentation: Normal. Alignment:  No substantial sagittal subluxation. Vertebrae: Acute L1 compression fracture with approximately 10% height loss. Conus medullaris and cauda equina: Conus extends to the L1-L2 level. Conus and cauda equina appear normal. Paraspinal and other soft tissues:  Paraspinal muscular atrophy. Disc levels: Ventral epidural heterogeneous hematoma which extends from T11 inferiorly to L2 and measures  up to 8 mm in thickness. Resulting moderate canal stenosis at the L1 level and mild canal stenosis at the L1-L2 and T11-T12 level. At L4-L5 and L5-S1 there is disc bulging and facet arthropathy which results in moderate left foraminal stenosis at L5-S1. Otherwise, degenerative changes do not contribute to significant stenosis. IMPRESSION: MR THORACIC SPINE IMPRESSION Remote T12 compression fracture with approximately 20% height loss. MR LUMBAR SPINE IMPRESSION 1. Acute L1 compression fracture with approximately 10% height loss and suspected involvement of the pedicles. 2. Traumatic ventral epidural hematoma which extends from T11 inferiorly to L2 and measures up to 8 mm in thickness. Resulting moderate canal stenosis at the L1 level and mild canal stenosis at the L1-L2 and T11-T12 levels. 3. Superimposed degenerative changes with moderate left foraminal stenosis at L5-S1. Electronically Signed   By: Feliberto Harts M.D.   On: 12/01/2022 20:10   CT PELVIS WO CONTRAST  Result Date: 12/01/2022 CLINICAL DATA:  Hip trauma with fracture suspected EXAM: CT PELVIS WITHOUT CONTRAST TECHNIQUE: Multidetector CT imaging of the pelvis was performed following  the standard protocol without intravenous contrast. RADIATION DOSE REDUCTION: This exam was performed according to the departmental dose-optimization program which includes automated exposure control, adjustment of the mA and/or kV according to patient size and/or use of iterative reconstruction technique. COMPARISON:  None Available. FINDINGS: Negative for hip fracture or dislocation. Negative for pelvic ring fracture or diastasis. Profound fatty atrophy of muscles diffusely. There is chart history of muscular dystrophy. IMPRESSION: 1. No acute finding.  No hip fracture or dislocation. 2. Marked muscular atrophy in keeping with history of muscular dystrophy. Electronically Signed   By: Tiburcio Pea M.D.   On: 12/01/2022 17:15   CT Cervical Spine Wo  Contrast  Result Date: 12/01/2022 CLINICAL DATA:  Back trauma, no prior imaging (Age >= 16y); Head trauma, intracranial venous injury suspected; Neck trauma, dangerous injury mechanism (Age 70-64y) EXAM: CT THORACIC SPINE WITHOUT CONTRAST CT HEAD WITHOUT CONTRAST CT LUMBAR SPINE WITHOUT CONTRAST CT CERVICAL SPINE WITHOUT CONTRAST TECHNIQUE: Multidetector CT images of the head, cervical spine, thoracic spine, and lumbar spine were obtained using the standard protocol without intravenous contrast. RADIATION DOSE REDUCTION: This exam was performed according to the departmental dose-optimization program which includes automated exposure control, adjustment of the mA and/or kV according to patient size and/or use of iterative reconstruction technique. COMPARISON:  None Available. FINDINGS: CT HEAD FINDINGS: Brain: No evidence of acute infarction, hemorrhage, hydrocephalus, extra-axial collection or mass lesion/mass effect. There is sequela of moderate chronic microvascular ischemic change. Vascular: No hyperdense vessel or unexpected calcification. Skull: Normal. Negative for fracture or focal lesion. Sinuses/Orbits: No middle ear or mastoid effusion. Paranasal sinuses are clear. Orbits are unremarkable. Other: None. CT CERVICAL SPINE FINDINGS Alignment: Straightening of the normal cervical lordosis. Skull base and vertebrae: No acute fracture. No primary bone lesion or focal pathologic process. Soft tissues and spinal canal: No prevertebral fluid or swelling. No visible canal hematoma. Disc levels:  None Upper chest: Negative. CT THORACIC SPINE FINDINGS Alignment: Normal. Vertebrae: No acute fracture or focal pathologic process. Chronic superior endplate compression deformity at T12. Paraspinal and other soft tissues: Dependent atelectasis Disc levels: There is nonspecific layering hyperdense material in the spinal canal (series 5, image 66). This may be artifactual, but could also represent a small amount epidural  blood products. CT LUMBAR SPINE FINDINGS Segmentation: 5 lumbar type vertebrae. Alignment: Normal. Vertebrae: There is an acute superior endplate compression deformity at L1. Paraspinal and other soft tissues: Aortic atherosclerotic calcifications. Diverticulosis without diverticulitis. Disc levels: No evidence of high-grade spinal canal stenosis. IMPRESSION: 1. No CT evidence of intracranial injury 2. No acute fracture or traumatic subluxation of the cervical spine. 3. Acute superior endplate compression deformity at L1. 4. Nonspecific layering hyperdense material in the spinal canal at the level of T12. This may be artifactual or represent nonspecific dural thickening. Recommend further evaluation with MRI of the thoracic spine to exclude the possibility of epidural blood products. Aortic Atherosclerosis (ICD10-I70.0). Electronically Signed   By: Lorenza Cambridge M.D.   On: 12/01/2022 16:50   CT Head Wo Contrast  Result Date: 12/01/2022 CLINICAL DATA:  Back trauma, no prior imaging (Age >= 16y); Head trauma, intracranial venous injury suspected; Neck trauma, dangerous injury mechanism (Age 78-64y) EXAM: CT THORACIC SPINE WITHOUT CONTRAST CT HEAD WITHOUT CONTRAST CT LUMBAR SPINE WITHOUT CONTRAST CT CERVICAL SPINE WITHOUT CONTRAST TECHNIQUE: Multidetector CT images of the head, cervical spine, thoracic spine, and lumbar spine were obtained using the standard protocol without intravenous contrast. RADIATION DOSE REDUCTION: This exam was performed according to  the departmental dose-optimization program which includes automated exposure control, adjustment of the mA and/or kV according to patient size and/or use of iterative reconstruction technique. COMPARISON:  None Available. FINDINGS: CT HEAD FINDINGS: Brain: No evidence of acute infarction, hemorrhage, hydrocephalus, extra-axial collection or mass lesion/mass effect. There is sequela of moderate chronic microvascular ischemic change. Vascular: No hyperdense vessel  or unexpected calcification. Skull: Normal. Negative for fracture or focal lesion. Sinuses/Orbits: No middle ear or mastoid effusion. Paranasal sinuses are clear. Orbits are unremarkable. Other: None. CT CERVICAL SPINE FINDINGS Alignment: Straightening of the normal cervical lordosis. Skull base and vertebrae: No acute fracture. No primary bone lesion or focal pathologic process. Soft tissues and spinal canal: No prevertebral fluid or swelling. No visible canal hematoma. Disc levels:  None Upper chest: Negative. CT THORACIC SPINE FINDINGS Alignment: Normal. Vertebrae: No acute fracture or focal pathologic process. Chronic superior endplate compression deformity at T12. Paraspinal and other soft tissues: Dependent atelectasis Disc levels: There is nonspecific layering hyperdense material in the spinal canal (series 5, image 66). This may be artifactual, but could also represent a small amount epidural blood products. CT LUMBAR SPINE FINDINGS Segmentation: 5 lumbar type vertebrae. Alignment: Normal. Vertebrae: There is an acute superior endplate compression deformity at L1. Paraspinal and other soft tissues: Aortic atherosclerotic calcifications. Diverticulosis without diverticulitis. Disc levels: No evidence of high-grade spinal canal stenosis. IMPRESSION: 1. No CT evidence of intracranial injury 2. No acute fracture or traumatic subluxation of the cervical spine. 3. Acute superior endplate compression deformity at L1. 4. Nonspecific layering hyperdense material in the spinal canal at the level of T12. This may be artifactual or represent nonspecific dural thickening. Recommend further evaluation with MRI of the thoracic spine to exclude the possibility of epidural blood products. Aortic Atherosclerosis (ICD10-I70.0). Electronically Signed   By: Lorenza Cambridge M.D.   On: 12/01/2022 16:50   CT Lumbar Spine Wo Contrast  Result Date: 12/01/2022 CLINICAL DATA:  Back trauma, no prior imaging (Age >= 16y); Head trauma,  intracranial venous injury suspected; Neck trauma, dangerous injury mechanism (Age 52-64y) EXAM: CT THORACIC SPINE WITHOUT CONTRAST CT HEAD WITHOUT CONTRAST CT LUMBAR SPINE WITHOUT CONTRAST CT CERVICAL SPINE WITHOUT CONTRAST TECHNIQUE: Multidetector CT images of the head, cervical spine, thoracic spine, and lumbar spine were obtained using the standard protocol without intravenous contrast. RADIATION DOSE REDUCTION: This exam was performed according to the departmental dose-optimization program which includes automated exposure control, adjustment of the mA and/or kV according to patient size and/or use of iterative reconstruction technique. COMPARISON:  None Available. FINDINGS: CT HEAD FINDINGS: Brain: No evidence of acute infarction, hemorrhage, hydrocephalus, extra-axial collection or mass lesion/mass effect. There is sequela of moderate chronic microvascular ischemic change. Vascular: No hyperdense vessel or unexpected calcification. Skull: Normal. Negative for fracture or focal lesion. Sinuses/Orbits: No middle ear or mastoid effusion. Paranasal sinuses are clear. Orbits are unremarkable. Other: None. CT CERVICAL SPINE FINDINGS Alignment: Straightening of the normal cervical lordosis. Skull base and vertebrae: No acute fracture. No primary bone lesion or focal pathologic process. Soft tissues and spinal canal: No prevertebral fluid or swelling. No visible canal hematoma. Disc levels:  None Upper chest: Negative. CT THORACIC SPINE FINDINGS Alignment: Normal. Vertebrae: No acute fracture or focal pathologic process. Chronic superior endplate compression deformity at T12. Paraspinal and other soft tissues: Dependent atelectasis Disc levels: There is nonspecific layering hyperdense material in the spinal canal (series 5, image 66). This may be artifactual, but could also represent a small amount epidural blood products. CT  LUMBAR SPINE FINDINGS Segmentation: 5 lumbar type vertebrae. Alignment: Normal. Vertebrae:  There is an acute superior endplate compression deformity at L1. Paraspinal and other soft tissues: Aortic atherosclerotic calcifications. Diverticulosis without diverticulitis. Disc levels: No evidence of high-grade spinal canal stenosis. IMPRESSION: 1. No CT evidence of intracranial injury 2. No acute fracture or traumatic subluxation of the cervical spine. 3. Acute superior endplate compression deformity at L1. 4. Nonspecific layering hyperdense material in the spinal canal at the level of T12. This may be artifactual or represent nonspecific dural thickening. Recommend further evaluation with MRI of the thoracic spine to exclude the possibility of epidural blood products. Aortic Atherosclerosis (ICD10-I70.0). Electronically Signed   By: Lorenza Cambridge M.D.   On: 12/01/2022 16:50   CT Thoracic Spine Wo Contrast  Result Date: 12/01/2022 CLINICAL DATA:  Back trauma, no prior imaging (Age >= 16y); Head trauma, intracranial venous injury suspected; Neck trauma, dangerous injury mechanism (Age 48-64y) EXAM: CT THORACIC SPINE WITHOUT CONTRAST CT HEAD WITHOUT CONTRAST CT LUMBAR SPINE WITHOUT CONTRAST CT CERVICAL SPINE WITHOUT CONTRAST TECHNIQUE: Multidetector CT images of the head, cervical spine, thoracic spine, and lumbar spine were obtained using the standard protocol without intravenous contrast. RADIATION DOSE REDUCTION: This exam was performed according to the departmental dose-optimization program which includes automated exposure control, adjustment of the mA and/or kV according to patient size and/or use of iterative reconstruction technique. COMPARISON:  None Available. FINDINGS: CT HEAD FINDINGS: Brain: No evidence of acute infarction, hemorrhage, hydrocephalus, extra-axial collection or mass lesion/mass effect. There is sequela of moderate chronic microvascular ischemic change. Vascular: No hyperdense vessel or unexpected calcification. Skull: Normal. Negative for fracture or focal lesion. Sinuses/Orbits:  No middle ear or mastoid effusion. Paranasal sinuses are clear. Orbits are unremarkable. Other: None. CT CERVICAL SPINE FINDINGS Alignment: Straightening of the normal cervical lordosis. Skull base and vertebrae: No acute fracture. No primary bone lesion or focal pathologic process. Soft tissues and spinal canal: No prevertebral fluid or swelling. No visible canal hematoma. Disc levels:  None Upper chest: Negative. CT THORACIC SPINE FINDINGS Alignment: Normal. Vertebrae: No acute fracture or focal pathologic process. Chronic superior endplate compression deformity at T12. Paraspinal and other soft tissues: Dependent atelectasis Disc levels: There is nonspecific layering hyperdense material in the spinal canal (series 5, image 66). This may be artifactual, but could also represent a small amount epidural blood products. CT LUMBAR SPINE FINDINGS Segmentation: 5 lumbar type vertebrae. Alignment: Normal. Vertebrae: There is an acute superior endplate compression deformity at L1. Paraspinal and other soft tissues: Aortic atherosclerotic calcifications. Diverticulosis without diverticulitis. Disc levels: No evidence of high-grade spinal canal stenosis. IMPRESSION: 1. No CT evidence of intracranial injury 2. No acute fracture or traumatic subluxation of the cervical spine. 3. Acute superior endplate compression deformity at L1. 4. Nonspecific layering hyperdense material in the spinal canal at the level of T12. This may be artifactual or represent nonspecific dural thickening. Recommend further evaluation with MRI of the thoracic spine to exclude the possibility of epidural blood products. Aortic Atherosclerosis (ICD10-I70.0). Electronically Signed   By: Lorenza Cambridge M.D.   On: 12/01/2022 16:50    Labs on Admission: I have personally reviewed following labs  CBC: Recent Labs  Lab 12/01/22 2105  WBC 10.4  NEUTROABS 7.5  HGB 15.2*  HCT 45.5  MCV 97.4  PLT 251   Basic Metabolic Panel: Recent Labs  Lab  12/01/22 2105  NA 133*  K 4.3  CL 97*  CO2 27  GLUCOSE 106*  BUN 24*  CREATININE 0.68  CALCIUM 9.1   GFR: Estimated Creatinine Clearance: 60.3 mL/min (by C-G formula based on SCr of 0.68 mg/dL). Liver Function Tests: Recent Labs  Lab 12/01/22 2105  AST 18  ALT 20  ALKPHOS 49  BILITOT 0.7  PROT 7.1  ALBUMIN 4.0   Recent Labs  Lab 12/01/22 2105  LIPASE 32   No results for input(s): "AMMONIA" in the last 168 hours. Coagulation Profile: No results for input(s): "INR", "PROTIME" in the last 168 hours. Cardiac Enzymes: No results for input(s): "CKTOTAL", "CKMB", "CKMBINDEX", "TROPONINI" in the last 168 hours. BNP (last 3 results) No results for input(s): "PROBNP" in the last 8760 hours. HbA1C: No results for input(s): "HGBA1C" in the last 72 hours. CBG: Recent Labs  Lab 12/01/22 2108  GLUCAP 109*   Lipid Profile: No results for input(s): "CHOL", "HDL", "LDLCALC", "TRIG", "CHOLHDL", "LDLDIRECT" in the last 72 hours. Thyroid Function Tests: No results for input(s): "TSH", "T4TOTAL", "FREET4", "T3FREE", "THYROIDAB" in the last 72 hours. Anemia Panel: No results for input(s): "VITAMINB12", "FOLATE", "FERRITIN", "TIBC", "IRON", "RETICCTPCT" in the last 72 hours. Urine analysis: No results found for: "COLORURINE", "APPEARANCEUR", "LABSPEC", "PHURINE", "GLUCOSEU", "HGBUR", "BILIRUBINUR", "KETONESUR", "PROTEINUR", "UROBILINOGEN", "NITRITE", "LEUKOCYTESUR"  This document was prepared using Dragon Voice Recognition software and may include unintentional dictation errors.  Dr. Sedalia Muta [facility]  If 7PM-7AM, please contact overnight-coverage provider If 7AM-7PM, please contact day attending provider [email or web address]  12/01/2022, 10:22 PM

## 2022-12-01 NOTE — ED Provider Notes (Signed)
[facility] Provider Note  Patient Contact: 3:45 PM (approximate)   History   Fall   HPI  Andrea Rogers is a 62 y.o. female presents to the emergency department after patient had a mechanical fall.  Patient reports that her dog's leash wrapped around her legs and caused her to trip.  She is not on a daily blood thinner but does take aspirin daily.  She denies hitting her head but states that she is having some neck discomfort.  She is primarily localizing pain to the low back and sacrum.  She has been able to ambulate with small steps since injury occurred.  No abrasions or lacerations.  No chest pain, chest tightness or abdominal pain.      Physical Exam   Triage Vital Signs: ED Triage Vitals [12/01/22 1346]  Enc Vitals Group     BP 117/64     Pulse Rate 60     Resp 18     Temp 98.4 F (36.9 C)     Temp src      SpO2 95 %     Weight 119 lb (54 kg)     Height 5\' 4"  (1.626 m)     Head Circumference      Peak Flow      Pain Score 9     Pain Loc      Pain Edu?      Excl. in GC?     Most recent vital signs: Vitals:   12/01/22 1346  BP: 117/64  Pulse: 60  Resp: 18  Temp: 98.4 F (36.9 C)  SpO2: 95%     General: Alert and in no acute distress. Eyes:  PERRL. EOMI. Head: No acute traumatic findings ENT:      Nose: No congestion/rhinnorhea.      Mouth/Throat: Mucous membranes are moist. Neck: No stridor. No cervical spine tenderness to palpation. Cardiovascular:  Good peripheral perfusion Respiratory: Normal respiratory effort without tachypnea or retractions. Lungs CTAB. Good air entry to the bases with no decreased or absent breath sounds. Gastrointestinal: Bowel sounds 4 quadrants. Soft and nontender to palpation. No guarding or rigidity. No palpable masses. No distention. No CVA tenderness. Musculoskeletal: Full range of motion to all extremities. Patient has midline lumbar spine tenderness to palpation.  Neurologic:  No gross focal  neurologic deficits are appreciated.  Skin:   No rash noted   ED Results / Procedures / Treatments   Labs (all labs ordered are listed, but only abnormal results are displayed) Labs Reviewed  CBG MONITORING, ED - Abnormal; Notable for the following components:      Result Value   Glucose-Capillary 109 (*)    All other components within normal limits  CBC WITH DIFFERENTIAL/PLATELET  COMPREHENSIVE METABOLIC PANEL  LIPASE, BLOOD  BASIC METABOLIC PANEL  CBC  HEMOGLOBIN A1C       RADIOLOGY  I personally viewed and evaluated these images as part of my medical decision making, as well as reviewing the written report by the radiologist.  ED Provider Interpretation: MRIs of the lumbar and thoracic spine confirm superior endplate fracture of L1 and traumatic ventral epidural hematoma.   PROCEDURES:  Critical Care performed: No  Procedures   MEDICATIONS ORDERED IN ED: Medications  acetaminophen (TYLENOL) tablet 650 mg (has no administration in time range)    Or  acetaminophen (TYLENOL) suppository 650 mg (has no administration in time range)  ondansetron (ZOFRAN) tablet 4 mg (has no administration in time range)  Or  ondansetron (ZOFRAN) injection 4 mg (has no administration in time range)  senna-docusate (Senokot-S) tablet 1 tablet (has no administration in time range)  nicotine (NICODERM CQ - dosed in mg/24 hours) patch 21 mg (has no administration in time range)  oxyCODONE-acetaminophen (PERCOCET/ROXICET) 5-325 MG per tablet 1 tablet (1 tablet Oral Given 12/01/22 2107)  LORazepam (ATIVAN) tablet 0.5 mg (has no administration in time range)  levothyroxine (SYNTHROID) tablet 100 mcg (has no administration in time range)  insulin aspart (novoLOG) injection 0-15 Units (has no administration in time range)  insulin aspart (novoLOG) injection 0-5 Units ( Subcutaneous Not Given 12/01/22 2109)  oxyCODONE-acetaminophen (PERCOCET/ROXICET) 5-325 MG per tablet 1 tablet (1 tablet Oral  Given 12/01/22 1540)  ondansetron (ZOFRAN-ODT) disintegrating tablet 4 mg (4 mg Oral Given 12/01/22 1540)  LORazepam (ATIVAN) tablet 0.5 mg (0.5 mg Oral Given 12/01/22 1817)  ondansetron (ZOFRAN-ODT) disintegrating tablet 4 mg (4 mg Oral Given 12/01/22 2107)     IMPRESSION / MDM / ASSESSMENT AND PLAN / ED COURSE  I reviewed the triage vital signs and the nursing notes.                              Assessment and plan Fall 62 year old female presents to the emergency department after patient had a mechanical fall earlier today.  Patient became wrapped up in her dog's leash.  Vital signs were reassuring at triage.  On exam, patient was alert, active and nontoxic-appearing.  Differential diagnosis includes contusion versus fracture.  Will obtain CTs of the head, cervical spine, thoracic spine, lumbar spine and pelvis and will reassess.  Percocet was given for pain.  CTs of the head and cervical spine were reassuring without evidence of skull fracture, intracranial bleed, C-spine fracture or hip fracture.  CT of the thoracic spine suggested a possible epidural hematoma CT of the lumbar spine suggested a superior endplate fracture with approximately 10% height loss.  Radiologist recommended confirmation with MRI in terms of epidural hematoma.  Epidural hematoma was confirmed on MRI of the thoracic spine.  I reviewed patient case with neurosurgeon on-call, Dr. Ernestine Mcmurray.  Very much appreciate consult.  Dr. Katrinka Blazing is going to evaluate patient at bedside tonight to establish a baseline exam.  I did offer patient a TLSO brace for comfort and she declined it at this time.  Dr. Katrinka Blazing personally evaluated MRI of the thoracic spine and feels epidural hematoma is larger than documented radiologist read.  He did recommend bladder scan which was 230 cc after patient urinated.  Patient was able to stand and ambulate to restroom.  Dr. Katrinka Blazing recommended admitting patient overnight to observe for possible urinary  incontinence or retention.  Patient was accepted to the hospitalist service under the care of of Dr. Londell Moh.   FINAL CLINICAL IMPRESSION(S) / ED DIAGNOSES   Final diagnoses:  Epidural hematoma (HCC)  Compression fracture of L1 vertebra, initial encounter (HCC)     Rx / DC Orders   ED Discharge Orders     None        Note:  This document was prepared using Dragon voice recognition software and may include unintentional dictation errors.   Pia Mau [place], Cordelia Poche 12/01/22 2137    Pilar Jarvis, MD 12/02/22 [phone]

## 2022-12-01 NOTE — ED Triage Notes (Signed)
Pt to ED for fall after getting caught in dog leash. Denies hitting head. Hit buttocks on floor. Increased pain to right lower back.

## 2022-12-01 NOTE — Progress Notes (Signed)
I was able to review Andrea Rogers CT scan and MRI.  She has a superior endplate fracture with a ventral hematoma.  This is causing some stenosis in her lumbar spine.  She currently does not have any neurologic symptoms.  We do feel that it be best for her to have observation overnight to evaluate whether or not she develops any neurologic symptoms.  We recommended getting baseline bladder scans.  Will plan on evaluating her in the morning after we have had time for observation.  At this point should she worsen or develop any neurologic symptoms please do not hesitate to reach out we will plan for repeat imaging and evaluation.  Full formal consultation to follow.

## 2022-12-01 NOTE — Assessment & Plan Note (Addendum)
Superior endplate fracture with a ventral hematoma secondary to traumatic fall Neurosurgery has been consulted, we appreciate further recommendations and guidance Close monitoring for urinary function Patient is status post bladder scan which per EDP had 200 mL of urine Strict I's and O's Fall precautions Admit to telemetry medical, observation

## 2022-12-01 NOTE — Assessment & Plan Note (Signed)
Non-insulin-dependent diabetes mellitus Home glipizide and metformin were not resumed on admission Insulin SSI with at bedtime coverage ordered Goal inpatient blood glucose level 140-180

## 2022-12-01 NOTE — Assessment & Plan Note (Signed)
Ativan 0.5 mg p.o. every 6 hours as needed for anxiety, 15 hours of coverage ordered

## 2022-12-01 NOTE — Hospital Course (Addendum)
[name] is a 62 year old female with history of tobacco use, non-insulin-dependent diabetes mellitus, generalized anxiety disorder, limb-girdle muscular dystrophy, primary osteoarthritis of both knees, acquired hypothyroid, hypertension, arthritis of the hands, who presents to the emergency department for chief concerns of falling after being caught in her dog's leash.  She endorses increased pain to her right lower back.  Vitals in the ED showed temperature of 98.4, respiration rate of 18, heart rate of 60, blood pressure of 117/64, SpO2 95% on room air.  Serum sodium 133, potassium 4.3, chloride 97, bicarb 27, BUN of 24, serum creatinine 0.68, nonfasting blood glucose 106, eGFR greater than 60, WBC 10.4, hemoglobin 15.2, platelets of 251.  Lipase was 32.  ED treatment: Ondansetron 4 mg p.o. one-time dose, oxycodone-acetaminophen 5-325 mg p.o. one-time dose, Ativan 0.5 mg p.o. one-time dose.

## 2022-12-01 NOTE — Assessment & Plan Note (Signed)
Fall precautions.  

## 2022-12-02 DIAGNOSIS — I10 Essential (primary) hypertension: Secondary | ICD-10-CM

## 2022-12-02 DIAGNOSIS — E871 Hypo-osmolality and hyponatremia: Secondary | ICD-10-CM | POA: Insufficient documentation

## 2022-12-02 DIAGNOSIS — S32010A Wedge compression fracture of first lumbar vertebra, initial encounter for closed fracture: Secondary | ICD-10-CM

## 2022-12-02 DIAGNOSIS — S064X0A Epidural hemorrhage without loss of consciousness, initial encounter: Secondary | ICD-10-CM | POA: Diagnosis not present

## 2022-12-02 LAB — CBC
HCT: 42.5 % (ref 36.0–46.0)
Hemoglobin: 14.5 g/dL (ref 12.0–15.0)
MCH: 32.5 pg (ref 26.0–34.0)
MCHC: 34.1 g/dL (ref 30.0–36.0)
MCV: 95.3 fL (ref 80.0–100.0)
Platelets: 231 10*3/uL (ref 150–400)
RBC: 4.46 MIL/uL (ref 3.87–5.11)
RDW: 13.1 % (ref 11.5–15.5)
WBC: 10.5 10*3/uL (ref 4.0–10.5)
nRBC: 0 % (ref 0.0–0.2)

## 2022-12-02 LAB — BASIC METABOLIC PANEL
Anion gap: 12 (ref 5–15)
BUN: 27 mg/dL — ABNORMAL HIGH (ref 8–23)
CO2: 24 mmol/L (ref 22–32)
Calcium: 9.1 mg/dL (ref 8.9–10.3)
Chloride: 97 mmol/L — ABNORMAL LOW (ref 98–111)
Creatinine, Ser: 0.7 mg/dL (ref 0.44–1.00)
GFR, Estimated: >60 mL/min (ref >60)
Glucose, Bld: 115 mg/dL — ABNORMAL HIGH (ref 70–99)
Potassium: 3.8 mmol/L (ref 3.5–5.1)
Sodium: 133 mmol/L — ABNORMAL LOW (ref 135–145)

## 2022-12-02 LAB — GLUCOSE, CAPILLARY: Glucose-Capillary: 130 mg/dL — ABNORMAL HIGH (ref 70–99)

## 2022-12-02 MED ORDER — HYDRALAZINE HCL 20 MG/ML IJ SOLN
5.0000 mg | INTRAMUSCULAR | Status: DC | PRN
  Filled 2022-12-02: qty 1

## 2022-12-02 MED ORDER — OXYCODONE-ACETAMINOPHEN 5-325 MG PO TABS: 1.0000 | ORAL_TABLET | Freq: Four times a day (QID) | ORAL | 0 refills | Status: DC | PRN

## 2022-12-02 NOTE — Discharge Summary (Signed)
Physician Discharge Summary   Patient: Andrea Rogers MRN: [id number] DOB: 08-Mar-1961  Admit date:     12/01/2022  Discharge date: 12/02/22  Discharge Physician: Marrion Coy   PCP: Barbette Reichmann, MD   Recommendations at discharge:   Follow-up with PCP in 1 week. Dr. Apolinar Junes Smith's office will call you for a visit in 2 weeks.  Discharge Diagnoses: Principal Problem:   Traumatic epidural hematoma (HCC) Active Problems:   Tobacco abuse   Acquired hypothyroidism   Diabetes mellitus type 2, controlled (HCC)   Essential hypertension   GAD (generalized anxiety disorder)   Limb-girdle muscular dystrophy (HCC)   HLD (hyperlipidemia)   Primary osteoarthritis of both knees   RLS (restless legs syndrome)   Statin intolerance   Lumbar compression fracture (HCC)   Hyponatremia Traumatic epidural hematoma Of the spine. Mild hyponatremia. Resolved Problems:   * No resolved hospital problems. [facility] Course: [name] is a 62 year old female with history of tobacco use, non-insulin-dependent diabetes mellitus, generalized anxiety disorder, limb-girdle muscular dystrophy, primary osteoarthritis of both knees, acquired hypothyroid, hypertension, arthritis of the hands, who presents to the emergency department for chief concerns of falling after being caught in her dog's leash.  She endorses increased pain to her right lower back. CT and MRI showed L1 compression fracture, T11-L2 epidural hematoma.  Patient is seen by neurosurgery, no need for emergent surgery.  Discussed with Dr. Ernestine Mcmurray, patient can be discharged home today, his office will call for a visit in 2 weeks.  Assessment and Plan: * Traumatic epidural hematoma (HCC) L1 compression fracture from the fall. Fall. Condition has been stable since admission.  Currently, patient does not have any incontinence of urine or stool.  Discussed with neurosurgery, stable for discharge.  Limb-girdle muscular dystrophy (HCC) Fall  precautions  GAD (generalized anxiety disorder) Zoom home treatment.  Diabetes mellitus type 2, controlled (HCC) Resume home treatment.  Tobacco abuse As needed nicotine patch ordered Patient is not ready to quit smoking  Essential hypertension. Mild hyponatremia. Resume home treatment as blood pressure is running high, also resumed diuretics.  Follow-up with PCP as outpatient.      Consultants: Neurosurgery. Procedures performed: None  Disposition: Home Diet recommendation:  Discharge Diet Orders (From admission, onward)     Start     Ordered   12/02/22 0000  Diet - low sodium heart healthy        12/02/22 1028           Cardiac diet DISCHARGE MEDICATION: Allergies as of 12/02/2022       Reactions   Latex Rash   Penicillins Other (See Comments)   Yeast infection Did it involve swelling of the face/tongue/throat, SOB, or low BP? No Did it involve sudden or severe rash/hives, skin peeling, or any reaction on the inside of your mouth or nose? No Did you need to seek medical attention at a hospital or doctor's office? No When did it last happen?       If all above answers are "NO", may proceed with cephalosporin use.        Medication List     STOP taking these medications    HYDROcodone-acetaminophen 7.5-325 MG tablet Commonly known as: NORCO   [place] 6.5 MG Inst Generic drug: Prasterone   meclizine 25 MG tablet Commonly known as: ANTIVERT       TAKE these medications    albuterol 108 (90 Base) MCG/ACT inhaler Commonly known as: VENTOLIN HFA Inhale 2 puffs  into the lungs every 6 (six) hours as needed for wheezing or shortness of breath.   ALPRAZolam 0.25 MG tablet Commonly known as: XANAX Take 0.25 mg by mouth at bedtime as needed for anxiety or sleep. What changed: Another medication with the same name was removed. Continue taking this medication, and follow the directions you see here.   aspirin EC 81 MG tablet Take 81 mg by mouth  daily.   atenolol 100 MG tablet Commonly known as: TENORMIN Take 200 mg by mouth daily.   ezetimibe 10 MG tablet Commonly known as: ZETIA Take 10 mg by mouth at bedtime.   ferrous sulfate 325 (65 FE) MG EC tablet Take 325 mg by mouth daily with breakfast.   glimepiride 1 MG tablet Commonly known as: AMARYL Take 1 mg by mouth daily.   levothyroxine 100 MCG tablet Commonly known as: SYNTHROID Take 100 mcg by mouth daily before breakfast.   MAGNESIUM PO Take 250 mg by mouth daily.   metFORMIN 500 MG tablet Commonly known as: GLUCOPHAGE Take 1,000 mg by mouth 2 (two) times daily with a meal. What changed: Another medication with the same name was removed. Continue taking this medication, and follow the directions you see here.   omeprazole 40 MG capsule Commonly known as: PRILOSEC Take 40 mg by mouth daily. What changed: Another medication with the same name was removed. Continue taking this medication, and follow the directions you see here.   oxyCODONE-acetaminophen 5-325 MG tablet Commonly known as: PERCOCET/ROXICET Take 1 tablet by mouth every 6 (six) hours as needed for moderate pain or severe pain.   triamterene-hydrochlorothiazide 75-50 MG tablet Commonly known as: MAXZIDE Take 1 tablet by mouth daily.   vitamin B-12 500 MCG tablet Commonly known as: CYANOCOBALAMIN Take 500 mcg by mouth daily.        Follow-up Information     Barbette Reichmann, MD Follow up in 1 week(s).   Specialty: Internal Medicine Contact information: [address] [place] [address] [phone]                Discharge Exam: Ceasar Mons Weights   12/01/22 1346 12/01/22 2159 12/02/22 0806  Weight: 54 kg 52.4 kg 52.3 kg   General exam: Appears calm and comfortable  Respiratory system: Clear to auscultation. Respiratory effort normal. Cardiovascular system: S1 & S2 heard, RRR. No JVD, murmurs, rubs, gallops or clicks. No pedal  edema. Gastrointestinal system: Abdomen is nondistended, soft and nontender. No organomegaly or masses felt. Normal bowel sounds heard. Central nervous system: Alert and oriented. No focal neurological deficits. Extremities: Symmetric 5 x 5 power. Skin: No rashes, lesions or ulcers Psychiatry: Judgement and insight appear normal. Mood & affect appropriate.    Condition at discharge: good  The results of significant diagnostics from this hospitalization (including imaging, microbiology, ancillary and laboratory) are listed below for reference.   Imaging Studies: MR THORACIC SPINE WO CONTRAST  Result Date: 12/01/2022 CLINICAL DATA:  Compression fracture, thoracic; Compression fracture, lumbar EXAM: MRI THORACIC AND LUMBAR SPINE WITHOUT CONTRAST TECHNIQUE: Multiplanar and multiecho pulse sequences of the thoracic and lumbar spine were obtained without intravenous contrast. COMPARISON:  None Available. FINDINGS: MRI THORACIC SPINE FINDINGS Alignment:  No substantial sagittal subluxation. Vertebrae: Remote T12 compression fracture with approximately 20% height loss. No associated bone marrow edema at this level. Cord:  Normal cord signal. Paraspinal and other soft tissues: Paraspinal muscular atrophy. Disc levels: The ventral epidural hematoma described in the lumbar findings section extends superiorly  to the T11 level and contributes to mild canal stenosis at T11-T12 and T12-L1. Otherwise, in the thoracic spine there is no significant canal or foraminal stenosis. MRI LUMBAR SPINE FINDINGS Segmentation: Normal. Alignment:  No substantial sagittal subluxation. Vertebrae: Acute L1 compression fracture with approximately 10% height loss. Conus medullaris and cauda equina: Conus extends to the L1-L2 level. Conus and cauda equina appear normal. Paraspinal and other soft tissues:  Paraspinal muscular atrophy. Disc levels: Ventral epidural heterogeneous hematoma which extends from T11 inferiorly to L2 and measures  up to 8 mm in thickness. Resulting moderate canal stenosis at the L1 level and mild canal stenosis at the L1-L2 and T11-T12 level. At L4-L5 and L5-S1 there is disc bulging and facet arthropathy which results in moderate left foraminal stenosis at L5-S1. Otherwise, degenerative changes do not contribute to significant stenosis. IMPRESSION: MR THORACIC SPINE IMPRESSION Remote T12 compression fracture with approximately 20% height loss. MR LUMBAR SPINE IMPRESSION 1. Acute L1 compression fracture with approximately 10% height loss and suspected involvement of the pedicles. 2. Traumatic ventral epidural hematoma which extends from T11 inferiorly to L2 and measures up to 8 mm in thickness. Resulting moderate canal stenosis at the L1 level and mild canal stenosis at the L1-L2 and T11-T12 levels. 3. Superimposed degenerative changes with moderate left foraminal stenosis at L5-S1. Electronically Signed   By: Feliberto Harts M.D.   On: 12/01/2022 20:10   MR LUMBAR SPINE WO CONTRAST  Result Date: 12/01/2022 CLINICAL DATA:  Compression fracture, thoracic; Compression fracture, lumbar EXAM: MRI THORACIC AND LUMBAR SPINE WITHOUT CONTRAST TECHNIQUE: Multiplanar and multiecho pulse sequences of the thoracic and lumbar spine were obtained without intravenous contrast. COMPARISON:  None Available. FINDINGS: MRI THORACIC SPINE FINDINGS Alignment:  No substantial sagittal subluxation. Vertebrae: Remote T12 compression fracture with approximately 20% height loss. No associated bone marrow edema at this level. Cord:  Normal cord signal. Paraspinal and other soft tissues: Paraspinal muscular atrophy. Disc levels: The ventral epidural hematoma described in the lumbar findings section extends superiorly to the T11 level and contributes to mild canal stenosis at T11-T12 and T12-L1. Otherwise, in the thoracic spine there is no significant canal or foraminal stenosis. MRI LUMBAR SPINE FINDINGS Segmentation: Normal. Alignment:  No  substantial sagittal subluxation. Vertebrae: Acute L1 compression fracture with approximately 10% height loss. Conus medullaris and cauda equina: Conus extends to the L1-L2 level. Conus and cauda equina appear normal. Paraspinal and other soft tissues:  Paraspinal muscular atrophy. Disc levels: Ventral epidural heterogeneous hematoma which extends from T11 inferiorly to L2 and measures up to 8 mm in thickness. Resulting moderate canal stenosis at the L1 level and mild canal stenosis at the L1-L2 and T11-T12 level. At L4-L5 and L5-S1 there is disc bulging and facet arthropathy which results in moderate left foraminal stenosis at L5-S1. Otherwise, degenerative changes do not contribute to significant stenosis. IMPRESSION: MR THORACIC SPINE IMPRESSION Remote T12 compression fracture with approximately 20% height loss. MR LUMBAR SPINE IMPRESSION 1. Acute L1 compression fracture with approximately 10% height loss and suspected involvement of the pedicles. 2. Traumatic ventral epidural hematoma which extends from T11 inferiorly to L2 and measures up to 8 mm in thickness. Resulting moderate canal stenosis at the L1 level and mild canal stenosis at the L1-L2 and T11-T12 levels. 3. Superimposed degenerative changes with moderate left foraminal stenosis at L5-S1. Electronically Signed   By: Feliberto Harts M.D.   On: 12/01/2022 20:10   CT PELVIS WO CONTRAST  Result Date: 12/01/2022 CLINICAL DATA:  Hip trauma with fracture suspected EXAM: CT PELVIS WITHOUT CONTRAST TECHNIQUE: Multidetector CT imaging of the pelvis was performed following the standard protocol without intravenous contrast. RADIATION DOSE REDUCTION: This exam was performed according to the departmental dose-optimization program which includes automated exposure control, adjustment of the mA and/or kV according to patient size and/or use of iterative reconstruction technique. COMPARISON:  None Available. FINDINGS: Negative for hip fracture or dislocation.  Negative for pelvic ring fracture or diastasis. Profound fatty atrophy of muscles diffusely. There is chart history of muscular dystrophy. IMPRESSION: 1. No acute finding.  No hip fracture or dislocation. 2. Marked muscular atrophy in keeping with history of muscular dystrophy. Electronically Signed   By: Tiburcio Pea M.D.   On: 12/01/2022 17:15   CT Cervical Spine Wo Contrast  Result Date: 12/01/2022 CLINICAL DATA:  Back trauma, no prior imaging (Age >= 16y); Head trauma, intracranial venous injury suspected; Neck trauma, dangerous injury mechanism (Age 56-64y) EXAM: CT THORACIC SPINE WITHOUT CONTRAST CT HEAD WITHOUT CONTRAST CT LUMBAR SPINE WITHOUT CONTRAST CT CERVICAL SPINE WITHOUT CONTRAST TECHNIQUE: Multidetector CT images of the head, cervical spine, thoracic spine, and lumbar spine were obtained using the standard protocol without intravenous contrast. RADIATION DOSE REDUCTION: This exam was performed according to the departmental dose-optimization program which includes automated exposure control, adjustment of the mA and/or kV according to patient size and/or use of iterative reconstruction technique. COMPARISON:  None Available. FINDINGS: CT HEAD FINDINGS: Brain: No evidence of acute infarction, hemorrhage, hydrocephalus, extra-axial collection or mass lesion/mass effect. There is sequela of moderate chronic microvascular ischemic change. Vascular: No hyperdense vessel or unexpected calcification. Skull: Normal. Negative for fracture or focal lesion. Sinuses/Orbits: No middle ear or mastoid effusion. Paranasal sinuses are clear. Orbits are unremarkable. Other: None. CT CERVICAL SPINE FINDINGS Alignment: Straightening of the normal cervical lordosis. Skull base and vertebrae: No acute fracture. No primary bone lesion or focal pathologic process. Soft tissues and spinal canal: No prevertebral fluid or swelling. No visible canal hematoma. Disc levels:  None Upper chest: Negative. CT THORACIC SPINE  FINDINGS Alignment: Normal. Vertebrae: No acute fracture or focal pathologic process. Chronic superior endplate compression deformity at T12. Paraspinal and other soft tissues: Dependent atelectasis Disc levels: There is nonspecific layering hyperdense material in the spinal canal (series 5, image 66). This may be artifactual, but could also represent a small amount epidural blood products. CT LUMBAR SPINE FINDINGS Segmentation: 5 lumbar type vertebrae. Alignment: Normal. Vertebrae: There is an acute superior endplate compression deformity at L1. Paraspinal and other soft tissues: Aortic atherosclerotic calcifications. Diverticulosis without diverticulitis. Disc levels: No evidence of high-grade spinal canal stenosis. IMPRESSION: 1. No CT evidence of intracranial injury 2. No acute fracture or traumatic subluxation of the cervical spine. 3. Acute superior endplate compression deformity at L1. 4. Nonspecific layering hyperdense material in the spinal canal at the level of T12. This may be artifactual or represent nonspecific dural thickening. Recommend further evaluation with MRI of the thoracic spine to exclude the possibility of epidural blood products. Aortic Atherosclerosis (ICD10-I70.0). Electronically Signed   By: Lorenza Cambridge M.D.   On: 12/01/2022 16:50   CT Head Wo Contrast  Result Date: 12/01/2022 CLINICAL DATA:  Back trauma, no prior imaging (Age >= 16y); Head trauma, intracranial venous injury suspected; Neck trauma, dangerous injury mechanism (Age 51-64y) EXAM: CT THORACIC SPINE WITHOUT CONTRAST CT HEAD WITHOUT CONTRAST CT LUMBAR SPINE WITHOUT CONTRAST CT CERVICAL SPINE WITHOUT CONTRAST TECHNIQUE: Multidetector CT images of the head, cervical spine, thoracic spine, and  lumbar spine were obtained using the standard protocol without intravenous contrast. RADIATION DOSE REDUCTION: This exam was performed according to the departmental dose-optimization program which includes automated exposure control,  adjustment of the mA and/or kV according to patient size and/or use of iterative reconstruction technique. COMPARISON:  None Available. FINDINGS: CT HEAD FINDINGS: Brain: No evidence of acute infarction, hemorrhage, hydrocephalus, extra-axial collection or mass lesion/mass effect. There is sequela of moderate chronic microvascular ischemic change. Vascular: No hyperdense vessel or unexpected calcification. Skull: Normal. Negative for fracture or focal lesion. Sinuses/Orbits: No middle ear or mastoid effusion. Paranasal sinuses are clear. Orbits are unremarkable. Other: None. CT CERVICAL SPINE FINDINGS Alignment: Straightening of the normal cervical lordosis. Skull base and vertebrae: No acute fracture. No primary bone lesion or focal pathologic process. Soft tissues and spinal canal: No prevertebral fluid or swelling. No visible canal hematoma. Disc levels:  None Upper chest: Negative. CT THORACIC SPINE FINDINGS Alignment: Normal. Vertebrae: No acute fracture or focal pathologic process. Chronic superior endplate compression deformity at T12. Paraspinal and other soft tissues: Dependent atelectasis Disc levels: There is nonspecific layering hyperdense material in the spinal canal (series 5, image 66). This may be artifactual, but could also represent a small amount epidural blood products. CT LUMBAR SPINE FINDINGS Segmentation: 5 lumbar type vertebrae. Alignment: Normal. Vertebrae: There is an acute superior endplate compression deformity at L1. Paraspinal and other soft tissues: Aortic atherosclerotic calcifications. Diverticulosis without diverticulitis. Disc levels: No evidence of high-grade spinal canal stenosis. IMPRESSION: 1. No CT evidence of intracranial injury 2. No acute fracture or traumatic subluxation of the cervical spine. 3. Acute superior endplate compression deformity at L1. 4. Nonspecific layering hyperdense material in the spinal canal at the level of T12. This may be artifactual or represent  nonspecific dural thickening. Recommend further evaluation with MRI of the thoracic spine to exclude the possibility of epidural blood products. Aortic Atherosclerosis (ICD10-I70.0). Electronically Signed   By: Lorenza Cambridge M.D.   On: 12/01/2022 16:50   CT Lumbar Spine Wo Contrast  Result Date: 12/01/2022 CLINICAL DATA:  Back trauma, no prior imaging (Age >= 16y); Head trauma, intracranial venous injury suspected; Neck trauma, dangerous injury mechanism (Age 102-64y) EXAM: CT THORACIC SPINE WITHOUT CONTRAST CT HEAD WITHOUT CONTRAST CT LUMBAR SPINE WITHOUT CONTRAST CT CERVICAL SPINE WITHOUT CONTRAST TECHNIQUE: Multidetector CT images of the head, cervical spine, thoracic spine, and lumbar spine were obtained using the standard protocol without intravenous contrast. RADIATION DOSE REDUCTION: This exam was performed according to the departmental dose-optimization program which includes automated exposure control, adjustment of the mA and/or kV according to patient size and/or use of iterative reconstruction technique. COMPARISON:  None Available. FINDINGS: CT HEAD FINDINGS: Brain: No evidence of acute infarction, hemorrhage, hydrocephalus, extra-axial collection or mass lesion/mass effect. There is sequela of moderate chronic microvascular ischemic change. Vascular: No hyperdense vessel or unexpected calcification. Skull: Normal. Negative for fracture or focal lesion. Sinuses/Orbits: No middle ear or mastoid effusion. Paranasal sinuses are clear. Orbits are unremarkable. Other: None. CT CERVICAL SPINE FINDINGS Alignment: Straightening of the normal cervical lordosis. Skull base and vertebrae: No acute fracture. No primary bone lesion or focal pathologic process. Soft tissues and spinal canal: No prevertebral fluid or swelling. No visible canal hematoma. Disc levels:  None Upper chest: Negative. CT THORACIC SPINE FINDINGS Alignment: Normal. Vertebrae: No acute fracture or focal pathologic process. Chronic superior  endplate compression deformity at T12. Paraspinal and other soft tissues: Dependent atelectasis Disc levels: There is nonspecific layering hyperdense material in the  spinal canal (series 5, image 66). This may be artifactual, but could also represent a small amount epidural blood products. CT LUMBAR SPINE FINDINGS Segmentation: 5 lumbar type vertebrae. Alignment: Normal. Vertebrae: There is an acute superior endplate compression deformity at L1. Paraspinal and other soft tissues: Aortic atherosclerotic calcifications. Diverticulosis without diverticulitis. Disc levels: No evidence of high-grade spinal canal stenosis. IMPRESSION: 1. No CT evidence of intracranial injury 2. No acute fracture or traumatic subluxation of the cervical spine. 3. Acute superior endplate compression deformity at L1. 4. Nonspecific layering hyperdense material in the spinal canal at the level of T12. This may be artifactual or represent nonspecific dural thickening. Recommend further evaluation with MRI of the thoracic spine to exclude the possibility of epidural blood products. Aortic Atherosclerosis (ICD10-I70.0). Electronically Signed   By: Lorenza Cambridge M.D.   On: 12/01/2022 16:50   CT Thoracic Spine Wo Contrast  Result Date: 12/01/2022 CLINICAL DATA:  Back trauma, no prior imaging (Age >= 16y); Head trauma, intracranial venous injury suspected; Neck trauma, dangerous injury mechanism (Age 39-64y) EXAM: CT THORACIC SPINE WITHOUT CONTRAST CT HEAD WITHOUT CONTRAST CT LUMBAR SPINE WITHOUT CONTRAST CT CERVICAL SPINE WITHOUT CONTRAST TECHNIQUE: Multidetector CT images of the head, cervical spine, thoracic spine, and lumbar spine were obtained using the standard protocol without intravenous contrast. RADIATION DOSE REDUCTION: This exam was performed according to the departmental dose-optimization program which includes automated exposure control, adjustment of the mA and/or kV according to patient size and/or use of iterative reconstruction  technique. COMPARISON:  None Available. FINDINGS: CT HEAD FINDINGS: Brain: No evidence of acute infarction, hemorrhage, hydrocephalus, extra-axial collection or mass lesion/mass effect. There is sequela of moderate chronic microvascular ischemic change. Vascular: No hyperdense vessel or unexpected calcification. Skull: Normal. Negative for fracture or focal lesion. Sinuses/Orbits: No middle ear or mastoid effusion. Paranasal sinuses are clear. Orbits are unremarkable. Other: None. CT CERVICAL SPINE FINDINGS Alignment: Straightening of the normal cervical lordosis. Skull base and vertebrae: No acute fracture. No primary bone lesion or focal pathologic process. Soft tissues and spinal canal: No prevertebral fluid or swelling. No visible canal hematoma. Disc levels:  None Upper chest: Negative. CT THORACIC SPINE FINDINGS Alignment: Normal. Vertebrae: No acute fracture or focal pathologic process. Chronic superior endplate compression deformity at T12. Paraspinal and other soft tissues: Dependent atelectasis Disc levels: There is nonspecific layering hyperdense material in the spinal canal (series 5, image 66). This may be artifactual, but could also represent a small amount epidural blood products. CT LUMBAR SPINE FINDINGS Segmentation: 5 lumbar type vertebrae. Alignment: Normal. Vertebrae: There is an acute superior endplate compression deformity at L1. Paraspinal and other soft tissues: Aortic atherosclerotic calcifications. Diverticulosis without diverticulitis. Disc levels: No evidence of high-grade spinal canal stenosis. IMPRESSION: 1. No CT evidence of intracranial injury 2. No acute fracture or traumatic subluxation of the cervical spine. 3. Acute superior endplate compression deformity at L1. 4. Nonspecific layering hyperdense material in the spinal canal at the level of T12. This may be artifactual or represent nonspecific dural thickening. Recommend further evaluation with MRI of the thoracic spine to exclude  the possibility of epidural blood products. Aortic Atherosclerosis (ICD10-I70.0). Electronically Signed   By: Lorenza Cambridge M.D.   On: 12/01/2022 16:50    Microbiology: Results for orders placed or performed during the hospital encounter of 08/09/19  SARS CORONAVIRUS 2 (TAT 6-24 HRS) Nasopharyngeal Nasopharyngeal Swab     Status: None   Collection Time: 08/09/19  9:14 AM   Specimen: Nasopharyngeal Swab  Result  Value Ref Range Status   SARS Coronavirus 2 NEGATIVE NEGATIVE Final    Comment: (NOTE) SARS-CoV-2 target nucleic acids are NOT DETECTED. The SARS-CoV-2 RNA is generally detectable in upper and lower respiratory specimens during the acute phase of infection. Negative results do not preclude SARS-CoV-2 infection, do not rule out co-infections with other pathogens, and should not be used as the sole basis for treatment or other patient management decisions. Negative results must be combined with clinical observations, patient history, and epidemiological information. The expected result is Negative. Fact Sheet for Patients: HairSlick.no Fact Sheet for Healthcare Providers: quierodirigir.com This test is not yet approved or cleared by the [place] FDA and  has been authorized for detection and/or diagnosis of SARS-CoV-2 by FDA under an Emergency Use Authorization (EUA). This EUA will remain  in effect (meaning this test can be used) for the duration of the COVID-19 declaration under Section 56 4(b)(1) of the Act, 21 U.S.C. section 360bbb-3(b)(1), unless the authorization is terminated or revoked sooner. Performed at [facility], [address] [place], [place] [address]     Labs: CBC: Recent Labs  Lab 12/01/22 2105 12/02/22 0435  WBC 10.4 10.5  NEUTROABS 7.5  --   HGB 15.2* 14.5  HCT 45.5 42.5  MCV 97.4 95.3  PLT 251 231   Basic Metabolic Panel: Recent Labs  Lab 12/01/22 2105 12/02/22 0435  NA  133* 133*  K 4.3 3.8  CL 97* 97*  CO2 27 24  GLUCOSE 106* 115*  BUN 24* 27*  CREATININE 0.68 0.70  CALCIUM 9.1 9.1   Liver Function Tests: Recent Labs  Lab 12/01/22 2105  AST 18  ALT 20  ALKPHOS 49  BILITOT 0.7  PROT 7.1  ALBUMIN 4.0   CBG: Recent Labs  Lab 12/01/22 2108 12/02/22 0807  GLUCAP 109* 130*    Discharge time spent: greater than 30 minutes.  Signed: Marrion Coy, MD [facility] 12/02/2022

## 2022-12-02 NOTE — Progress Notes (Addendum)
Pt is complaining of 5/10 headache. BP at 188/74 MAP 104 HR 69. MD Para March made aware. Will continue to monitor.  Update 0331: MD Para March placed order. Will continue to monitor.

## 2022-12-02 NOTE — Progress Notes (Signed)
Reviewed discharge instructions with pt. Pt verbalized understanding. Pt discharged with all personal belongings. Staff wheeled pt out. Pt transported to home via family car.  

## 2022-12-02 NOTE — Plan of Care (Signed)
  Problem: Education: Goal: Knowledge of General Education information will improve Description: Including pain rating scale, medication(s)/side effects and non-pharmacologic comfort measures Outcome: Progressing   Problem: Clinical Measurements: Goal: Respiratory complications will improve Outcome: Progressing   Problem: Activity: Goal: Risk for activity intolerance will decrease Outcome: Progressing   Problem: Pain Managment: Goal: General experience of comfort will improve Outcome: Progressing   Problem: Safety: Goal: Ability to remain free from injury will improve Outcome: Progressing   

## 2022-12-02 NOTE — Plan of Care (Signed)

## 2022-12-03 LAB — HEMOGLOBIN A1C
Hgb A1c MFr Bld: 6 % — ABNORMAL HIGH (ref 4.8–5.6)
Mean Plasma Glucose: 126 mg/dL

## 2022-12-16 ENCOUNTER — Ambulatory Visit
Admission: RE | Admit: 2022-12-16 | Discharge: 2022-12-16 | Disposition: A | Discharge status: Home / Self Care | Payer: 59 | Source: Ambulatory Visit | Attending: Neurosurgery | Admitting: Neurosurgery

## 2022-12-16 ENCOUNTER — Other Ambulatory Visit: Payer: Self-pay

## 2022-12-16 ENCOUNTER — Ambulatory Visit
Admission: RE | Admit: 2022-12-16 | Discharge: 2022-12-16 | Disposition: A | Discharge status: Home / Self Care | Payer: 59 | Attending: Neurosurgery | Admitting: Neurosurgery

## 2022-12-16 ENCOUNTER — Ambulatory Visit (INDEPENDENT_AMBULATORY_CARE_PROVIDER_SITE_OTHER): Payer: 59 | Admitting: Neurosurgery

## 2022-12-16 ENCOUNTER — Encounter: Payer: Self-pay | Admitting: Neurosurgery

## 2022-12-16 VITALS — BP 124/70 | Ht 64.0 in | Wt 115.4 lb

## 2022-12-16 DIAGNOSIS — M4854XA Collapsed vertebra, not elsewhere classified, thoracic region, initial encounter for fracture: Secondary | ICD-10-CM | POA: Diagnosis not present

## 2022-12-16 DIAGNOSIS — W19XXXD Unspecified fall, subsequent encounter: Secondary | ICD-10-CM

## 2022-12-16 DIAGNOSIS — S32010S Wedge compression fracture of first lumbar vertebra, sequela: Secondary | ICD-10-CM

## 2022-12-16 DIAGNOSIS — S32010D Wedge compression fracture of first lumbar vertebra, subsequent encounter for fracture with routine healing: Secondary | ICD-10-CM | POA: Insufficient documentation

## 2022-12-16 NOTE — Progress Notes (Signed)
Referring Physician:  No referring provider defined for this encounter.  Primary Physician:  Barbette Reichmann, MD  History of Present Illness: 12/16/2022 Andrea Rogers is here today with a chief complaint of prior compression fracture.  She was admitted for compression fracture with a epidural hematoma.  She is watched conservatively.  She has a history of muscular dystrophy and has severe muscle wasting at baseline.  She has not developed any new neurologic symptoms.  States that her nerve pain has not increased.  She is not having any bowel or bladder symptoms.  No new numbness or tingling.  No new weakness.  She does get pain when she braces her self to stand up from sitting or to sit from standing.  She is eventually able to find a comfortable position.  She does have a history of advanced muscular dystrophy and has not had good luck with braces in the past.  We did discuss possibility of bracing for comfort when she was inpatient.  Review of Systems:  A 10 point review of systems is negative, except for the pertinent positives and negatives detailed in the HPI.  Past Medical History: Past Medical History:  Diagnosis Date   Anal condyloma    Arthritis    Bronchitis    Diabetes mellitus without complication (HCC)    GERD (gastroesophageal reflux disease)    Headache    HLD (hyperlipidemia)    Hypertension    Hypothyroidism    MD (muscular dystrophy) (HCC)    Menopause    Muscular dystrophy, limb girdle (HCC)    Osteopenia    Recurrent UTI    Stroke (HCC) 02/2019   TIA   Thyroid disease    Tobacco abuse     Past Surgical History: Past Surgical History:  Procedure Laterality Date   COLONOSCOPY WITH PROPOFOL N/A 04/25/2018   Procedure: COLONOSCOPY WITH PROPOFOL;  Surgeon: Christena Deem, MD;  Location: [facility] ENDOSCOPY;  Service: Endoscopy;  Laterality: N/A;   ENDARTERECTOMY Right 08/11/2019   Procedure: ENDARTERECTOMY CAROTID;  Surgeon: Renford Dills, MD;   Location: [facility] ORS;  Service: Vascular;  Laterality: Right;   NO PAST SURGERIES      Allergies: Allergies as of 12/16/2022 - Review Complete 12/16/2022  Allergen Reaction Noted   Latex Rash 04/09/2015   Penicillins Other (See Comments) 10/26/2013    Medications:  Current Outpatient Medications:    albuterol (VENTOLIN HFA) 108 (90 Base) MCG/ACT inhaler, Inhale 2 puffs into the lungs every 6 (six) hours as needed for wheezing or shortness of breath. , Disp: , Rfl:    ALPRAZolam (XANAX) 0.25 MG tablet, Take 0.25 mg by mouth at bedtime as needed for anxiety or sleep. , Disp: , Rfl:    aspirin EC 81 MG tablet, Take 81 mg by mouth daily., Disp: , Rfl:    atenolol (TENORMIN) 100 MG tablet, Take 200 mg by mouth daily. , Disp: , Rfl:    ezetimibe (ZETIA) 10 MG tablet, Take 10 mg by mouth at bedtime. , Disp: , Rfl:    ferrous sulfate 325 (65 FE) MG EC tablet, Take 325 mg by mouth daily with breakfast., Disp: , Rfl:    glimepiride (AMARYL) 1 MG tablet, Take 1 mg by mouth daily. , Disp: , Rfl:    levothyroxine (SYNTHROID) 100 MCG tablet, Take 100 mcg by mouth daily before breakfast., Disp: , Rfl:    MAGNESIUM PO, Take 250 mg by mouth daily., Disp: , Rfl:    metFORMIN (GLUCOPHAGE) 500  MG tablet, Take 1,000 mg by mouth 2 (two) times daily with a meal. , Disp: , Rfl:    omeprazole (PRILOSEC) 40 MG capsule, Take 40 mg by mouth daily., Disp: , Rfl:    oxyCODONE-acetaminophen (PERCOCET/ROXICET) 5-325 MG tablet, Take 1 tablet by mouth every 6 (six) hours as needed for moderate pain or severe pain., Disp: 12 tablet, Rfl: 0   triamterene-hydrochlorothiazide (MAXZIDE) 75-50 MG tablet, Take 1 tablet by mouth daily. , Disp: , Rfl:    vitamin B-12 (CYANOCOBALAMIN) 500 MCG tablet, Take 500 mcg by mouth daily., Disp: , Rfl:   Social History: Social History   Tobacco Use   Smoking status: Every Day    Current packs/day: 1.00    Types: Cigarettes   Smokeless tobacco: Never  Vaping Use   Vaping status: Never  Used  Substance Use Topics   Alcohol use: No   Drug use: No    Family Medical History: Family History  Adopted: Yes  Problem Relation Age of Onset   Breast cancer Mother    Colon cancer Father    Diabetes Father     Physical Examination: Vitals:   12/16/22 1112  BP: 124/70    General: Patient is in no apparent distress. Attention to examination is appropriate.  Neck:   Supple.  Full range of motion.  Respiratory: Patient is breathing without any difficulty.   NEUROLOGICAL:     Awake, alert, oriented to person, place, and time.  Speech is clear and fluent.   Cranial Nerves: Pupils equal round and reactive to light.  Facial tone is symmetric.  Facial sensation is symmetric. Shoulder shrug is symmetric. Tongue protrusion is midline.    Strength: She has good antigravity strength in the bilateral lower extremities.  She does have significant muscle wasting in the proximal lower extremities consistent with her diagnosis of MD, she does state that she has not had any progressive muscle loss since this injury.   Reflexes are 1+ and symmetric at the biceps, triceps, brachioradialis, patella and achilles.   Hoffman's is absent. Clonus is absent  Bilateral upper and lower extremity sensation is intact to light touch        Imaging:  I have personally reviewed the images and agree with the above interpretation.  Medical Decision Making/Assessment and Plan: Andrea Rogers is a pleasant 62 y.o. female with a history of fall with a compression fracture and epidural hematoma.  She was treated conservatively.  She did not have any neurologic symptoms at the time.  She has been since discharged and has thankfully had an improvement in her pain but still has significant pain especially while trying to stand or trying to sit.  Its mostly noted with forward flexion activities and exertion.  When she is able to be stable she is able to tolerate her symptoms.  Overall she feels that she is  improving.  From a neurologic standpoint she has not developed any symptoms.  Repeat x-rays show the presence of a stable compression fracture with no evidence of progressive kyphosis.  We did discuss the possibility of using a brace however she is apprehensive giving her history of MD, states that she has previously had difficulty with braces in the past.  Would like to see her again in approximately 6 weeks as she is having routine healing, would like to evaluate to make sure her fracture is not progressing.  At this point we will continue to manage conservatively.  Thank you for involving me in the  care of this patient.    Lovenia Kim MD/MSCR Neurosurgery

## 2022-12-31 DIAGNOSIS — E039 Hypothyroidism, unspecified: Secondary | ICD-10-CM | POA: Diagnosis not present

## 2022-12-31 DIAGNOSIS — R29898 Other symptoms and signs involving the musculoskeletal system: Secondary | ICD-10-CM | POA: Diagnosis not present

## 2022-12-31 DIAGNOSIS — M19049 Primary osteoarthritis, unspecified hand: Secondary | ICD-10-CM | POA: Diagnosis not present

## 2022-12-31 DIAGNOSIS — I10 Essential (primary) hypertension: Secondary | ICD-10-CM | POA: Diagnosis not present

## 2022-12-31 DIAGNOSIS — G71039 Limb girdle muscular dystrophy, unspecified: Secondary | ICD-10-CM | POA: Diagnosis not present

## 2022-12-31 DIAGNOSIS — M170 Bilateral primary osteoarthritis of knee: Secondary | ICD-10-CM | POA: Diagnosis not present

## 2022-12-31 DIAGNOSIS — R634 Abnormal weight loss: Secondary | ICD-10-CM | POA: Diagnosis not present

## 2022-12-31 DIAGNOSIS — Z720 Tobacco use: Secondary | ICD-10-CM | POA: Diagnosis not present

## 2022-12-31 DIAGNOSIS — F411 Generalized anxiety disorder: Secondary | ICD-10-CM | POA: Diagnosis not present

## 2022-12-31 DIAGNOSIS — E1165 Type 2 diabetes mellitus with hyperglycemia: Secondary | ICD-10-CM | POA: Diagnosis not present

## 2023-01-07 DIAGNOSIS — Z0000 Encounter for general adult medical examination without abnormal findings: Secondary | ICD-10-CM | POA: Diagnosis not present

## 2023-01-07 DIAGNOSIS — E039 Hypothyroidism, unspecified: Secondary | ICD-10-CM | POA: Diagnosis not present

## 2023-01-07 DIAGNOSIS — S32010A Wedge compression fracture of first lumbar vertebra, initial encounter for closed fracture: Secondary | ICD-10-CM | POA: Diagnosis not present

## 2023-01-07 DIAGNOSIS — Z1331 Encounter for screening for depression: Secondary | ICD-10-CM | POA: Diagnosis not present

## 2023-01-07 DIAGNOSIS — F17210 Nicotine dependence, cigarettes, uncomplicated: Secondary | ICD-10-CM | POA: Diagnosis not present

## 2023-01-07 DIAGNOSIS — E1165 Type 2 diabetes mellitus with hyperglycemia: Secondary | ICD-10-CM | POA: Diagnosis not present

## 2023-01-07 DIAGNOSIS — G71039 Limb girdle muscular dystrophy, unspecified: Secondary | ICD-10-CM | POA: Diagnosis not present

## 2023-01-07 DIAGNOSIS — G2581 Restless legs syndrome: Secondary | ICD-10-CM | POA: Diagnosis not present

## 2023-01-07 DIAGNOSIS — Z9181 History of falling: Secondary | ICD-10-CM | POA: Diagnosis not present

## 2023-02-03 ENCOUNTER — Ambulatory Visit (INDEPENDENT_AMBULATORY_CARE_PROVIDER_SITE_OTHER): Payer: 59 | Admitting: Neurosurgery

## 2023-02-03 ENCOUNTER — Encounter: Payer: Self-pay | Admitting: Neurosurgery

## 2023-02-03 VITALS — BP 116/72 | Ht 64.0 in | Wt 120.0 lb

## 2023-02-03 DIAGNOSIS — W19XXXD Unspecified fall, subsequent encounter: Secondary | ICD-10-CM | POA: Diagnosis not present

## 2023-02-03 DIAGNOSIS — S32010D Wedge compression fracture of first lumbar vertebra, subsequent encounter for fracture with routine healing: Secondary | ICD-10-CM

## 2023-02-03 NOTE — Progress Notes (Signed)
Referring Physician:  Barbette Reichmann, MD [address] [facility] [place],  [place] [address]  Primary Physician:  Barbette Reichmann, MD  History of Present Illness: 02/03/2023 [name] is here today with a chief complaint of prior compression fracture.  She was admitted for compression fracture with a epidural hematoma.  She is watched conservatively.  She has a history of muscular dystrophy and has severe muscle wasting at baseline.    Overall she has recovered quite well.  She is not having any new back pain.  Has continued to mobilize.  Currently out of the brace.  No new back pain.  No new neurologic symptoms.  Review of Systems:  A 10 point review of systems is negative, except for the pertinent positives and negatives detailed in the HPI.  Past Medical History: Past Medical History:  Diagnosis Date   Anal condyloma    Arthritis    Bronchitis    Diabetes mellitus without complication (HCC)    GERD (gastroesophageal reflux disease)    Headache    HLD (hyperlipidemia)    Hypertension    Hypothyroidism    MD (muscular dystrophy) (HCC)    Menopause    Muscular dystrophy, limb girdle (HCC)    Osteopenia    Recurrent UTI    Stroke (HCC) 02/2019   TIA   Thyroid disease    Tobacco abuse     Past Surgical History: Past Surgical History:  Procedure Laterality Date   COLONOSCOPY WITH PROPOFOL N/A 04/25/2018   Procedure: COLONOSCOPY WITH PROPOFOL;  Surgeon: Christena Deem, MD;  Location: [facility] ENDOSCOPY;  Service: Endoscopy;  Laterality: N/A;   ENDARTERECTOMY Right 08/11/2019   Procedure: ENDARTERECTOMY CAROTID;  Surgeon: Renford Dills, MD;  Location: [facility] ORS;  Service: Vascular;  Laterality: Right;   NO PAST SURGERIES      Allergies: Allergies as of 02/03/2023 - Review Complete 02/03/2023  Allergen Reaction Noted   Latex Rash 04/09/2015   Penicillins Other (See Comments) 10/26/2013    Medications:  Current Outpatient Medications:     albuterol (VENTOLIN HFA) 108 (90 Base) MCG/ACT inhaler, Inhale 2 puffs into the lungs every 6 (six) hours as needed for wheezing or shortness of breath. , Disp: , Rfl:    ALPRAZolam (XANAX) 0.25 MG tablet, Take 0.25 mg by mouth at bedtime as needed for anxiety or sleep. , Disp: , Rfl:    atenolol (TENORMIN) 100 MG tablet, Take 200 mg by mouth daily. , Disp: , Rfl:    ezetimibe (ZETIA) 10 MG tablet, Take 10 mg by mouth at bedtime. , Disp: , Rfl:    ferrous sulfate 325 (65 FE) MG EC tablet, Take 325 mg by mouth daily with breakfast., Disp: , Rfl:    glimepiride (AMARYL) 1 MG tablet, Take 1 mg by mouth daily. , Disp: , Rfl:    HYDROcodone-acetaminophen (NORCO) 10-325 MG tablet, Take 1 tablet by mouth at bedtime as needed., Disp: , Rfl:    levothyroxine (SYNTHROID) 100 MCG tablet, Take 100 mcg by mouth daily before breakfast., Disp: , Rfl:    MAGNESIUM PO, Take 250 mg by mouth daily., Disp: , Rfl:    metFORMIN (GLUCOPHAGE) 500 MG tablet, Take 1,000 mg by mouth 2 (two) times daily with a meal. , Disp: , Rfl:    omeprazole (PRILOSEC) 40 MG capsule, Take 40 mg by mouth daily., Disp: , Rfl:    triamterene-hydrochlorothiazide (MAXZIDE-25) 37.5-25 MG tablet, Take 1 tablet by mouth daily., Disp: , Rfl:  vitamin B-12 (CYANOCOBALAMIN) 500 MCG tablet, Take 500 mcg by mouth daily., Disp: , Rfl:   Social History: Social History   Tobacco Use   Smoking status: Every Day    Current packs/day: 1.00    Types: Cigarettes   Smokeless tobacco: Never  Vaping Use   Vaping status: Never Used  Substance Use Topics   Alcohol use: No   Drug use: No    Family Medical History: Family History  Adopted: Yes  Problem Relation Age of Onset   Breast cancer Mother    Colon cancer Father    Diabetes Father     Physical Examination: Vitals:   02/03/23 1335  BP: 116/72    General: Patient is in no apparent distress. Attention to examination is appropriate.  Neck:   Supple.  Full range of  motion.  Respiratory: Patient is breathing without any difficulty.   NEUROLOGICAL:     Awake, alert, oriented to person, place, and time.  Speech is clear and fluent.   Cranial Nerves: Pupils equal round and reactive to light.  Facial tone is symmetric.  Facial sensation is symmetric. Shoulder shrug is symmetric. Tongue protrusion is midline.    Strength: She has good antigravity strength in the bilateral lower extremities.  She does have significant muscle wasting in the proximal lower extremities consistent with her diagnosis of MD, she does state that she has not had any progressive muscle loss since this injury        Imaging: No new imaging during this visit  I have personally reviewed the images and agree with the above interpretation.  Medical Decision Making/Assessment and Plan: [name] is a pleasant 62 y.o. female with a history of fall with a compression fracture and epidural hematoma.  She was treated conservatively.  She did not have any neurologic symptoms at the time.  Overall she has had a significant improvement in her back pain.  No longer wearing her brace.  Not having any new neurologic deficits.  She does have a history of muscular dystrophy.  She is not having any changes.  No new sensory changes.  No new bowel or bladder changes.  At this point she can continue to follow as needed.  Should she have worsening back pain would like to get further x-rays to evaluate for any kyphotic deformity.  Thank you for involving me in the care of this patient.    Lovenia Kim MD/MSCR Neurosurgery

## 2023-03-03 ENCOUNTER — Ambulatory Visit (INDEPENDENT_AMBULATORY_CARE_PROVIDER_SITE_OTHER): Payer: 59 | Admitting: Nurse Practitioner

## 2023-03-03 ENCOUNTER — Ambulatory Visit (INDEPENDENT_AMBULATORY_CARE_PROVIDER_SITE_OTHER): Payer: 59

## 2023-03-03 ENCOUNTER — Encounter (INDEPENDENT_AMBULATORY_CARE_PROVIDER_SITE_OTHER): Payer: Self-pay | Admitting: Nurse Practitioner

## 2023-03-03 VITALS — BP 132/64 | HR 62 | Resp 15 | Wt 119.0 lb

## 2023-03-03 DIAGNOSIS — I6523 Occlusion and stenosis of bilateral carotid arteries: Secondary | ICD-10-CM

## 2023-03-03 DIAGNOSIS — E1159 Type 2 diabetes mellitus with other circulatory complications: Secondary | ICD-10-CM

## 2023-03-03 DIAGNOSIS — Z720 Tobacco use: Secondary | ICD-10-CM | POA: Diagnosis not present

## 2023-03-03 DIAGNOSIS — I10 Essential (primary) hypertension: Secondary | ICD-10-CM

## 2023-03-04 ENCOUNTER — Encounter (INDEPENDENT_AMBULATORY_CARE_PROVIDER_SITE_OTHER): Payer: Self-pay

## 2023-03-04 DIAGNOSIS — Z7984 Long term (current) use of oral hypoglycemic drugs: Secondary | ICD-10-CM | POA: Diagnosis not present

## 2023-03-04 DIAGNOSIS — E119 Type 2 diabetes mellitus without complications: Secondary | ICD-10-CM | POA: Diagnosis not present

## 2023-03-04 DIAGNOSIS — H01006 Unspecified blepharitis left eye, unspecified eyelid: Secondary | ICD-10-CM | POA: Diagnosis not present

## 2023-03-04 DIAGNOSIS — H5203 Hypermetropia, bilateral: Secondary | ICD-10-CM | POA: Diagnosis not present

## 2023-03-04 DIAGNOSIS — H52223 Regular astigmatism, bilateral: Secondary | ICD-10-CM | POA: Diagnosis not present

## 2023-03-04 DIAGNOSIS — H524 Presbyopia: Secondary | ICD-10-CM | POA: Diagnosis not present

## 2023-03-04 DIAGNOSIS — H01003 Unspecified blepharitis right eye, unspecified eyelid: Secondary | ICD-10-CM | POA: Diagnosis not present

## 2023-03-14 ENCOUNTER — Encounter (INDEPENDENT_AMBULATORY_CARE_PROVIDER_SITE_OTHER): Payer: Self-pay | Admitting: Nurse Practitioner

## 2023-03-14 NOTE — Progress Notes (Signed)
Subjective:    Patient ID: Andrea Rogers, female    DOB: [birth year], 62 y.o.   MRN: [id number] Chief Complaint  Patient presents with   Follow-up    1 yr carotid ultrasound     The patient is seen for follow up evaluation of carotid stenosis. The carotid stenosis followed by ultrasound.   The patient denies amaurosis fugax. There is no recent history of TIA symptoms or focal motor deficits. There is no prior documented CVA.  The patient is taking enteric-coated aspirin 81 mg daily.  There is no history of migraine headaches. There is no history of seizures.  No recent shortening of the patient's walking distance or new symptoms consistent with claudication.  No history of rest pain symptoms. No new ulcers or wounds of the lower extremities have occurred.  There is no history of DVT, PE or superficial thrombophlebitis. No documented recent episodes of angina or shortness of breath documented.   Today's studies show 1 to 39% stenosis of the right ICA with 40 to 59% stenosis of the left T8.  Antegrade flow in bilateral vertebral arteries.  Previous duplex ultrasound shows 1 to 39% stenosis of the right carotid artery with 60 to 79% stenosis of the left.    Review of Systems  All other systems reviewed and are negative.      Objective:   Physical Exam Vitals reviewed.  HENT:     Head: Normocephalic.  Neck:     Vascular: Carotid bruit present.  Cardiovascular:     Rate and Rhythm: Normal rate.     Pulses: Normal pulses.  Pulmonary:     Effort: Pulmonary effort is normal.  Skin:    General: Skin is warm and dry.  Neurological:     Mental Status: She is alert and oriented to person, place, and time.  Psychiatric:        Mood and Affect: Mood normal.        Behavior: Behavior normal.        Thought Content: Thought content normal.        Judgment: Judgment normal.     BP 132/64 (BP Location: Left Arm)   Pulse 62   Resp 15   Wt 119 lb (54 kg)   LMP  (LMP Unknown)    BMI 20.43 kg/m   Past Medical History:  Diagnosis Date   Anal condyloma    Arthritis    Bronchitis    Diabetes mellitus without complication (HCC)    GERD (gastroesophageal reflux disease)    Headache    HLD (hyperlipidemia)    Hypertension    Hypothyroidism    MD (muscular dystrophy) (HCC)    Menopause    Muscular dystrophy, limb girdle (HCC)    Osteopenia    Recurrent UTI    Stroke (HCC) 02/2019   TIA   Thyroid disease    Tobacco abuse     Social History   Socioeconomic History   Marital status: Married    Spouse name: Not on file   Number of children: Not on file   Years of education: Not on file   Highest education level: Not on file  Occupational History   Not on file  Tobacco Use   Smoking status: Every Day    Current packs/day: 1.00    Types: Cigarettes   Smokeless tobacco: Never  Vaping Use   Vaping status: Never Used  Substance and Sexual Activity   Alcohol use: No   Drug use:  No   Sexual activity: Not Currently    Birth control/protection: Post-menopausal  Other Topics Concern   Not on file  Social History Narrative   Not on file   Social Determinants of Health   Financial Resource Strain: Low Risk  (08/13/2022)   Received from [facility]   Overall Financial Resource Strain (CARDIA)    Difficulty of Paying Living Expenses: Not hard at all  Food Insecurity: No Food Insecurity (12/01/2022)   Hunger Vital Sign    Worried About Running Out of Food in the Last Year: Never true    Ran Out of Food in the Last Year: Never true  Transportation Needs: No Transportation Needs (12/01/2022)   PRAPARE - Administrator, Civil Service (Medical): No    Lack of Transportation (Non-Medical): No  Physical Activity: Inactive (04/14/2017)   Exercise Vital Sign    Days of Exercise per Week: 0 days    Minutes of Exercise per Session: 0 min  Stress: Not on file  Social Connections: Not on file  Intimate  Partner Violence: Not At Risk (12/01/2022)   Humiliation, Afraid, Rape, and Kick questionnaire    Fear of Current or Ex-Partner: No    Emotionally Abused: No    Physically Abused: No    Sexually Abused: No    Past Surgical History:  Procedure Laterality Date   COLONOSCOPY WITH PROPOFOL N/A 04/25/2018   Procedure: COLONOSCOPY WITH PROPOFOL;  Surgeon: Christena Deem, MD;  Location: [facility] ENDOSCOPY;  Service: Endoscopy;  Laterality: N/A;   ENDARTERECTOMY Right 08/11/2019   Procedure: ENDARTERECTOMY CAROTID;  Surgeon: Renford Dills, MD;  Location: [facility] ORS;  Service: Vascular;  Laterality: Right;   NO PAST SURGERIES      Family History  Adopted: Yes  Problem Relation Age of Onset   Breast cancer Mother    Colon cancer Father    Diabetes Father     Allergies  Allergen Reactions   Latex Rash   Penicillins Other (See Comments)    Yeast infection Did it involve swelling of the face/tongue/throat, SOB, or low BP? No Did it involve sudden or severe rash/hives, skin peeling, or any reaction on the inside of your mouth or nose? No Did you need to seek medical attention at a hospital or doctor's office? No When did it last happen?       If all above answers are "NO", may proceed with cephalosporin use.        Latest Ref Rng & Units 12/02/2022    4:35 AM 12/01/2022    9:05 PM 08/12/2019    4:26 AM  CBC  WBC 4.0 - 10.5 K/uL 10.5  10.4  10.4   Hemoglobin 12.0 - 15.0 g/dL 53.6  64.4  03.4   Hematocrit 36.0 - 46.0 % 42.5  45.5  33.9   Platelets 150 - 400 K/uL 231  251  227       CMP     Component Value Date/Time   NA 133 (L) 12/02/2022 0435   K 3.8 12/02/2022 0435   CL 97 (L) 12/02/2022 0435   CO2 24 12/02/2022 0435   GLUCOSE 115 (H) 12/02/2022 0435   BUN 27 (H) 12/02/2022 0435   CREATININE 0.70 12/02/2022 0435   CALCIUM 9.1 12/02/2022 0435   PROT 7.1 12/01/2022 2105   ALBUMIN 4.0 12/01/2022 2105   AST 18 12/01/2022 2105   ALT 20 12/01/2022 2105   ALKPHOS 49  12/01/2022 2105  BILITOT 0.7 12/01/2022 2105   GFRNONAA >60 12/02/2022 0435     No results found.     Assessment & Plan:   1. Bilateral carotid artery stenosis Recommend:   Given the patient's asymptomatic subcritical stenosis no further invasive testing or surgery at this time.   Duplex ultrasound shows 1 to 39% stenosis of the right ICA with 40 to 59% stenosis of left ICA, with velocities on the higher end.   Continue antiplatelet therapy as prescribed Continue management of CAD, HTN and Hyperlipidemia Healthy heart diet,  encouraged exercise at least 4 times per week Follow up in 9 months with duplex ultrasound and physical exam   2. Tobacco abuse Smoking cessation was discussed, 3-10 minutes spent on this topic specifically  3. Controlled type 2 diabetes mellitus with other circulatory complication, without long-term current use of insulin (HCC) Continue hypoglycemic medications as already ordered, these medications have been reviewed and there are no changes at this time.  Hgb A1C to be monitored as already arranged by primary service  4. Essential hypertension Continue antihypertensive medications as already ordered, these medications have been reviewed and there are no changes at this time.   Current Outpatient Medications on File Prior to Visit  Medication Sig Dispense Refill   ALPRAZolam (XANAX) 0.25 MG tablet Take 0.25 mg by mouth at bedtime as needed for anxiety or sleep.      atenolol (TENORMIN) 100 MG tablet Take 200 mg by mouth daily.      ezetimibe (ZETIA) 10 MG tablet Take 10 mg by mouth at bedtime.      ferrous sulfate 325 (65 FE) MG EC tablet Take 325 mg by mouth daily with breakfast.     glimepiride (AMARYL) 1 MG tablet Take 1 mg by mouth daily.      HYDROcodone-acetaminophen (NORCO) 10-325 MG tablet Take 1 tablet by mouth at bedtime as needed.     levothyroxine (SYNTHROID) 100 MCG tablet Take 100 mcg by mouth daily before breakfast.     MAGNESIUM PO  Take 250 mg by mouth daily.     metFORMIN (GLUCOPHAGE) 500 MG tablet Take 1,000 mg by mouth 2 (two) times daily with a meal.      omeprazole (PRILOSEC) 40 MG capsule Take 40 mg by mouth daily.     triamterene-hydrochlorothiazide (MAXZIDE-25) 37.5-25 MG tablet Take 1 tablet by mouth daily.     vitamin B-12 (CYANOCOBALAMIN) 500 MCG tablet Take 500 mcg by mouth daily.     albuterol (VENTOLIN HFA) 108 (90 Base) MCG/ACT inhaler Inhale 2 puffs into the lungs every 6 (six) hours as needed for wheezing or shortness of breath.      No current facility-administered medications on file prior to visit.    There are no Patient Instructions on file for this visit. No follow-ups on file.   Georgiana Spinner, NP

## 2023-03-16 ENCOUNTER — Encounter: Payer: Self-pay | Admitting: Obstetrics & Gynecology

## 2023-03-16 ENCOUNTER — Ambulatory Visit (INDEPENDENT_AMBULATORY_CARE_PROVIDER_SITE_OTHER): Payer: 59 | Admitting: Obstetrics & Gynecology

## 2023-03-16 VITALS — BP 166/63 | Ht 64.0 in | Wt 120.0 lb

## 2023-03-16 DIAGNOSIS — N950 Postmenopausal bleeding: Secondary | ICD-10-CM | POA: Diagnosis not present

## 2023-03-16 DIAGNOSIS — R102 Pelvic and perineal pain: Secondary | ICD-10-CM

## 2023-03-16 MED ORDER — VALACYCLOVIR HCL 1 G PO TABS: 1000.0000 mg | ORAL_TABLET | Freq: Every day | ORAL | 6 refills | Status: DC

## 2023-03-16 NOTE — Progress Notes (Signed)
    GYNECOLOGY PROGRESS NOTE  Subjective:    Patient ID: Andrea Rogers, female    DOB: 01/31/1961, 62 y.o.   MRN: [id number]  HPI  Patient is a 62 y.o. married G0P0000 here as a new patient with the issue of PMB for the last 5 days. She reports a "sore place" on her vulva and she thinks that the bleeding may be coming from that. She says that she has had a "sore place" there on occasions in the past but it spontaneously resolves. She was told by a gynecologist many years ago that she has herpes but she was not inclined to believe him. She reports menopause about 10 years ago.  She has not had sex for several years due to dyspareunia.    The following portions of the patient's history were reviewed and updated as appropriate: allergies, current medications, past family history, past medical history, past social history, past surgical history, and problem list.  Review of Systems Pertinent items are noted in HPI.  Pap normal 2022  Objective:   Blood pressure (!) 166/63, height 5\' 4"  (1.626 m), weight 120 lb (54.4 kg). Body mass index is 20.6 kg/m. Well nourished, well hydrated White female, no apparent distress She is ambulating and conversing normally. EG- labia have fused over her clitorus Extreme VVA Vaginal opening so small that she could not tolerate the pediatric speculum or a digital exam.   Assessment:   PMB Vulvar pain, intermittent, h/o herpes  Plan:   Schedule gyn ultrasound (She is thin enough that abdominal approach will likely allow visualization of the uterus. If endometrium 5mm or more, then rec EMBX  Valtrex with refills ordered

## 2023-03-30 ENCOUNTER — Ambulatory Visit: Payer: 59

## 2023-04-05 ENCOUNTER — Ambulatory Visit
Admission: RE | Admit: 2023-04-05 | Discharge: 2023-04-05 | Disposition: A | Discharge status: Home / Self Care | Payer: 59 | Source: Ambulatory Visit | Attending: Obstetrics & Gynecology | Admitting: Obstetrics & Gynecology

## 2023-04-05 ENCOUNTER — Ambulatory Visit: Payer: 59 | Admitting: Obstetrics & Gynecology

## 2023-04-05 DIAGNOSIS — N950 Postmenopausal bleeding: Secondary | ICD-10-CM | POA: Insufficient documentation

## 2023-04-12 ENCOUNTER — Encounter: Payer: Self-pay | Admitting: Obstetrics & Gynecology

## 2023-04-12 ENCOUNTER — Ambulatory Visit (INDEPENDENT_AMBULATORY_CARE_PROVIDER_SITE_OTHER): Payer: 59 | Admitting: Obstetrics & Gynecology

## 2023-04-12 VITALS — BP 135/68 | HR 65 | Wt 117.0 lb

## 2023-04-12 DIAGNOSIS — N950 Postmenopausal bleeding: Secondary | ICD-10-CM

## 2023-04-12 NOTE — Progress Notes (Signed)
    GYNECOLOGY PROGRESS NOTE  Subjective:    Patient ID: Andrea Rogers, female    DOB: 1960/10/03, 62 y.o.   MRN: [id number]  HPI  Patient is a 62 y.o. married G0 is here to discuss her pelvic ultrasound results. It was done a week ago for evaluation of PMB. She had 5 days of bleeding last month and none since.  Her ultrasound report has not been resulted as of yet. I had the ultrasound tech here today review the images and she measured the endometrium to be 8 mm.  The following portions of the patient's history were reviewed and updated as appropriate: allergies, current medications, past family history, past medical history, past social history, past surgical history, and problem list.  Review of Systems Pertinent items are noted in HPI.   Objective:   Blood pressure 135/68, pulse 65, weight 117 lb (53.1 kg). Body mass index is 20.08 kg/m. Well nourished, well hydrated White female, no apparent distress She is conversing normally. She ambulates with difficulty, assisted by her husband. Per her exam last month, her vaginal introitus was so small that she could not tolerate a pediatric speculum. Her ultrasound was done with abdominal approach as she could not tolerate the vaginal probe.   Assessment:   PMB- need for endometrial sampling. I discussed EMBX in the office versus d&c (possible hysteroscopy). She opts for sampling to be done in OR with anesthesia.  Plan:   She will have a surgical consult with Dr. Valentino Saxon (whom she has seen in the past).

## 2023-04-21 DIAGNOSIS — Z1231 Encounter for screening mammogram for malignant neoplasm of breast: Secondary | ICD-10-CM | POA: Diagnosis not present

## 2023-04-29 ENCOUNTER — Encounter: Payer: Self-pay | Admitting: Obstetrics and Gynecology

## 2023-04-29 ENCOUNTER — Ambulatory Visit: Payer: 59 | Admitting: Obstetrics and Gynecology

## 2023-04-29 VITALS — BP 162/66 | HR 60 | Resp 16 | Ht 64.0 in | Wt 119.5 lb

## 2023-04-29 DIAGNOSIS — N950 Postmenopausal bleeding: Secondary | ICD-10-CM | POA: Diagnosis not present

## 2023-04-29 DIAGNOSIS — R9389 Abnormal findings on diagnostic imaging of other specified body structures: Secondary | ICD-10-CM

## 2023-04-29 DIAGNOSIS — Z01818 Encounter for other preprocedural examination: Secondary | ICD-10-CM | POA: Diagnosis not present

## 2023-04-29 DIAGNOSIS — I10 Essential (primary) hypertension: Secondary | ICD-10-CM

## 2023-04-29 NOTE — Addendum Note (Signed)
Addended by: Fabian November on: 04/29/2023 10:21 PM   Modules accepted: Orders

## 2023-04-29 NOTE — Progress Notes (Signed)
GYNECOLOGY PROGRESS NOTE  Subjective:    Patient ID: Andrea Rogers, female    DOB: 11-14-1960, 62 y.o.   MRN: [id number]  HPI  Patient is a 62 y.o. G0P0000 female who presents for surgery consult. She was evaluated by Dr. Marice Potter about 1 month ago for postmenopausal bleeding. She had an ultrasound performed due to complaints of an episode of PMB that lasted ~ 5 days in October,  and resulted showed endometrial thickening at 8 mm. Endometrial biopsy was attempted, but unable to obtain sample because of pain. Dr.Dove discussed surgical options with patient and she desires to have procedure done under anesthesia. She has a history of  limb-girdle muscular dystrophy, which requires consult with neurologist before anesthesia.   The following portions of the patient's history were reviewed and updated as appropriate: allergies, current medications, past family history, past medical history, past social history, past surgical history, and problem list.  Review of Systems A comprehensive review of systems was negative except for: Neurological: positive for vision problems - reports seeing occasional squiggle lines going across her line of sight in both eyes. States that she had this symptom in the past, and was initially diagnosed with ocular migraine, however later was found to have had a TIA.  Recently has been having current symptoms for the past several weeks. Does note that neurologic workup a few months ago including ultrasound and eye exam were normal.    Objective:   Blood pressure (!) 190/77, pulse 64, resp. rate 16, height 5\' 4"  (1.626 m), weight 119 lb 8 oz (54.2 kg). Body mass index is 20.51 kg/m.  Repeat BP 162/66 . General appearance: alert, cooperative, and no distress Abdomen: soft, non-tender; bowel sounds normal; no masses,  no organomegaly Pelvic: deferred   Imaging:  US PELVIS (TRANSABDOMINAL ONLY) CLINICAL DATA:  PMB  EXAM: TRANSABDOMINAL ULTRASOUND OF  PELVIS  TECHNIQUE: Transabdominalultrasound examination of the pelvis was performed including evaluation of the uterus, ovaries, adnexal regions, and pelvic cul-de-sac. At the patient's request, transvaginal imaging was not performed, and this study is very limited.  COMPARISON:  None Available.  FINDINGS: Uterusanteverted, 5 x 3 x 2 cm. The endometrium not visualized. The uterine cavity is empty. There are no uterine masses.  Right ovary  Unremarkable, 1.6 x 1.3 x 0.8 cm.  Left ovary  Unremarkable, 1.4 x 1.2 x 1.1 cm.  Images of the adnexae demonstrated no masses or fluid collections. Color Doppler demonstrated ovarian blood flow.  IMPRESSION: Very limited study. The endometrium was not visualized. No adnexal pathology.  Electronically Signed   By: Layla Maw M.D.   On: 04/22/2023 11:23   Assessment:   1. PMB (postmenopausal bleeding)   2. Thickened endometrium      Plan:   - Patient to undergo  surgical evaluation of thickened endometrium I the setting of post-menopausal bleeding with Hysteroscopy D&C.  The risks of surgery were discussed in detail with the patient including but not limited to: bleeding which may require transfusion or reoperation; infection which may require prolonged hospitalization or re-hospitalization and antibiotic therapy; injury to bowel, bladder, or other surrounding organs which may lead to other procedures; formation of adhesions; need for additional procedures including laparotomy or subsequent procedures secondary to intraoperative injury or abnormal pathology; thromboembolic phenomenon; incisional problems and other postoperative or anesthesia complications.  Patient was told that the likelihood that her condition and symptoms will be treated effectively with this surgical management was very high; the postoperative expectations were also  discussed in detail. The patient also understands the alternative treatment options which were  discussed in full. All questions were answered.  She was told that she will be contacted by our surgical scheduler regarding the time and date of her surgery; routine preoperative instructions will be given to her by the preoperative nursing team. Patient education handouts included in the patient's AVS for patient to review at home.  - Will seek medical clearance from patient's PCP and Neurologist.   - Discussed elevated BPs, patient notes that these values are unusual for her, are typically well controlled. Advised to discuss with her PCP as she has also been noting some visual changes lately. Patient notes her PCP may want to change her blood pressure medications.    A total of 25 minutes were spent during this encounter, including review of previous progress notes, recent imaging and labs, face-to-face with time with patient involving counseling and coordination of care, as well as documentation for current visit.      Hildred Laser, MD [facility]

## 2023-05-04 DIAGNOSIS — G2581 Restless legs syndrome: Secondary | ICD-10-CM | POA: Diagnosis not present

## 2023-05-04 DIAGNOSIS — G71039 Limb girdle muscular dystrophy, unspecified: Secondary | ICD-10-CM | POA: Diagnosis not present

## 2023-05-04 DIAGNOSIS — E1165 Type 2 diabetes mellitus with hyperglycemia: Secondary | ICD-10-CM | POA: Diagnosis not present

## 2023-05-04 DIAGNOSIS — S32010A Wedge compression fracture of first lumbar vertebra, initial encounter for closed fracture: Secondary | ICD-10-CM | POA: Diagnosis not present

## 2023-05-04 DIAGNOSIS — E039 Hypothyroidism, unspecified: Secondary | ICD-10-CM | POA: Diagnosis not present

## 2023-05-04 DIAGNOSIS — Z720 Tobacco use: Secondary | ICD-10-CM | POA: Diagnosis not present

## 2023-05-04 DIAGNOSIS — Z0000 Encounter for general adult medical examination without abnormal findings: Secondary | ICD-10-CM | POA: Diagnosis not present

## 2023-05-04 DIAGNOSIS — Z9181 History of falling: Secondary | ICD-10-CM | POA: Diagnosis not present

## 2023-05-05 ENCOUNTER — Encounter: Payer: Self-pay | Admitting: Obstetrics and Gynecology

## 2023-05-11 DIAGNOSIS — Z720 Tobacco use: Secondary | ICD-10-CM | POA: Diagnosis not present

## 2023-05-11 DIAGNOSIS — E1165 Type 2 diabetes mellitus with hyperglycemia: Secondary | ICD-10-CM | POA: Diagnosis not present

## 2023-05-11 DIAGNOSIS — Z01818 Encounter for other preprocedural examination: Secondary | ICD-10-CM | POA: Diagnosis not present

## 2023-05-11 DIAGNOSIS — E039 Hypothyroidism, unspecified: Secondary | ICD-10-CM | POA: Diagnosis not present

## 2023-05-11 DIAGNOSIS — R808 Other proteinuria: Secondary | ICD-10-CM | POA: Diagnosis not present

## 2023-05-11 DIAGNOSIS — G71039 Limb girdle muscular dystrophy, unspecified: Secondary | ICD-10-CM | POA: Diagnosis not present

## 2023-05-11 DIAGNOSIS — I10 Essential (primary) hypertension: Secondary | ICD-10-CM | POA: Diagnosis not present

## 2023-05-11 DIAGNOSIS — G43109 Migraine with aura, not intractable, without status migrainosus: Secondary | ICD-10-CM | POA: Diagnosis not present

## 2023-05-12 ENCOUNTER — Other Ambulatory Visit: Payer: Self-pay | Admitting: Internal Medicine

## 2023-05-12 DIAGNOSIS — R808 Other proteinuria: Secondary | ICD-10-CM

## 2023-05-17 ENCOUNTER — Ambulatory Visit
Admission: RE | Admit: 2023-05-17 | Discharge: 2023-05-17 | Disposition: A | Discharge status: Home / Self Care | Payer: 59 | Source: Ambulatory Visit | Attending: Internal Medicine | Admitting: Internal Medicine

## 2023-05-17 DIAGNOSIS — R809 Proteinuria, unspecified: Secondary | ICD-10-CM | POA: Diagnosis not present

## 2023-05-17 DIAGNOSIS — R808 Other proteinuria: Secondary | ICD-10-CM | POA: Diagnosis not present

## 2023-06-14 ENCOUNTER — Encounter: Payer: Self-pay | Admitting: Obstetrics and Gynecology

## 2023-07-05 ENCOUNTER — Other Ambulatory Visit: Payer: Self-pay

## 2023-07-05 ENCOUNTER — Encounter: Payer: Self-pay | Admitting: Obstetrics and Gynecology

## 2023-07-05 ENCOUNTER — Encounter
Admission: RE | Admit: 2023-07-05 | Discharge: 2023-07-05 | Disposition: A | Discharge status: Home / Self Care | Payer: 59 | Source: Ambulatory Visit | Attending: Obstetrics and Gynecology | Admitting: Obstetrics and Gynecology

## 2023-07-05 DIAGNOSIS — Z01812 Encounter for preprocedural laboratory examination: Secondary | ICD-10-CM

## 2023-07-05 DIAGNOSIS — E1159 Type 2 diabetes mellitus with other circulatory complications: Secondary | ICD-10-CM

## 2023-07-05 DIAGNOSIS — G459 Transient cerebral ischemic attack, unspecified: Secondary | ICD-10-CM

## 2023-07-05 HISTORY — DX: Restless legs syndrome: G25.81

## 2023-07-05 HISTORY — DX: Postmenopausal bleeding: N95.0

## 2023-07-05 NOTE — Patient Instructions (Addendum)
 Your procedure is scheduled on:    MONDAY  FEB 17/2025 Report to the Registration Desk on the 1st floor of the [facility]. To find out your arrival time, please call [phone] between 1PM - 3PM on:       FRIDAY, 07/09/2023  If your arrival time is 6:00 am, do not arrive before that time as the [facility] entrance doors do not open until 6:00 am.  REMEMBER: Instructions that are not followed completely may result in serious medical risk, up to and including death; or upon the discretion of your surgeon and anesthesiologist your surgery may need to be rescheduled.  Do not eat or drink food after midnight the night before surgery.  No gum chewing or hard candies.    One week prior to surgery: Stop Anti-inflammatories (NSAIDS) such as Advil, Aleve, Ibuprofen, Motrin, Naproxen, Naprosyn and Aspirin  based products such as Excedrin, Goody's Powder, BC Powder. Stop ANY OVER THE COUNTER supplements until after surgery B-12, Vit D3, ferrous sulfate  You may however, continue to take Tylenol  if needed for pain up until the day of surgery.  YOU need to stop  metFORMIN (GLUCOPHAGE) 500 MG tablet Two days before surgery. Last dose is Feb 14   Continue taking all of your other prescription medications up until the day of surgery.  ON THE DAY OF SURGERY ONLY TAKE THESE MEDICATIONS WITH SIPS OF WATER:  levothyroxine  (SYNTHROID ) ALPRAZolam  (XANAX ) 3.   atenolol  (TENORMIN ) 4.   Do not take losartan on this day 5.   Do not take triamterene -hydrochlorothiazide  (MAXZIDE -25) on this day  6.   omeprazole (PRILOSEC) 7. Hydrocodone   8.  Do not take glimepiride on this day    No Alcohol  for 24 hours before or after surgery.  No Smoking including e-cigarettes for 24 hours before surgery.  No chewable tobacco products for at least 6 hours before surgery.  No nicotine  patches on the day of surgery.  Do not use any "recreational" drugs for at least a week (preferably 2 weeks) before your surgery.   Please be advised that the combination of cocaine and anesthesia may have negative outcomes, up to and including death. If you test positive for cocaine, your surgery will be cancelled.  On the morning of surgery brush your teeth with toothpaste and water, you may rinse your mouth with mouthwash if you wish. Do not swallow any toothpaste or mouthwash.  You need to shower on day of surgery  Do not wear jewelry, make-up, hairpins, clips or nail polish.  For welded (permanent) jewelry: bracelets, anklets, waist bands, etc.  Please have this removed prior to surgery.  If it is not removed, there is a chance that hospital personnel will need to cut it off on the day of surgery.  Do not wear lotions, powders, or perfumes.   Do not shave body hair from the neck down 48 hours before surgery.  Contact lenses, hearing aids and dentures may not be worn into surgery.  Do not bring valuables to the hospital. [facility] is not responsible for any missing/lost belongings or valuables.   Notify your doctor if there is any change in your medical condition (cold, fever, infection).  Wear comfortable clothing (specific to your surgery type) to the hospital.  After surgery, you can help prevent lung complications by doing breathing exercises.  Take deep breaths and cough every 1-2 hours. Your doctor may order a device called an Incentive Spirometer to help you take deep breaths.  If you  are being discharged the day of surgery, you will not be allowed to drive home. You will need a responsible individual to drive you home and stay with you for 24 hours after surgery.    Please call the Pre-admissions Testing Dept. at [phone] if you have any questions about these instructions.  Surgery Visitation Policy:  Patients having surgery or a procedure may have two visitors.  Children under the age of 90 must have an adult with them who is not the patient.  Temporary Visitor Restrictions Due to  increasing cases of flu, RSV and COVID-19: Children ages 18 and under will not be able to visit patients in [facility] hospitals under most circumstances.  Inpatient Visitation:    Visiting hours are 7 a.m. to 8 p.m. Up to four visitors are allowed at one time in a patient room. The visitors may rotate out with other people during the day.  One visitor age 84 or older may stay with the patient overnight and must be in the room by 8 p.m.

## 2023-07-06 ENCOUNTER — Encounter
Admission: RE | Admit: 2023-07-06 | Discharge: 2023-07-06 | Disposition: A | Discharge status: Home / Self Care | Payer: 59 | Source: Ambulatory Visit | Attending: Obstetrics and Gynecology | Admitting: Obstetrics and Gynecology

## 2023-07-06 DIAGNOSIS — G459 Transient cerebral ischemic attack, unspecified: Secondary | ICD-10-CM | POA: Insufficient documentation

## 2023-07-06 DIAGNOSIS — Z01810 Encounter for preprocedural cardiovascular examination: Secondary | ICD-10-CM | POA: Diagnosis not present

## 2023-07-06 DIAGNOSIS — Z01818 Encounter for other preprocedural examination: Secondary | ICD-10-CM | POA: Diagnosis not present

## 2023-07-06 DIAGNOSIS — Z01812 Encounter for preprocedural laboratory examination: Secondary | ICD-10-CM

## 2023-07-06 LAB — CBC
HCT: 47.7 % — ABNORMAL HIGH (ref 36.0–46.0)
Hemoglobin: 15.9 g/dL — ABNORMAL HIGH (ref 12.0–15.0)
MCH: 33 pg (ref 26.0–34.0)
MCHC: 33.3 g/dL (ref 30.0–36.0)
MCV: 99 fL (ref 80.0–100.0)
Platelets: 290 10*3/uL (ref 150–400)
RBC: 4.82 MIL/uL (ref 3.87–5.11)
RDW: 13.6 % (ref 11.5–15.5)
WBC: 10.1 10*3/uL (ref 4.0–10.5)
nRBC: 0 % (ref 0.0–0.2)

## 2023-07-12 ENCOUNTER — Encounter: Payer: Self-pay | Admitting: Obstetrics and Gynecology

## 2023-07-12 ENCOUNTER — Encounter
Admission: RE | Disposition: A | Discharge status: Home / Self Care | Payer: Self-pay | Source: Home / Self Care | Attending: Obstetrics and Gynecology

## 2023-07-12 ENCOUNTER — Ambulatory Visit: Payer: 59 | Admitting: Urgent Care

## 2023-07-12 ENCOUNTER — Ambulatory Visit
Admission: RE | Admit: 2023-07-12 | Discharge: 2023-07-12 | Disposition: A | Discharge status: Home / Self Care | Payer: 59 | Attending: Obstetrics and Gynecology | Admitting: Obstetrics and Gynecology

## 2023-07-12 ENCOUNTER — Other Ambulatory Visit: Payer: Self-pay

## 2023-07-12 DIAGNOSIS — F17200 Nicotine dependence, unspecified, uncomplicated: Secondary | ICD-10-CM | POA: Diagnosis not present

## 2023-07-12 DIAGNOSIS — K219 Gastro-esophageal reflux disease without esophagitis: Secondary | ICD-10-CM | POA: Insufficient documentation

## 2023-07-12 DIAGNOSIS — N895 Stricture and atresia of vagina: Secondary | ICD-10-CM | POA: Diagnosis not present

## 2023-07-12 DIAGNOSIS — N950 Postmenopausal bleeding: Secondary | ICD-10-CM | POA: Insufficient documentation

## 2023-07-12 DIAGNOSIS — N8500 Endometrial hyperplasia, unspecified: Secondary | ICD-10-CM | POA: Diagnosis not present

## 2023-07-12 DIAGNOSIS — F17210 Nicotine dependence, cigarettes, uncomplicated: Secondary | ICD-10-CM | POA: Diagnosis not present

## 2023-07-12 DIAGNOSIS — I10 Essential (primary) hypertension: Secondary | ICD-10-CM | POA: Diagnosis not present

## 2023-07-12 DIAGNOSIS — Z7984 Long term (current) use of oral hypoglycemic drugs: Secondary | ICD-10-CM | POA: Diagnosis not present

## 2023-07-12 DIAGNOSIS — R9389 Abnormal findings on diagnostic imaging of other specified body structures: Secondary | ICD-10-CM

## 2023-07-12 DIAGNOSIS — G7100 Muscular dystrophy, unspecified: Secondary | ICD-10-CM | POA: Diagnosis not present

## 2023-07-12 DIAGNOSIS — E119 Type 2 diabetes mellitus without complications: Secondary | ICD-10-CM | POA: Diagnosis not present

## 2023-07-12 HISTORY — DX: Type 2 diabetes mellitus without complications: E11.9

## 2023-07-12 HISTORY — PX: HYSTEROSCOPY WITH D & C: SHX1775

## 2023-07-12 LAB — GLUCOSE, CAPILLARY
Glucose-Capillary: 164 mg/dL — ABNORMAL HIGH (ref 70–99)
Glucose-Capillary: 148 mg/dL — ABNORMAL HIGH (ref 70–99)

## 2023-07-12 SURGERY — DILATATION AND CURETTAGE /HYSTEROSCOPY
Anesthesia: General | Site: Vagina

## 2023-07-12 MED ORDER — ESTROGENS CONJUGATED 0.625 MG/GM VA CREA
TOPICAL_CREAM | VAGINAL | Status: AC
  Filled 2023-07-12: qty 30

## 2023-07-12 MED ORDER — MIDAZOLAM HCL 2 MG/2ML IJ SOLN
INTRAMUSCULAR | Status: AC
Start: 2023-07-12 — End: ?
  Filled 2023-07-12: qty 2

## 2023-07-12 MED ORDER — CHLORHEXIDINE GLUCONATE 0.12 % MT SOLN
OROMUCOSAL | Status: AC
  Filled 2023-07-12: qty 15

## 2023-07-12 MED ORDER — EPHEDRINE 5 MG/ML INJ
INTRAVENOUS | Status: AC
  Filled 2023-07-12: qty 10

## 2023-07-12 MED ORDER — LIDOCAINE-EPINEPHRINE 1 %-1:100000 IJ SOLN
INTRAMUSCULAR | Status: AC
  Filled 2023-07-12: qty 1

## 2023-07-12 MED ORDER — ONDANSETRON HCL 4 MG/2ML IJ SOLN
INTRAMUSCULAR | Status: AC
Start: 2023-07-12 — End: ?
  Filled 2023-07-12: qty 2

## 2023-07-12 MED ORDER — ORAL CARE MOUTH RINSE: 15.0000 mL | Freq: Once | OROMUCOSAL | Status: AC

## 2023-07-12 MED ORDER — PROPOFOL 10 MG/ML IV BOLUS
INTRAVENOUS | Status: DC | PRN
  Administered 2023-07-12: 120 mg via INTRAVENOUS

## 2023-07-12 MED ORDER — EPHEDRINE SULFATE-NACL 50-0.9 MG/10ML-% IV SOSY
PREFILLED_SYRINGE | INTRAVENOUS | Status: DC | PRN
  Administered 2023-07-12 (×2): 10 mg via INTRAVENOUS
  Administered 2023-07-12: 5 mg via INTRAVENOUS
  Administered 2023-07-12: 10 mg via INTRAVENOUS

## 2023-07-12 MED ORDER — CHLORHEXIDINE GLUCONATE 0.12 % MT SOLN
15.0000 mL | Freq: Once | OROMUCOSAL | Status: AC
  Administered 2023-07-12: 15 mL via OROMUCOSAL

## 2023-07-12 MED ORDER — LIDOCAINE HCL (CARDIAC) PF 100 MG/5ML IV SOSY
PREFILLED_SYRINGE | INTRAVENOUS | Status: DC | PRN
Start: 2023-07-12 — End: 2023-07-12
  Administered 2023-07-12: 60 mg via INTRAVENOUS

## 2023-07-12 MED ORDER — SODIUM CHLORIDE 0.9 % IV SOLN: INTRAVENOUS | Status: DC

## 2023-07-12 MED ORDER — MIDAZOLAM HCL 2 MG/2ML IJ SOLN
INTRAMUSCULAR | Status: DC | PRN
  Administered 2023-07-12: 2 mg via INTRAVENOUS

## 2023-07-12 MED ORDER — GLYCOPYRROLATE 0.2 MG/ML IJ SOLN
INTRAMUSCULAR | Status: DC | PRN
  Administered 2023-07-12: .2 mg via INTRAVENOUS

## 2023-07-12 MED ORDER — DEXAMETHASONE SODIUM PHOSPHATE 10 MG/ML IJ SOLN
INTRAMUSCULAR | Status: AC
  Filled 2023-07-12: qty 1

## 2023-07-12 MED ORDER — PROPOFOL 10 MG/ML IV BOLUS
INTRAVENOUS | Status: AC
  Filled 2023-07-12: qty 20

## 2023-07-12 MED ORDER — DEXMEDETOMIDINE HCL IN NACL 80 MCG/20ML IV SOLN
INTRAVENOUS | Status: DC | PRN
  Administered 2023-07-12: 4 ug via INTRAVENOUS

## 2023-07-12 MED ORDER — PREMARIN 0.625 MG/GM VA CREA: 1.0000 | TOPICAL_CREAM | Freq: Every day | VAGINAL | Status: DC

## 2023-07-12 MED ORDER — ACETAMINOPHEN 500 MG PO TABS
1000.0000 mg | ORAL_TABLET | ORAL | Status: AC
  Administered 2023-07-12: 1000 mg via ORAL

## 2023-07-12 MED ORDER — SEVOFLURANE IN SOLN
RESPIRATORY_TRACT | Status: AC
Start: 2023-07-12 — End: ?
  Filled 2023-07-12: qty 250

## 2023-07-12 MED ORDER — ACETAMINOPHEN 500 MG PO TABS: 1000.0000 mg | ORAL_TABLET | Freq: Four times a day (QID) | ORAL | 0 refills | Status: DC | PRN

## 2023-07-12 MED ORDER — LACTATED RINGERS IV SOLN: INTRAVENOUS | Status: DC

## 2023-07-12 MED ORDER — GLYCOPYRROLATE 0.2 MG/ML IJ SOLN
INTRAMUSCULAR | Status: AC
  Filled 2023-07-12: qty 1

## 2023-07-12 MED ORDER — ONDANSETRON HCL 4 MG/2ML IJ SOLN
INTRAMUSCULAR | Status: DC | PRN
  Administered 2023-07-12: 4 mg via INTRAVENOUS

## 2023-07-12 MED ORDER — SODIUM CHLORIDE 0.9 % IR SOLN
Status: DC | PRN
  Administered 2023-07-12: 1

## 2023-07-12 MED ORDER — ACETAMINOPHEN 500 MG PO TABS
ORAL_TABLET | ORAL | Status: AC
  Filled 2023-07-12: qty 2

## 2023-07-12 MED ORDER — LIDOCAINE-EPINEPHRINE 1 %-1:100000 IJ SOLN
INTRAMUSCULAR | Status: DC | PRN
  Administered 2023-07-12: 10 mL

## 2023-07-12 MED ORDER — LIDOCAINE HCL (PF) 2 % IJ SOLN
INTRAMUSCULAR | Status: AC
  Filled 2023-07-12: qty 5

## 2023-07-12 MED ORDER — ESTROGENS CONJUGATED 0.625 MG/GM VA CREA
TOPICAL_CREAM | VAGINAL | Status: DC | PRN
  Administered 2023-07-12: 1 via VAGINAL

## 2023-07-12 MED ORDER — DEXAMETHASONE SODIUM PHOSPHATE 10 MG/ML IJ SOLN
INTRAMUSCULAR | Status: DC | PRN
  Administered 2023-07-12: 10 mg via INTRAVENOUS

## 2023-07-12 MED ORDER — FENTANYL CITRATE (PF) 100 MCG/2ML IJ SOLN
INTRAMUSCULAR | Status: AC
Start: 2023-07-12 — End: ?
  Filled 2023-07-12: qty 2

## 2023-07-12 MED ORDER — FENTANYL CITRATE (PF) 100 MCG/2ML IJ SOLN: 25.0000 ug | INTRAMUSCULAR | Status: DC | PRN

## 2023-07-12 MED ORDER — DROPERIDOL 2.5 MG/ML IJ SOLN: 0.6250 mg | Freq: Once | INTRAMUSCULAR | Status: DC | PRN

## 2023-07-12 MED ORDER — FENTANYL CITRATE (PF) 100 MCG/2ML IJ SOLN
INTRAMUSCULAR | Status: DC | PRN
  Administered 2023-07-12: 25 ug via INTRAVENOUS

## 2023-07-12 SURGICAL SUPPLY — 18 items
DEVICE MYOSURE LITE (MISCELLANEOUS) IMPLANT
DRSG TELFA 3X8 NADH STRL (GAUZE/BANDAGES/DRESSINGS) IMPLANT
ELECT REM PT RETURN 9FT ADLT (ELECTROSURGICAL)
ELECTRODE REM PT RTRN 9FT ADLT (ELECTROSURGICAL) ×1 IMPLANT
GLOVE SURG PROTEXIS BL SZ6.5 (GLOVE) ×2
GLOVE SURG SYN 6.5 PF PI BL (GLOVE) IMPLANT
GOWN STRL REUS W/ TWL LRG LVL3 (GOWN DISPOSABLE) ×2 IMPLANT
IV NS IRRIG 3000ML ARTHROMATIC (IV SOLUTION) ×1 IMPLANT
KIT PROCEDURE FLUENT (KITS) IMPLANT
KIT TURNOVER CYSTO (KITS) ×1 IMPLANT
NDL HYPO 22X1.5 SAFETY MO (MISCELLANEOUS) IMPLANT
NEEDLE HYPO 22X1.5 SAFETY MO (MISCELLANEOUS) ×1
PACK DNC HYST (MISCELLANEOUS) ×1 IMPLANT
PAD PREP OB/GYN DISP 24X41 (PERSONAL CARE ITEMS) ×1 IMPLANT
SCRUB CHG 4% DYNA-HEX 4OZ (MISCELLANEOUS) ×1 IMPLANT
SEAL ROD LENS SCOPE MYOSURE (ABLATOR) ×1 IMPLANT
SET CYSTO W/LG BORE CLAMP LF (SET/KITS/TRAYS/PACK) IMPLANT
SYR 10ML LL (SYRINGE) IMPLANT

## 2023-07-12 NOTE — Op Note (Signed)
Procedure(s): HYSTEROSCOPY DILATATION AND CURETTAGE Procedure Note  CODEE BLOODWORTH female 63 y.o. 07/12/2023  Indications: The patient is a 62 y.o. G0P0000 female with postmenopausal bleeding, vaginal stenosis.   Pre-operative Diagnosis: Postmenopausal bleeding, vaginal stenosis  Post-operative Diagnosis: Same  Surgeon: Hildred Laser, MD  Assistants:  None  Anesthesia: General endotracheal anesthesia  Findings: The vagina was noted to be stenotic with labial agglutination Cervix with significant stenosis.  Uterus was sounded to 5 cm Unable to visualize tubal ostia bilaterally. Possible false track created.   Procedure Details: The patient was seen in the Holding Room. The risks, benefits, complications, treatment options, and expected outcomes were discussed with the patient.  The patient concurred with the proposed plan, giving informed consent.  The site of surgery properly noted/marked. The patient was taken to the Operating Room, identified as Enzo Bi and the procedure verified as Procedure(s) (LRB): HYSTEROSCOPY DILATATION AND CURETTAGE (N/A). A Time Out was held and the above information confirmed.  She was then placed under general anesthesia without difficulty. She was placed in the dorsal lithotomy position, and was prepped and draped in a sterile manner.  Moderate labial agglutination was noted, reduced manually with small amount of bleeding.  A straight catheterization was performed. A sterile pediatric speculum was inserted into the vagina and the cervix was grasped at the anterior lip using a single-toothed tenaculum.  The cervix was noted to have significant stenosis. Lacrimal dilators were initially used to dilate the cervix until a sound could be passed.  The uterus was sounded to 5 cm. Cervical dilation was continued until a 5 mm hysteroscope could be introduced into the uterus under direct visualization. The cavity was allowed to fill, and then the entire cavity was  explored with the findings described above. The hysteroscope was removed, and a sharp curette was then passed into the uterus and endometrial sampling was collected for pathology. The cervix was then injected circumferentially with 10 ml of 1% lidocaine. The tenaculum was removed and excellent hemostasis was noted. The speculum was removed from the vagina. Premarin cream was placed at the introitus.   All instrument and sponge counts were correct at the end of the procedure x 2.  The patient tolerated the procedure well.  She was awakened and taken to the PACU in stable condition.   Estimated Blood Loss:  minimal      Drains: straight catheterization prior to procedure with 20 ml of clear urine         Total IV Fluids:  800  ml  Fluid Deficit: 350 ml  Specimens: None           Implants: None         Complications:  None; patient tolerated the procedure well.         Disposition: PACU - hemodynamically stable.         Condition: stable    Hildred Laser, MD [facility]

## 2023-07-12 NOTE — Transfer of Care (Signed)
Immediate Anesthesia Transfer of Care Note  Patient: Andrea Rogers  Procedure(s) Performed: Procedure(s): HYSTEROSCOPY DILATATION AND CURETTAGE (N/A)  Patient Location: PACU  Anesthesia Type:General  Level of Consciousness: sedated  Airway & Oxygen Therapy: Patient Spontanous Breathing and Patient connected to face mask oxygen  Post-op Assessment: Report given to RN and Post -op Vital signs reviewed and stable  Post vital signs: Reviewed and stable  Last Vitals:  Vitals:   07/12/23 0640 07/12/23 0844  BP: (!) 166/63 131/74  Pulse:  66  Resp:  19  Temp:  (!) 36.2 C  SpO2:  100%    Complications: No apparent anesthesia complications

## 2023-07-12 NOTE — Anesthesia Preprocedure Evaluation (Signed)
Anesthesia Evaluation  Patient identified by MRN, date of birth, ID band Patient awake  General Assessment Comment:Never had general anesthetic, only colonoscopies  Reviewed: Allergy & Precautions, NPO status , Patient's Chart, lab work & pertinent test results  History of Anesthesia Complications Negative for: history of anesthetic complications  Airway Mallampati: II  TM Distance: >3 FB Neck ROM: Full    Dental no notable dental hx. (+) Teeth Intact, Dental Advisory Given   Pulmonary neg pulmonary ROS, neg shortness of breath, neg sleep apnea, neg COPD, neg recent URI, Current Smoker and Patient abstained from smoking.   Pulmonary exam normal breath sounds clear to auscultation       Cardiovascular Exercise Tolerance: Good METShypertension, Pt. on medications (-) angina (-) CAD, (-) Past MI and (-) Cardiac Stents (-) dysrhythmias  Rhythm:Regular Rate:Normal - Systolic murmurs    Neuro/Psych  Headaches, neg Seizures PSYCHIATRIC DISORDERS Anxiety     Limb girdle muscular dystrophy (biopsy confirmed); does not affect respiratory muscles to her knowledge TIA Neuromuscular disease  negative psych ROS   GI/Hepatic ,GERD  Controlled,,(+)     (-) substance abuse    Endo/Other  diabetes, Oral Hypoglycemic AgentsHypothyroidism    Renal/GU negative Renal ROS     Musculoskeletal   Abdominal   Peds  Hematology   Anesthesia Other Findings Past Medical History: No date: Anal condyloma No date: Arthritis No date: Bronchitis No date: Diabetes mellitus without complication (HCC) No date: GERD (gastroesophageal reflux disease) No date: Headache No date: HLD (hyperlipidemia) No date: Hypertension No date: Hypothyroidism No date: MD (muscular dystrophy) (HCC) No date: Menopause No date: Muscular dystrophy, limb girdle (HCC) No date: Osteopenia No date: Recurrent UTI 02/2019: Stroke (HCC)     Comment:  TIA No date: Thyroid  disease No date: Tobacco abuse  Reproductive/Obstetrics                             Anesthesia Physical Anesthesia Plan  ASA: 3  Anesthesia Plan: General   Post-op Pain Management:    Induction: Intravenous  PONV Risk Score and Plan: 2 and Ondansetron, Dexamethasone, Midazolam and Treatment may vary due to age or medical condition  Airway Management Planned: LMA  Additional Equipment: None  Intra-op Plan:   Post-operative Plan: Extubation in OR  Informed Consent: I have reviewed the patients History and Physical, chart, labs and discussed the procedure including the risks, benefits and alternatives for the proposed anesthesia with the patient or authorized representative who has indicated his/her understanding and acceptance.     Dental advisory given  Plan Discussed with: CRNA and Surgeon  Anesthesia Plan Comments: (Discussed risks of anesthesia with patient, including PONV, sore throat, lip/dental damage. Rare risks discussed as well, such as cardiorespiratory and neurological sequelae. Discussed with patient how we will address her hx of muscular dystrophy (TIVA, no volatile, no succinylcholine). Patient understands.)        Anesthesia Quick Evaluation

## 2023-07-12 NOTE — H&P (Signed)
GYNECOLOGY PREOPERATIVE HISTORY AND PHYSICAL   Subjective:  Andrea Rogers is a 63 y.o. G0P0000 here for surgical management of postmenopausal bleeding.  She was evaluated by Dr. Nicholaus Bloom in November for postmenopausal bleeding. She had an ultrasound performed due to complaints of an episode of PMB that lasted ~ 5 days in October, and resulted showed endometrial thickening at 8 mm. Endometrial biopsy was attempted, but unable to obtain sample because of pain. Dr.Dove discussed surgical options with patient and she desires to have procedure done under anesthesia. She has a history of  limb-girdle muscular dystrophy, which requires consult with neurologist before anesthesia..    Proposed surgery: Hysteroscopy Dilation and Curettage   Pertinent Gynecological History: Menses: post-menopausal Bleeding: post menopausal bleeding Contraception: post menopausal status Last mammogram: normal Date: 09/29/2019 Last pap: normal Date: 08/27/2020   Past Medical History:  Diagnosis Date   Anal condyloma    Arthritis    Bronchitis    GERD (gastroesophageal reflux disease)    Headache    HLD (hyperlipidemia)    Hypertension    Hypothyroidism    MD (muscular dystrophy) (HCC)    Menopause    Muscular dystrophy, limb girdle (HCC)    Osteopenia    Post-menopausal bleeding    Recurrent UTI    Restless leg syndrome    T2DM (type 2 diabetes mellitus) (HCC)    TIA (transient ischemic attack) 02/2019   Tobacco abuse     Past Surgical History:  Procedure Laterality Date   COLONOSCOPY WITH PROPOFOL N/A 04/25/2018   Procedure: COLONOSCOPY WITH PROPOFOL;  Surgeon: Christena Deem, MD;  Location: [facility] ENDOSCOPY;  Service: Endoscopy;  Laterality: N/A;   ENDARTERECTOMY Right 08/11/2019   Procedure: ENDARTERECTOMY CAROTID;  Surgeon: Renford Dills, MD;  Location: [facility] ORS;  Service: Vascular;  Laterality: Right;   NO PAST SURGERIES      OB History  Gravida Para Term Preterm AB Living  0 0 0 0 0 0   SAB IAB Ectopic Multiple Live Births  0 0 0 0 0     Family History  Adopted: Yes  Problem Relation Age of Onset   Breast cancer Mother    Colon cancer Father    Diabetes Father     Social History   Socioeconomic History   Marital status: Married    Spouse name: Not on file   Number of children: Not on file   Years of education: Not on file   Highest education level: Not on file  Occupational History   Not on file  Tobacco Use   Smoking status: Every Day    Current packs/day: 1.00    Types: Cigarettes   Smokeless tobacco: Never  Vaping Use   Vaping status: Never Used  Substance and Sexual Activity   Alcohol use: No   Drug use: No   Sexual activity: Not Currently    Birth control/protection: Post-menopausal  Other Topics Concern   Not on file  Social History Narrative   Lives at home with husband   Social Drivers of Health   Financial Resource Strain: Low Risk  (05/11/2023)   Received from [facility]   Overall Financial Resource Strain (CARDIA)    Difficulty of Paying Living Expenses: Not hard at all  Food Insecurity: No Food Insecurity (05/11/2023)   Received from [facility]   Hunger Vital Sign    Worried About Running Out of Food in the Last Year: Never true  Ran Out of Food in the Last Year: Never true  Transportation Needs: No Transportation Needs (05/11/2023)   Received from [facility] - Transportation    In the past 12 months, has lack of transportation kept you from medical appointments or from getting medications?: No    Lack of Transportation (Non-Medical): No  Physical Activity: Inactive (04/14/2017)   Exercise Vital Sign    Days of Exercise per Week: 0 days    Minutes of Exercise per Session: 0 min  Stress: Not on file  Social Connections: Not on file  Intimate Partner Violence: Not At Risk (12/01/2022)   Humiliation, Afraid, Rape, and Kick questionnaire    Fear of Current or  Ex-Partner: No    Emotionally Abused: No    Physically Abused: No    Sexually Abused: No    No current facility-administered medications on file prior to encounter.   Current Outpatient Medications on File Prior to Encounter  Medication Sig Dispense Refill   ALPRAZolam (XANAX) 0.25 MG tablet Take 0.25 mg by mouth at bedtime.     ALPRAZolam (XANAX) 0.5 MG tablet Take 0.5 mg by mouth at bedtime as needed for anxiety or sleep.     atenolol (TENORMIN) 100 MG tablet Take 100 mg by mouth daily.     cholecalciferol (VITAMIN D3) 25 MCG (1000 UNIT) tablet Take 1,000 Units by mouth daily.     docusate sodium (COLACE) 100 MG capsule Take 100 mg by mouth at bedtime.     erythromycin ophthalmic ointment Place 1 Application into both eyes daily as needed.     ezetimibe (ZETIA) 10 MG tablet Take 10 mg by mouth at bedtime.      ferrous sulfate 325 (65 FE) MG EC tablet Take 325 mg by mouth daily with breakfast.     glimepiride (AMARYL) 1 MG tablet Take 1 mg by mouth daily.      HYDROcodone-acetaminophen (NORCO) 7.5-325 MG tablet Take 1 tablet by mouth daily after supper.     levothyroxine (SYNTHROID) 100 MCG tablet Take 100 mcg by mouth daily before breakfast.     losartan (COZAAR) 50 MG tablet Take 50 mg by mouth daily.     MAGNESIUM PO Take 250 mg by mouth daily.     metFORMIN (GLUCOPHAGE) 500 MG tablet Take 1,000 mg by mouth 2 (two) times daily with a meal.      omeprazole (PRILOSEC) 20 MG capsule Take 20 mg by mouth daily.     triamterene-hydrochlorothiazide (MAXZIDE-25) 37.5-25 MG tablet Take 1 tablet by mouth daily.     vitamin B-12 (CYANOCOBALAMIN) 500 MCG tablet Take 500 mcg by mouth daily.     valACYclovir (VALTREX) 1000 MG tablet Take 1 tablet (1,000 mg total) by mouth daily. (Patient not taking: Reported on 06/30/2023) 10 tablet 6    Allergies  Allergen Reactions   Latex Rash   Penicillins Other (See Comments)    Yeast infection Did it involve swelling of the face/tongue/throat, SOB, or  low BP? No Did it involve sudden or severe rash/hives, skin peeling, or any reaction on the inside of your mouth or nose? No Did you need to seek medical attention at a hospital or doctor's office? No When did it last happen?       If all above answers are "NO", may proceed with cephalosporin use.      Review of Systems Constitutional: No recent fever/chills/sweats Respiratory: No recent cough/bronchitis Cardiovascular: No chest pain Gastrointestinal: No recent nausea/vomiting/diarrhea Genitourinary:  No UTI symptoms Hematologic/lymphatic:No history of coagulopathy or recent blood thinner use    Objective:   Blood pressure (!) 166/63, pulse 61, temperature 97.9 F (36.6 C), temperature source Temporal, resp. rate 18, height 5\' 4"  (1.626 m), weight 52.2 kg, SpO2 95%. CONSTITUTIONAL: Well-developed, well-nourished female in no acute distress.  HENT:  Normocephalic, atraumatic, External right and left ear normal. Oropharynx is clear and moist EYES: Conjunctivae and EOM are normal. Pupils are equal, round, and reactive to light. No scleral icterus.  NECK: Normal range of motion, supple, no masses SKIN: Skin is warm and dry. No rash noted. Not diaphoretic. No erythema. No pallor. NEUROLOGIC: Alert and oriented to person, place, and time. Normal reflexes, muscle tone coordination. No cranial nerve deficit noted. PSYCHIATRIC: Normal mood and affect. Normal behavior. Normal judgment and thought content. CARDIOVASCULAR: Normal heart rate noted, regular rhythm RESPIRATORY: Effort and breath sounds normal, no problems with respiration noted ABDOMEN: Soft, nontender, nondistended. PELVIC: Deferred MUSCULOSKELETAL: Limited range of motion due to muscular dystrophy. No edema and no tenderness. 2+ distal pulses.    Labs: Results for orders placed or performed during the hospital encounter of 07/12/23 (from the past 2 weeks)  Glucose, capillary   Collection Time: 07/12/23  6:47 AM  Result  Value Ref Range   Glucose-Capillary 164 (H) 70 - 99 mg/dL  Results for orders placed or performed during the hospital encounter of 07/06/23 (from the past 2 weeks)  CBC   Collection Time: 07/06/23  1:16 PM  Result Value Ref Range   WBC 10.1 4.0 - 10.5 K/uL   RBC 4.82 3.87 - 5.11 MIL/uL   Hemoglobin 15.9 (H) 12.0 - 15.0 g/dL   HCT 91.4 (H) 78.2 - 95.6 %   MCV 99.0 80.0 - 100.0 fL   MCH 33.0 26.0 - 34.0 pg   MCHC 33.3 30.0 - 36.0 g/dL   RDW 21.3 08.6 - 57.8 %   Platelets 290 150 - 400 K/uL   nRBC 0.0 0.0 - 0.2 %     Imaging Studies: US PELVIS (TRANSABDOMINAL ONLY) CLINICAL DATA:  PMB   EXAM: TRANSABDOMINAL ULTRASOUND OF PELVIS   TECHNIQUE: Transabdominalultrasound examination of the pelvis was performed including evaluation of the uterus, ovaries, adnexal regions, and pelvic cul-de-sac. At the patient's request, transvaginal imaging was not performed, and this study is very limited.   COMPARISON:  None Available.   FINDINGS: Uterusanteverted, 5 x 3 x 2 cm. The endometrium not visualized. The uterine cavity is empty. There are no uterine masses.   Right ovary   Unremarkable, 1.6 x 1.3 x 0.8 cm.   Left ovary   Unremarkable, 1.4 x 1.2 x 1.1 cm.   Images of the adnexae demonstrated no masses or fluid collections. Color Doppler demonstrated ovarian blood flow.   IMPRESSION: Very limited study. The endometrium was not visualized. No adnexal pathology.   Electronically Signed   By: Layla Maw M.D.   On: 04/22/2023 11:23     Assessment:     Postmenopausal bleeding  Thickened endometrium      Plan:   - Counseling: Procedure, risks, reasons, benefits and complications (including injury to bowel, bladder, major blood vessel, ureter, bleeding, possibility of transfusion, infection, or fistula formation) reviewed in detail. Likelihood of success in alleviating the patient's condition was discussed. Routine postoperative instructions will be reviewed with the  patient and her family in detail after surgery.  The patient concurred with the proposed plan, giving informed written consent for the surgery.   -  Preop testing reviewed. - Has been NPO since midnight      Hildred Laser, MD [facility]

## 2023-07-12 NOTE — Anesthesia Procedure Notes (Addendum)
Procedure Name: LMA Insertion Date/Time: 07/12/2023 7:44 AM  Performed by: Stormy Fabian, CRNAPre-anesthesia Checklist: Patient identified, Patient being monitored, Timeout performed, Emergency Drugs available and Suction available Patient Re-evaluated:Patient Re-evaluated prior to induction Oxygen Delivery Method: Circle system utilized Preoxygenation: Pre-oxygenation with 100% oxygen Induction Type: IV induction Ventilation: Mask ventilation without difficulty LMA: LMA inserted LMA Size: 3.0 Tube type: Oral Number of attempts: 1 Placement Confirmation: positive ETCO2 and breath sounds checked- equal and bilateral Tube secured with: Tape Dental Injury: Teeth and Oropharynx as per pre-operative assessment

## 2023-07-13 ENCOUNTER — Encounter: Payer: Self-pay | Admitting: Obstetrics and Gynecology

## 2023-07-13 LAB — SURGICAL PATHOLOGY

## 2023-07-13 NOTE — Anesthesia Postprocedure Evaluation (Signed)
Anesthesia Post Note  Patient: Andrea Rogers  Procedure(s) Performed: HYSTEROSCOPY DILATATION AND CURETTAGE (Vagina )  Patient location during evaluation: PACU Anesthesia Type: General Level of consciousness: awake and alert Pain management: pain level controlled Vital Signs Assessment: post-procedure vital signs reviewed and stable Respiratory status: spontaneous breathing, nonlabored ventilation, respiratory function stable and patient connected to nasal cannula oxygen Cardiovascular status: blood pressure returned to baseline and stable Postop Assessment: no apparent nausea or vomiting Anesthetic complications: no   No notable events documented.   Last Vitals:  Vitals:   07/12/23 0915 07/12/23 0931  BP: 132/73 130/83  Pulse: 63 66  Resp: 17 18  Temp: (!) 36 C   SpO2: 96% 97%    Last Pain:  Vitals:   07/12/23 0931  TempSrc: Temporal  PainSc: 0-No pain                 Lenard Simmer

## 2023-07-23 DIAGNOSIS — M7021 Olecranon bursitis, right elbow: Secondary | ICD-10-CM | POA: Diagnosis not present

## 2023-07-23 DIAGNOSIS — M7022 Olecranon bursitis, left elbow: Secondary | ICD-10-CM | POA: Diagnosis not present

## 2023-07-27 ENCOUNTER — Ambulatory Visit (INDEPENDENT_AMBULATORY_CARE_PROVIDER_SITE_OTHER): Payer: 59 | Admitting: Obstetrics and Gynecology

## 2023-07-27 ENCOUNTER — Encounter: Payer: Self-pay | Admitting: Obstetrics and Gynecology

## 2023-07-27 VITALS — BP 160/80 | HR 59 | Ht 64.0 in | Wt 117.9 lb

## 2023-07-27 DIAGNOSIS — Z09 Encounter for follow-up examination after completed treatment for conditions other than malignant neoplasm: Secondary | ICD-10-CM

## 2023-07-27 DIAGNOSIS — R9389 Abnormal findings on diagnostic imaging of other specified body structures: Secondary | ICD-10-CM

## 2023-07-27 DIAGNOSIS — N882 Stricture and stenosis of cervix uteri: Secondary | ICD-10-CM

## 2023-07-27 DIAGNOSIS — Z4889 Encounter for other specified surgical aftercare: Secondary | ICD-10-CM

## 2023-07-27 MED ORDER — MEGESTROL ACETATE 40 MG PO TABS: 40.0000 mg | ORAL_TABLET | Freq: Two times a day (BID) | ORAL | 3 refills | Status: AC

## 2023-07-27 NOTE — Progress Notes (Signed)
    OBSTETRICS/GYNECOLOGY POST-OPERATIVE CLINIC VISIT  Subjective:     Andrea Rogers is a 63 y.o. female who presents to the clinic 2 weeks status post for postmenopausal bleeding, thickened endometrium . Eating a regular diet without difficulty. Bowel movements are normal. The patient is not having any pain.  The following portions of the patient's history were reviewed and updated as appropriate: allergies, current medications, past family history, past medical history, past social history, past surgical history, and problem list.  Review of Systems Pertinent items are noted in HPI.   Objective:   BP (!) 160/80   Pulse (!) 59   Ht 5\' 4"  (1.626 m)   Wt 117 lb 14.4 oz (53.5 kg)   LMP  (LMP Unknown)   BMI 20.24 kg/m  Body mass index is 20.24 kg/m.  General:  alert and no distress  Abdomen: soft, bowel sounds active, non-tender  Incision:   healing well, no drainage, no erythema, no hernia, no seroma, no swelling, no dehiscence, incision well approximated    Pathology:   FINAL DIAGNOSIS       1. Endometrium, curettage,  :      FRAGMENTS OF SQUAMOUS EPITHELIUM WITHOUT SIGNIFICANT DIAGNOSTIC ALTERATION.      NO ENDOMETRIUM IDENTIFIED FOR EVALUATION.   Assessment:   Patient s/p HYSTEROSCOPY DILATATION AND CURETTAGE   Cervicaal stenosis Thickened endometrium    Plan:   1. Continue any current medications as instructed by provider. 2. Operative findings again reviewed. Pathology report discussed.  Advised that due to significant cervical stenosis the cavity was apparently not adequately sampled as it was likely a false track that was created. Discussed alternative management at this time. Recommend treatment with Megace 40 mg BID x 3 months, followed by repeat ultrasound. If thickening decreases, no further management needed unless bleeding returns. Has only had 1 episode thus far.  3. Activity restrictions: none. Can resume all activity at this time.  4. Anticipated return to  work: not applicable. 5. Follow up: 3  months  for follow up ultrasound.     Hildred Laser, MD [facility]

## 2023-08-09 DIAGNOSIS — R002 Palpitations: Secondary | ICD-10-CM | POA: Diagnosis not present

## 2023-08-09 DIAGNOSIS — I6523 Occlusion and stenosis of bilateral carotid arteries: Secondary | ICD-10-CM | POA: Diagnosis not present

## 2023-08-09 DIAGNOSIS — Z789 Other specified health status: Secondary | ICD-10-CM | POA: Diagnosis not present

## 2023-08-09 DIAGNOSIS — G459 Transient cerebral ischemic attack, unspecified: Secondary | ICD-10-CM | POA: Diagnosis not present

## 2023-08-09 DIAGNOSIS — I10 Essential (primary) hypertension: Secondary | ICD-10-CM | POA: Diagnosis not present

## 2023-08-09 DIAGNOSIS — R600 Localized edema: Secondary | ICD-10-CM | POA: Diagnosis not present

## 2023-08-09 DIAGNOSIS — E785 Hyperlipidemia, unspecified: Secondary | ICD-10-CM | POA: Diagnosis not present

## 2023-08-09 DIAGNOSIS — Z23 Encounter for immunization: Secondary | ICD-10-CM | POA: Diagnosis not present

## 2023-08-09 DIAGNOSIS — Z720 Tobacco use: Secondary | ICD-10-CM | POA: Diagnosis not present

## 2023-08-09 DIAGNOSIS — E1121 Type 2 diabetes mellitus with diabetic nephropathy: Secondary | ICD-10-CM | POA: Diagnosis not present

## 2023-09-02 DIAGNOSIS — E039 Hypothyroidism, unspecified: Secondary | ICD-10-CM | POA: Diagnosis not present

## 2023-09-02 DIAGNOSIS — E1165 Type 2 diabetes mellitus with hyperglycemia: Secondary | ICD-10-CM | POA: Diagnosis not present

## 2023-09-02 DIAGNOSIS — Z7984 Long term (current) use of oral hypoglycemic drugs: Secondary | ICD-10-CM | POA: Diagnosis not present

## 2023-09-02 DIAGNOSIS — Z01818 Encounter for other preprocedural examination: Secondary | ICD-10-CM | POA: Diagnosis not present

## 2023-09-02 DIAGNOSIS — Z720 Tobacco use: Secondary | ICD-10-CM | POA: Diagnosis not present

## 2023-09-02 DIAGNOSIS — R808 Other proteinuria: Secondary | ICD-10-CM | POA: Diagnosis not present

## 2023-09-02 DIAGNOSIS — I10 Essential (primary) hypertension: Secondary | ICD-10-CM | POA: Diagnosis not present

## 2023-09-06 DIAGNOSIS — M7022 Olecranon bursitis, left elbow: Secondary | ICD-10-CM | POA: Diagnosis not present

## 2023-09-06 DIAGNOSIS — M25422 Effusion, left elbow: Secondary | ICD-10-CM | POA: Diagnosis not present

## 2023-09-09 DIAGNOSIS — R7989 Other specified abnormal findings of blood chemistry: Secondary | ICD-10-CM | POA: Diagnosis not present

## 2023-09-09 DIAGNOSIS — R809 Proteinuria, unspecified: Secondary | ICD-10-CM | POA: Diagnosis not present

## 2023-09-09 DIAGNOSIS — E039 Hypothyroidism, unspecified: Secondary | ICD-10-CM | POA: Diagnosis not present

## 2023-09-09 DIAGNOSIS — F17210 Nicotine dependence, cigarettes, uncomplicated: Secondary | ICD-10-CM | POA: Diagnosis not present

## 2023-09-09 DIAGNOSIS — G2581 Restless legs syndrome: Secondary | ICD-10-CM | POA: Diagnosis not present

## 2023-09-09 DIAGNOSIS — Z23 Encounter for immunization: Secondary | ICD-10-CM | POA: Diagnosis not present

## 2023-09-09 DIAGNOSIS — G71039 Limb girdle muscular dystrophy, unspecified: Secondary | ICD-10-CM | POA: Diagnosis not present

## 2023-09-09 DIAGNOSIS — I10 Essential (primary) hypertension: Secondary | ICD-10-CM | POA: Diagnosis not present

## 2023-09-09 DIAGNOSIS — R634 Abnormal weight loss: Secondary | ICD-10-CM | POA: Diagnosis not present

## 2023-09-09 DIAGNOSIS — E1165 Type 2 diabetes mellitus with hyperglycemia: Secondary | ICD-10-CM | POA: Diagnosis not present

## 2023-09-09 DIAGNOSIS — M170 Bilateral primary osteoarthritis of knee: Secondary | ICD-10-CM | POA: Diagnosis not present

## 2023-09-09 DIAGNOSIS — R768 Other specified abnormal immunological findings in serum: Secondary | ICD-10-CM | POA: Diagnosis not present

## 2023-09-21 DIAGNOSIS — Z720 Tobacco use: Secondary | ICD-10-CM | POA: Diagnosis not present

## 2023-09-21 DIAGNOSIS — R1013 Epigastric pain: Secondary | ICD-10-CM | POA: Diagnosis not present

## 2023-09-21 DIAGNOSIS — Z860101 Personal history of adenomatous and serrated colon polyps: Secondary | ICD-10-CM | POA: Diagnosis not present

## 2023-09-21 DIAGNOSIS — R634 Abnormal weight loss: Secondary | ICD-10-CM | POA: Diagnosis not present

## 2023-09-23 ENCOUNTER — Other Ambulatory Visit: Payer: Self-pay | Admitting: Gastroenterology

## 2023-09-23 DIAGNOSIS — Z860101 Personal history of adenomatous and serrated colon polyps: Secondary | ICD-10-CM

## 2023-09-23 DIAGNOSIS — Z720 Tobacco use: Secondary | ICD-10-CM

## 2023-09-23 DIAGNOSIS — R1013 Epigastric pain: Secondary | ICD-10-CM

## 2023-09-23 DIAGNOSIS — R634 Abnormal weight loss: Secondary | ICD-10-CM

## 2023-09-30 DIAGNOSIS — R768 Other specified abnormal immunological findings in serum: Secondary | ICD-10-CM | POA: Diagnosis not present

## 2023-09-30 DIAGNOSIS — G8929 Other chronic pain: Secondary | ICD-10-CM | POA: Diagnosis not present

## 2023-09-30 DIAGNOSIS — E790 Hyperuricemia without signs of inflammatory arthritis and tophaceous disease: Secondary | ICD-10-CM | POA: Diagnosis not present

## 2023-09-30 DIAGNOSIS — M25561 Pain in right knee: Secondary | ICD-10-CM | POA: Diagnosis not present

## 2023-09-30 DIAGNOSIS — M2550 Pain in unspecified joint: Secondary | ICD-10-CM | POA: Diagnosis not present

## 2023-09-30 DIAGNOSIS — M7051 Other bursitis of knee, right knee: Secondary | ICD-10-CM | POA: Diagnosis not present

## 2023-09-30 DIAGNOSIS — M1711 Unilateral primary osteoarthritis, right knee: Secondary | ICD-10-CM | POA: Diagnosis not present

## 2023-10-04 DIAGNOSIS — G71039 Limb girdle muscular dystrophy, unspecified: Secondary | ICD-10-CM | POA: Diagnosis not present

## 2023-10-04 DIAGNOSIS — G5793 Unspecified mononeuropathy of bilateral lower limbs: Secondary | ICD-10-CM | POA: Diagnosis not present

## 2023-10-05 ENCOUNTER — Ambulatory Visit
Admission: RE | Admit: 2023-10-05 | Discharge: 2023-10-05 | Disposition: A | Discharge status: Home / Self Care | Source: Ambulatory Visit | Attending: Gastroenterology | Admitting: Gastroenterology

## 2023-10-05 DIAGNOSIS — R918 Other nonspecific abnormal finding of lung field: Secondary | ICD-10-CM | POA: Diagnosis not present

## 2023-10-05 DIAGNOSIS — R1013 Epigastric pain: Secondary | ICD-10-CM

## 2023-10-05 DIAGNOSIS — I2510 Atherosclerotic heart disease of native coronary artery without angina pectoris: Secondary | ICD-10-CM | POA: Insufficient documentation

## 2023-10-05 DIAGNOSIS — Z860101 Personal history of adenomatous and serrated colon polyps: Secondary | ICD-10-CM | POA: Diagnosis not present

## 2023-10-05 DIAGNOSIS — J432 Centrilobular emphysema: Secondary | ICD-10-CM | POA: Diagnosis not present

## 2023-10-05 DIAGNOSIS — R634 Abnormal weight loss: Secondary | ICD-10-CM | POA: Diagnosis not present

## 2023-10-05 DIAGNOSIS — J439 Emphysema, unspecified: Secondary | ICD-10-CM | POA: Diagnosis not present

## 2023-10-05 DIAGNOSIS — Z720 Tobacco use: Secondary | ICD-10-CM | POA: Diagnosis not present

## 2023-10-05 DIAGNOSIS — K5730 Diverticulosis of large intestine without perforation or abscess without bleeding: Secondary | ICD-10-CM | POA: Diagnosis not present

## 2023-10-05 MED ORDER — IOHEXOL 300 MG/ML  SOLN
100.0000 mL | Freq: Once | INTRAMUSCULAR | Status: AC | PRN
  Administered 2023-10-05: 100 mL via INTRAVENOUS

## 2023-10-06 ENCOUNTER — Encounter: Payer: Self-pay | Admitting: Gastroenterology

## 2023-10-06 ENCOUNTER — Encounter (INDEPENDENT_AMBULATORY_CARE_PROVIDER_SITE_OTHER): Payer: Self-pay | Admitting: Vascular Surgery

## 2023-10-07 DIAGNOSIS — E785 Hyperlipidemia, unspecified: Secondary | ICD-10-CM | POA: Diagnosis not present

## 2023-10-07 DIAGNOSIS — E1129 Type 2 diabetes mellitus with other diabetic kidney complication: Secondary | ICD-10-CM | POA: Diagnosis not present

## 2023-10-07 DIAGNOSIS — G7100 Muscular dystrophy, unspecified: Secondary | ICD-10-CM | POA: Diagnosis not present

## 2023-10-07 DIAGNOSIS — Z122 Encounter for screening for malignant neoplasm of respiratory organs: Secondary | ICD-10-CM | POA: Diagnosis not present

## 2023-10-07 DIAGNOSIS — R8290 Unspecified abnormal findings in urine: Secondary | ICD-10-CM | POA: Diagnosis not present

## 2023-10-07 DIAGNOSIS — J449 Chronic obstructive pulmonary disease, unspecified: Secondary | ICD-10-CM | POA: Diagnosis not present

## 2023-10-07 DIAGNOSIS — R911 Solitary pulmonary nodule: Secondary | ICD-10-CM | POA: Diagnosis not present

## 2023-10-07 DIAGNOSIS — J432 Centrilobular emphysema: Secondary | ICD-10-CM | POA: Diagnosis not present

## 2023-10-07 DIAGNOSIS — I10 Essential (primary) hypertension: Secondary | ICD-10-CM | POA: Diagnosis not present

## 2023-10-07 DIAGNOSIS — R809 Proteinuria, unspecified: Secondary | ICD-10-CM | POA: Diagnosis not present

## 2023-10-12 ENCOUNTER — Encounter (INDEPENDENT_AMBULATORY_CARE_PROVIDER_SITE_OTHER): Payer: Self-pay

## 2023-10-14 ENCOUNTER — Ambulatory Visit: Admitting: General Practice

## 2023-10-14 ENCOUNTER — Encounter
Admission: RE | Disposition: A | Discharge status: Home / Self Care | Payer: Self-pay | Source: Home / Self Care | Attending: Gastroenterology

## 2023-10-14 ENCOUNTER — Encounter: Payer: Self-pay | Admitting: Gastroenterology

## 2023-10-14 ENCOUNTER — Ambulatory Visit
Admission: RE | Admit: 2023-10-14 | Discharge: 2023-10-14 | Disposition: A | Discharge status: Home / Self Care | Attending: Gastroenterology | Admitting: Gastroenterology

## 2023-10-14 DIAGNOSIS — R519 Headache, unspecified: Secondary | ICD-10-CM | POA: Diagnosis not present

## 2023-10-14 DIAGNOSIS — I10 Essential (primary) hypertension: Secondary | ICD-10-CM | POA: Insufficient documentation

## 2023-10-14 DIAGNOSIS — Z79899 Other long term (current) drug therapy: Secondary | ICD-10-CM | POA: Insufficient documentation

## 2023-10-14 DIAGNOSIS — K5790 Diverticulosis of intestine, part unspecified, without perforation or abscess without bleeding: Secondary | ICD-10-CM | POA: Diagnosis not present

## 2023-10-14 DIAGNOSIS — R634 Abnormal weight loss: Secondary | ICD-10-CM | POA: Diagnosis not present

## 2023-10-14 DIAGNOSIS — E039 Hypothyroidism, unspecified: Secondary | ICD-10-CM | POA: Diagnosis not present

## 2023-10-14 DIAGNOSIS — F17200 Nicotine dependence, unspecified, uncomplicated: Secondary | ICD-10-CM | POA: Diagnosis not present

## 2023-10-14 DIAGNOSIS — K621 Rectal polyp: Secondary | ICD-10-CM | POA: Insufficient documentation

## 2023-10-14 DIAGNOSIS — K219 Gastro-esophageal reflux disease without esophagitis: Secondary | ICD-10-CM | POA: Diagnosis not present

## 2023-10-14 DIAGNOSIS — K635 Polyp of colon: Secondary | ICD-10-CM | POA: Insufficient documentation

## 2023-10-14 DIAGNOSIS — D122 Benign neoplasm of ascending colon: Secondary | ICD-10-CM | POA: Insufficient documentation

## 2023-10-14 DIAGNOSIS — Z1211 Encounter for screening for malignant neoplasm of colon: Secondary | ICD-10-CM | POA: Insufficient documentation

## 2023-10-14 DIAGNOSIS — Z79890 Hormone replacement therapy (postmenopausal): Secondary | ICD-10-CM | POA: Insufficient documentation

## 2023-10-14 DIAGNOSIS — E119 Type 2 diabetes mellitus without complications: Secondary | ICD-10-CM | POA: Insufficient documentation

## 2023-10-14 DIAGNOSIS — R1013 Epigastric pain: Secondary | ICD-10-CM | POA: Diagnosis not present

## 2023-10-14 DIAGNOSIS — K5730 Diverticulosis of large intestine without perforation or abscess without bleeding: Secondary | ICD-10-CM | POA: Insufficient documentation

## 2023-10-14 DIAGNOSIS — F419 Anxiety disorder, unspecified: Secondary | ICD-10-CM | POA: Insufficient documentation

## 2023-10-14 DIAGNOSIS — Z860101 Personal history of adenomatous and serrated colon polyps: Secondary | ICD-10-CM | POA: Diagnosis not present

## 2023-10-14 DIAGNOSIS — G71039 Limb girdle muscular dystrophy, unspecified: Secondary | ICD-10-CM | POA: Insufficient documentation

## 2023-10-14 DIAGNOSIS — Z8711 Personal history of peptic ulcer disease: Secondary | ICD-10-CM | POA: Diagnosis not present

## 2023-10-14 DIAGNOSIS — Z7984 Long term (current) use of oral hypoglycemic drugs: Secondary | ICD-10-CM | POA: Insufficient documentation

## 2023-10-14 HISTORY — PX: ESOPHAGOGASTRODUODENOSCOPY: SHX5428

## 2023-10-14 HISTORY — PX: COLONOSCOPY: SHX5424

## 2023-10-14 LAB — GLUCOSE, CAPILLARY: Glucose-Capillary: 126 mg/dL — ABNORMAL HIGH (ref 70–99)

## 2023-10-14 SURGERY — COLONOSCOPY
Anesthesia: General

## 2023-10-14 MED ORDER — PROPOFOL 500 MG/50ML IV EMUL
INTRAVENOUS | Status: DC | PRN
  Administered 2023-10-14: 150 ug/kg/min via INTRAVENOUS

## 2023-10-14 MED ORDER — LIDOCAINE HCL (CARDIAC) PF 100 MG/5ML IV SOSY
PREFILLED_SYRINGE | INTRAVENOUS | Status: DC | PRN
  Administered 2023-10-14: 100 mg via INTRAVENOUS

## 2023-10-14 MED ORDER — SODIUM CHLORIDE 0.9 % IV SOLN: INTRAVENOUS | Status: DC

## 2023-10-14 MED ORDER — PROPOFOL 1000 MG/100ML IV EMUL
INTRAVENOUS | Status: AC
  Filled 2023-10-14: qty 100

## 2023-10-14 NOTE — Anesthesia Preprocedure Evaluation (Addendum)
 Anesthesia Evaluation  Patient identified by MRN, date of birth, ID band Patient awake  General Assessment Comment:Never had general anesthetic, only colonoscopies  Reviewed: Allergy & Precautions, NPO status , Patient's Chart, lab work & pertinent test results  History of Anesthesia Complications Negative for: history of anesthetic complications  Airway Mallampati: II  TM Distance: >3 FB Neck ROM: Full    Dental no notable dental hx. (+) Teeth Intact, Dental Advisory Given   Pulmonary neg shortness of breath, neg sleep apnea, neg COPD, neg recent URI, Current Smoker and Patient abstained from smoking.   Pulmonary exam normal breath sounds clear to auscultation       Cardiovascular Exercise Tolerance: Good hypertension, Pt. on medications (-) angina (-) CAD, (-) Past MI and (-) Cardiac Stents (-) dysrhythmias  Rhythm:Regular Rate:Normal - Systolic murmurs    Neuro/Psych  Headaches, neg Seizures PSYCHIATRIC DISORDERS Anxiety     Limb girdle muscular dystrophy (biopsy confirmed); does not affect respiratory muscles to her knowledge TIA Neuromuscular disease  negative psych ROS   GI/Hepatic ,GERD  Controlled,,(+)     (-) substance abuse    Endo/Other  diabetes, Oral Hypoglycemic AgentsHypothyroidism    Renal/GU      Musculoskeletal   Abdominal   Peds  Hematology   Anesthesia Other Findings Past Medical History: No date: Anal condyloma No date: Arthritis No date: Bronchitis No date: Diabetes mellitus without complication (HCC) No date: GERD (gastroesophageal reflux disease) No date: Headache No date: HLD (hyperlipidemia) No date: Hypertension No date: Hypothyroidism No date: MD (muscular dystrophy) (HCC) No date: Menopause No date: Muscular dystrophy, limb girdle (HCC) No date: Osteopenia No date: Recurrent UTI 02/2019: Stroke [facility])     Comment:  TIA No date: Thyroid  disease No date: Tobacco abuse   Reproductive/Obstetrics                             Anesthesia Physical Anesthesia Plan  ASA: 3  Anesthesia Plan: General   Post-op Pain Management: Minimal or no pain anticipated   Induction: Intravenous  PONV Risk Score and Plan: 3 and Propofol  infusion, TIVA and Ondansetron   Airway Management Planned: Nasal Cannula  Additional Equipment: None  Intra-op Plan:   Post-operative Plan:   Informed Consent: I have reviewed the patients History and Physical, chart, labs and discussed the procedure including the risks, benefits and alternatives for the proposed anesthesia with the patient or authorized representative who has indicated his/her understanding and acceptance.     Dental advisory given  Plan Discussed with: CRNA and Surgeon  Anesthesia Plan Comments: (Discussed risks of anesthesia with patient, including possibility of difficulty with spontaneous ventilation under anesthesia necessitating airway intervention, PONV, and rare risks such as cardiac or respiratory or neurological events, and allergic reactions. Discussed the role of CRNA in patient's perioperative care. Patient understands.)       Anesthesia Quick Evaluation

## 2023-10-14 NOTE — Op Note (Signed)
 [facility] Gastroenterology Patient Name: Andrea Rogers Procedure Date: 10/14/2023 7:40 AM MRN: [id number] Account #: [id number] Date of Birth: 03/17/1961 Admit Type: Outpatient Age: 63 Room: [facility] ENDO ROOM 1 Gender: Female Note Status: Finalized Instrument Name: Upper Endoscope [id number] Procedure:             Upper GI endoscopy Indications:           Dyspepsia, Weight loss Providers:             Bridgett Camps, DO Referring MD:          [name] DO, DO (Referring MD), Antonio Baumgarten, MD (Referring MD) Medicines:             Monitored Anesthesia Care Complications:         No immediate complications. Estimated blood loss: None. Procedure:             Pre-Anesthesia Assessment:                        - Prior to the procedure, a History and Physical was                         performed, and patient medications and allergies were                         reviewed. The patient is competent. The risks and                         benefits of the procedure and the sedation options and                         risks were discussed with the patient. All questions                         were answered and informed consent was obtained.                         Patient identification and proposed procedure were                         verified by the physician, the nurse, the anesthetist                         and the technician in the endoscopy suite. Mental                         Status Examination: alert and oriented. Airway                         Examination: normal oropharyngeal airway and neck                         mobility. Respiratory Examination: clear to                         auscultation. CV Examination: RRR, no murmurs, no S3  or S4. Prophylactic Antibiotics: The patient does not                         require prophylactic antibiotics. Prior                         Anticoagulants: The patient  has taken no anticoagulant                         or antiplatelet agents. ASA Grade Assessment: II - A                         patient with mild systemic disease. After reviewing                         the risks and benefits, the patient was deemed in                         satisfactory condition to undergo the procedure. The                         anesthesia plan was to use monitored anesthesia care                         (MAC). Immediately prior to administration of                         medications, the patient was re-assessed for adequacy                         to receive sedatives. The heart rate, respiratory                         rate, oxygen saturations, blood pressure, adequacy of                         pulmonary ventilation, and response to care were                         monitored throughout the procedure. The physical                         status of the patient was re-assessed after the                         procedure.                        After obtaining informed consent, the endoscope was                         passed under direct vision. Throughout the procedure,                         the patient's blood pressure, pulse, and oxygen                         saturations were monitored continuously. The Endoscope  was introduced through the mouth, and advanced to the                         second part of duodenum. The upper GI endoscopy was                         accomplished without difficulty. The patient tolerated                         the procedure well. Findings:      The duodenal bulb, first portion of the duodenum and second portion of       the duodenum were normal.      The entire examined stomach was normal. Estimated blood loss: none.      The examined esophagus was normal. Estimated blood loss: none.      Esophagogastric landmarks were identified: the gastroesophageal junction       was found at 35 cm from the  incisors.      The Z-line was regular. Estimated blood loss: none. Impression:            - Normal duodenal bulb, first portion of the duodenum                         and second portion of the duodenum.                        - Normal stomach.                        - Normal esophagus.                        - Esophagogastric landmarks identified.                        - Z-line regular.                        - No specimens collected. Recommendation:        - Patient has a contact number available for                         emergencies. The signs and symptoms of potential                         delayed complications were discussed with the patient.                         Return to normal activities tomorrow. Written                         discharge instructions were provided to the patient.                        - Discharge patient to home.                        - Resume previous diet.                        - Continue present medications.                        -  Return to GI clinic as previously scheduled.                        - Proceed with colonoscopy. see report for further                         recommendations.                        - The findings and recommendations were discussed with                         the patient. Procedure Code(s):     --- Professional ---                        [phone], Esophagogastroduodenoscopy, flexible,                         transoral; diagnostic, including collection of                         specimen(s) by brushing or washing, when performed                         (separate procedure) Diagnosis Code(s):     --- Professional ---                        R10.13, Epigastric pain                        R63.4, Abnormal weight loss CPT copyright 2022 American Medical Association. All rights reserved. The codes documented in this report are preliminary and upon coder review may  be revised to meet current compliance  requirements. Attending Participation:      I personally performed the entire procedure. Polo Brisk, DO [name] DO, DO 10/14/2023 7:52:58 AM This report has been signed electronically. Number of Addenda: 0 Note Initiated On: 10/14/2023 7:40 AM Estimated Blood Loss:  Estimated blood loss: none.      [facility]

## 2023-10-14 NOTE — H&P (Signed)
 Pre-Procedure H&P   Patient ID: Andrea Rogers is a 63 y.o. female.  Gastroenterology Provider: [name], DO  Referring Provider: Corrinne Din, NP PCP: Antonio Baumgarten, MD  Date: 10/14/2023  HPI Andrea Rogers is a 63 y.o. female who presents today for Esophagogastroduodenoscopy and Colonoscopy for dyspepsia, weight loss; phx colon polyps .  No melena or hematochezia.  Down 30 pounds since 2020  Patient last underwent colonoscopy in December 2019 with hyperplastic polyps and anal condylomata.  2015 colonoscopy diverticulosis  EGD in 2016 negative for H. pylori and Barrett's esophagus.  History of gastric ulcer and esophagitis  Hemoglobin 14.7 MCV 96 platelets 318,000 BUN 30 creatinine 0.4  Father with CRC   Past Medical History:  Diagnosis Date   Anal condyloma    Arthritis    Bronchitis    GERD (gastroesophageal reflux disease)    Headache    HLD (hyperlipidemia)    Hypertension    Hypothyroidism    MD (muscular dystrophy) (HCC)    Menopause    Muscular dystrophy, limb girdle (HCC)    Osteopenia    Post-menopausal bleeding    Recurrent UTI    Restless leg syndrome    T2DM (type 2 diabetes mellitus) (HCC)    TIA (transient ischemic attack) 02/2019   Tobacco abuse     Past Surgical History:  Procedure Laterality Date   COLONOSCOPY WITH PROPOFOL  N/A 04/25/2018   Procedure: COLONOSCOPY WITH PROPOFOL ;  Surgeon: Deveron Fly, MD;  Location: [facility] ENDOSCOPY;  Service: Endoscopy;  Laterality: N/A;   DILATION AND CURETTAGE OF UTERUS     ENDARTERECTOMY Right 08/11/2019   Procedure: ENDARTERECTOMY CAROTID;  Surgeon: Jackquelyn Mass, MD;  Location: [facility] ORS;  Service: Vascular;  Laterality: Right;   HYSTEROSCOPY WITH D & C N/A 07/12/2023   Procedure: HYSTEROSCOPY DILATATION AND CURETTAGE;  Surgeon: Teresa Fender, MD;  Location: [facility] ORS;  Service: Gynecology;  Laterality: N/A;    Family History Father- crc  No h/o GI disease or  malignancy  Review of Systems  Constitutional:  Positive for unexpected weight change. Negative for activity change, appetite change, chills, diaphoresis, fatigue and fever.  HENT:  Negative for trouble swallowing and voice change.   Respiratory:  Negative for shortness of breath and wheezing.   Cardiovascular:  Negative for chest pain, palpitations and leg swelling.  Gastrointestinal:  Negative for abdominal distention, abdominal pain, anal bleeding, blood in stool, constipation, diarrhea, nausea, rectal pain and vomiting.  Musculoskeletal:  Negative for arthralgias and myalgias.  Skin:  Negative for color change and pallor.  Neurological:  Negative for dizziness, syncope and weakness.  Psychiatric/Behavioral:  Negative for confusion.   All other systems reviewed and are negative.    Medications No current facility-administered medications on file prior to encounter.   Current Outpatient Medications on File Prior to Encounter  Medication Sig Dispense Refill   ALPRAZolam  (XANAX ) 0.25 MG tablet Take 0.25 mg by mouth at bedtime.     atenolol  (TENORMIN ) 100 MG tablet Take 100 mg by mouth daily.     cholecalciferol (VITAMIN D3) 25 MCG (1000 UNIT) tablet Take 1,000 Units by mouth daily.     ezetimibe  (ZETIA ) 10 MG tablet Take 10 mg by mouth at bedtime.      ferrous sulfate 325 (65 FE) MG EC tablet Take 325 mg by mouth daily with breakfast.     glimepiride (AMARYL) 1 MG tablet Take 1 mg by mouth daily.      HYDROcodone -acetaminophen  (NORCO)  7.5-325 MG tablet Take 1 tablet by mouth daily after supper.     levothyroxine  (SYNTHROID ) 100 MCG tablet Take 100 mcg by mouth daily before breakfast.     losartan (COZAAR) 50 MG tablet Take 50 mg by mouth daily.     megestrol  (MEGACE ) 40 MG tablet Take 1 tablet (40 mg total) by mouth 2 (two) times daily. 60 tablet 3   metFORMIN (GLUCOPHAGE) 500 MG tablet Take 1,000 mg by mouth 2 (two) times daily with a meal.      omeprazole (PRILOSEC) 20 MG capsule  Take 20 mg by mouth daily.     triamterene -hydrochlorothiazide  (MAXZIDE -25) 37.5-25 MG tablet Take 1 tablet by mouth daily.     vitamin B-12 (CYANOCOBALAMIN ) 500 MCG tablet Take 500 mcg by mouth daily.     acetaminophen  (TYLENOL ) 500 MG tablet Take 2 tablets (1,000 mg total) by mouth every 6 (six) hours as needed. 30 tablet 0   ALPRAZolam  (XANAX ) 0.5 MG tablet Take 0.5 mg by mouth at bedtime as needed for anxiety or sleep.     conjugated estrogens  (PREMARIN ) vaginal cream Place 1 Applicatorful vaginally at bedtime. Apply nightly to vulva for 2 weeks.     docusate sodium  (COLACE) 100 MG capsule Take 100 mg by mouth at bedtime.     erythromycin ophthalmic ointment Place 1 Application into both eyes daily as needed.     MAGNESIUM PO Take 250 mg by mouth daily.     valACYclovir  (VALTREX ) 1000 MG tablet Take 1 tablet (1,000 mg total) by mouth daily. 10 tablet 6    Pertinent medications related to GI and procedure were reviewed by me with the patient prior to the procedure   Current Facility-Administered Medications:    0.9 %  sodium chloride  infusion, , Intravenous, Continuous, [name], DO  sodium chloride          Allergies  Allergen Reactions   Latex Rash   Penicillins Other (See Comments)    Yeast infection Did it involve swelling of the face/tongue/throat, SOB, or low BP? No Did it involve sudden or severe rash/hives, skin peeling, or any reaction on the inside of your mouth or nose? No Did you need to seek medical attention at a hospital or doctor's office? No When did it last happen?       If all above answers are "NO", may proceed with cephalosporin use.    Allergies were reviewed by me prior to the procedure  Objective   Body mass index is 19.53 kg/m. Vitals:   10/14/23 0715  BP: (!) 142/57  Pulse: 65  Resp: 16  Temp: (!) 96.5 F (35.8 C)  TempSrc: Temporal  SpO2: 99%  Weight: 51.6 kg     Physical Exam Vitals and nursing note reviewed.   Constitutional:      General: She is not in acute distress.    Appearance: Normal appearance. She is not ill-appearing, toxic-appearing or diaphoretic.  HENT:     Head: Normocephalic and atraumatic.     Nose: Nose normal.     Mouth/Throat:     Mouth: Mucous membranes are moist.     Pharynx: Oropharynx is clear.  Eyes:     General: No scleral icterus.    Extraocular Movements: Extraocular movements intact.  Cardiovascular:     Rate and Rhythm: Normal rate and regular rhythm.     Heart sounds: Murmur heard.     No friction rub. No gallop.  Pulmonary:     Effort: Pulmonary effort is  normal. No respiratory distress.     Breath sounds: Normal breath sounds. No wheezing, rhonchi or rales.  Abdominal:     General: Bowel sounds are normal. There is no distension.     Palpations: Abdomen is soft.     Tenderness: There is no abdominal tenderness. There is no guarding or rebound.  Musculoskeletal:     Cervical back: Neck supple.     Right lower leg: No edema.     Left lower leg: No edema.  Skin:    General: Skin is warm and dry.     Coloration: Skin is not jaundiced or pale.  Neurological:     General: No focal deficit present.     Mental Status: She is alert and oriented to person, place, and time. Mental status is at baseline.  Psychiatric:        Mood and Affect: Mood normal.        Behavior: Behavior normal.        Thought Content: Thought content normal.        Judgment: Judgment normal.      Assessment:  Ms. AUDREENA SACHDEVA is a 63 y.o. female  who presents today for Esophagogastroduodenoscopy and Colonoscopy for dyspepsia, weight loss; phx colon polyps .  Plan:  Esophagogastroduodenoscopy and Colonoscopy with possible intervention today  Esophagogastroduodenoscopy and Colonoscopy with possible biopsy, control of bleeding, polypectomy, and interventions as necessary has been discussed with the patient/patient representative. Informed consent was obtained from the  patient/patient representative after explaining the indication, nature, and risks of the procedure including but not limited to death, bleeding, perforation, missed neoplasm/lesions, cardiorespiratory compromise, and reaction to medications. Opportunity for questions was given and appropriate answers were provided. Patient/patient representative has verbalized understanding is amenable to undergoing the procedure.   [name], DO  [facility]  Portions of the record may have been created with voice recognition software. Occasional wrong-word or 'sound-a-like' substitutions may have occurred due to the inherent limitations of voice recognition software.  Read the chart carefully and recognize, using context, where substitutions may have occurred.

## 2023-10-14 NOTE — Anesthesia Postprocedure Evaluation (Signed)
 Anesthesia Post Note  Patient: Andrea Rogers  Procedure(s) Performed: COLONOSCOPY EGD (ESOPHAGOGASTRODUODENOSCOPY)  Patient location during evaluation: Endoscopy Anesthesia Type: General Level of consciousness: awake and alert Pain management: pain level controlled Vital Signs Assessment: post-procedure vital signs reviewed and stable Respiratory status: spontaneous breathing, nonlabored ventilation, respiratory function stable and patient connected to nasal cannula oxygen Cardiovascular status: blood pressure returned to baseline and stable Postop Assessment: no apparent nausea or vomiting Anesthetic complications: no  There were no known notable events for this encounter.   Last Vitals:  Vitals:   10/14/23 0715 10/14/23 0819  BP: (!) 142/57 (!) 127/43  Pulse: 65 64  Resp: 16 17  Temp: (!) 35.8 C (!) 35.8 C  SpO2: 99% 100%    Last Pain:  Vitals:   10/14/23 0819  TempSrc: Temporal                 Enrique Harvest

## 2023-10-14 NOTE — Transfer of Care (Signed)
 Immediate Anesthesia Transfer of Care Note  Patient: Andrea Rogers  Procedure(s) Performed: COLONOSCOPY EGD (ESOPHAGOGASTRODUODENOSCOPY)  Patient Location: PACU  Anesthesia Type:General  Level of Consciousness: drowsy and patient cooperative  Airway & Oxygen Therapy: Patient Spontanous Breathing and Patient connected to nasal cannula oxygen  Post-op Assessment: Report given to RN and Post -op Vital signs reviewed and stable  Post vital signs: stable  Last Vitals:  Vitals Value Taken Time  BP 127/43 10/14/23 0820  Temp 35.8 C 10/14/23 0819  Pulse 64 10/14/23 0819  Resp 20 10/14/23 0822  SpO2 100 % 10/14/23 0819  Vitals shown include unfiled device data.  Last Pain:  Vitals:   10/14/23 0819  TempSrc: Temporal         Complications: No notable events documented.

## 2023-10-14 NOTE — Interval H&P Note (Signed)
 History and Physical Interval Note: Preprocedure H&P from 10/14/23  was reviewed and there was no interval change after seeing and examining the patient.  Written consent was obtained from the patient after discussion of risks, benefits, and alternatives. Patient has consented to proceed with Esophagogastroduodenoscopy and Colonoscopy with possible intervention   10/14/2023 7:34 AM  Andrea Rogers  has presented today for surgery, with the diagnosis of History of adenomatous polyp of colon (Z86.0101) Abnormal weight loss (R63.4) Dyspepsia (R10.13).  The various methods of treatment have been discussed with the patient and family. After consideration of risks, benefits and other options for treatment, the patient has consented to  Procedure(s) with comments: COLONOSCOPY (N/A) EGD (ESOPHAGOGASTRODUODENOSCOPY) (N/A) - DM as a surgical intervention.  The patient's history has been reviewed, patient examined, no change in status, stable for surgery.  I have reviewed the patient's chart and labs.  Questions were answered to the patient's satisfaction.     [name]

## 2023-10-14 NOTE — Op Note (Signed)
 [facility] Gastroenterology Patient Name: Andrea Rogers Procedure Date: 10/14/2023 7:40 AM MRN: [id number] Account #: [id number] Date of Birth: Aug 09, 1960 Admit Type: Outpatient Age: 63 Room: [facility] ENDO ROOM 1 Gender: Female Note Status: Finalized Instrument Name: Peds Colonoscope [id number] Procedure:             Colonoscopy Indications:           High risk colon cancer surveillance: Personal history                         of colonic polyps Providers:             Bridgett Camps, DO Referring MD:          [name] DO, DO (Referring MD), Antonio Baumgarten, MD (Referring MD) Medicines:             Monitored Anesthesia Care Complications:         No immediate complications. Estimated blood loss:                         Minimal. Procedure:             Pre-Anesthesia Assessment:                        - Prior to the procedure, a History and Physical was                         performed, and patient medications and allergies were                         reviewed. The patient is competent. The risks and                         benefits of the procedure and the sedation options and                         risks were discussed with the patient. All questions                         were answered and informed consent was obtained.                         Patient identification and proposed procedure were                         verified by the physician, the nurse, the anesthetist                         and the technician in the endoscopy suite. Mental                         Status Examination: alert and oriented. Airway                         Examination: normal oropharyngeal airway and neck  mobility. Respiratory Examination: clear to                         auscultation. CV Examination: RRR, no murmurs, no S3                         or S4. Prophylactic Antibiotics: The patient does not                          require prophylactic antibiotics. Prior                         Anticoagulants: The patient has taken no anticoagulant                         or antiplatelet agents. ASA Grade Assessment: III - A                         patient with severe systemic disease. After reviewing                         the risks and benefits, the patient was deemed in                         satisfactory condition to undergo the procedure. The                         anesthesia plan was to use monitored anesthesia care                         (MAC). Immediately prior to administration of                         medications, the patient was re-assessed for adequacy                         to receive sedatives. The heart rate, respiratory                         rate, oxygen saturations, blood pressure, adequacy of                         pulmonary ventilation, and response to care were                         monitored throughout the procedure. The physical                         status of the patient was re-assessed after the                         procedure.                        After obtaining informed consent, the colonoscope was                         passed under direct vision. Throughout the procedure,  the patient's blood pressure, pulse, and oxygen                         saturations were monitored continuously. The                         Colonoscope was introduced through the anus and                         advanced to the the cecum, identified by appendiceal                         orifice and ileocecal valve. The colonoscopy was                         performed without difficulty. The patient tolerated                         the procedure well. The quality of the bowel                         preparation was evaluated using the BBPS [facility] Bowel                         Preparation Scale) with scores of: Right Colon = 3,                         Transverse Colon = 3  and Left Colon = 3 (entire mucosa                         seen well with no residual staining, small fragments                         of stool or opaque liquid). The total BBPS score                         equals 9. The ileocecal valve, appendiceal orifice,                         and rectum were photographed. Findings:      The digital rectal exam findings include decreased sphincter tone.      The perianal examination was normal.      Multiple small-mouthed diverticula were found in the sigmoid colon.       Estimated blood loss: none.      Seven sessile polyps were found in the rectum, sigmoid colon, descending       colon and ascending colon. The polyps were 1 to 2 mm in size. These       polyps were removed with a jumbo cold forceps. Resection and retrieval       were complete. Estimated blood loss was minimal.      The exam was otherwise without abnormality on direct and retroflexion       views. Impression:            - Decreased sphincter tone found on digital rectal                         exam.                        -  Diverticulosis in the sigmoid colon.                        - Seven 1 to 2 mm polyps in the rectum, in the sigmoid                         colon, in the descending colon and in the ascending                         colon, removed with a jumbo cold forceps. Resected and                         retrieved.                        - The examination was otherwise normal on direct and                         retroflexion views. Recommendation:        - Patient has a contact number available for                         emergencies. The signs and symptoms of potential                         delayed complications were discussed with the patient.                         Return to normal activities tomorrow. Written                         discharge instructions were provided to the patient.                        - Discharge patient to home.                        -  Resume previous diet.                        - Continue present medications.                        - No ibuprofen, naproxen, or other non-steroidal                         anti-inflammatory drugs for 5 days after polyp removal.                        - Repeat colonoscopy for surveillance based on                         pathology results.                        - Return to referring physician as previously                         scheduled. Procedure Code(s):     --- Professional ---  16109, Colonoscopy, flexible; with biopsy, single or                         multiple Diagnosis Code(s):     --- Professional ---                        Z86.010, Personal history of colonic polyps                        K62.89, Other specified diseases of anus and rectum                        D12.8, Benign neoplasm of rectum                        D12.5, Benign neoplasm of sigmoid colon                        D12.4, Benign neoplasm of descending colon                        D12.2, Benign neoplasm of ascending colon                        K57.30, Diverticulosis of large intestine without                         perforation or abscess without bleeding CPT copyright 2022 American [employer] Association. All rights reserved. The codes documented in this report are preliminary and upon coder review may  be revised to meet current compliance requirements. Attending Participation:      I personally performed the entire procedure. Polo Brisk, DO [name] DO, DO 10/14/2023 8:28:24 AM This report has been signed electronically. Number of Addenda: 0 Note Initiated On: 10/14/2023 7:40 AM Scope Withdrawal Time: 0 hours 14 minutes 35 seconds  Total Procedure Duration: 0 hours 20 minutes 28 seconds  Estimated Blood Loss:  Estimated blood loss was minimal.      [facility]

## 2023-10-19 LAB — SURGICAL PATHOLOGY

## 2023-10-27 ENCOUNTER — Other Ambulatory Visit

## 2023-10-27 ENCOUNTER — Ambulatory Visit
Admission: RE | Admit: 2023-10-27 | Discharge: 2023-10-27 | Disposition: A | Discharge status: Home / Self Care | Source: Ambulatory Visit | Attending: Obstetrics and Gynecology | Admitting: Obstetrics and Gynecology

## 2023-10-27 DIAGNOSIS — R9389 Abnormal findings on diagnostic imaging of other specified body structures: Secondary | ICD-10-CM | POA: Diagnosis not present

## 2023-11-04 DIAGNOSIS — E785 Hyperlipidemia, unspecified: Secondary | ICD-10-CM | POA: Diagnosis not present

## 2023-11-04 DIAGNOSIS — R809 Proteinuria, unspecified: Secondary | ICD-10-CM | POA: Diagnosis not present

## 2023-11-04 DIAGNOSIS — R768 Other specified abnormal immunological findings in serum: Secondary | ICD-10-CM | POA: Diagnosis not present

## 2023-11-04 DIAGNOSIS — E1129 Type 2 diabetes mellitus with other diabetic kidney complication: Secondary | ICD-10-CM | POA: Diagnosis not present

## 2023-11-04 DIAGNOSIS — I10 Essential (primary) hypertension: Secondary | ICD-10-CM | POA: Diagnosis not present

## 2023-11-08 DIAGNOSIS — M25561 Pain in right knee: Secondary | ICD-10-CM | POA: Diagnosis not present

## 2023-11-08 DIAGNOSIS — G8929 Other chronic pain: Secondary | ICD-10-CM | POA: Diagnosis not present

## 2023-11-08 DIAGNOSIS — M1711 Unilateral primary osteoarthritis, right knee: Secondary | ICD-10-CM | POA: Diagnosis not present

## 2023-11-11 ENCOUNTER — Other Ambulatory Visit: Payer: Self-pay | Admitting: Certified Nurse Midwife

## 2023-11-11 DIAGNOSIS — R768 Other specified abnormal immunological findings in serum: Secondary | ICD-10-CM | POA: Diagnosis not present

## 2023-11-11 DIAGNOSIS — E785 Hyperlipidemia, unspecified: Secondary | ICD-10-CM | POA: Diagnosis not present

## 2023-11-11 DIAGNOSIS — R809 Proteinuria, unspecified: Secondary | ICD-10-CM | POA: Diagnosis not present

## 2023-11-11 DIAGNOSIS — R9389 Abnormal findings on diagnostic imaging of other specified body structures: Secondary | ICD-10-CM

## 2023-11-11 DIAGNOSIS — I10 Essential (primary) hypertension: Secondary | ICD-10-CM | POA: Diagnosis not present

## 2023-11-11 DIAGNOSIS — E1129 Type 2 diabetes mellitus with other diabetic kidney complication: Secondary | ICD-10-CM | POA: Diagnosis not present

## 2023-11-11 NOTE — Progress Notes (Signed)
 Pharmacy refill request received for megestrol , per Dr. Ranny Bye note patient needs follow up u/s after 100m of megestrol , order placed.

## 2023-11-11 NOTE — Addendum Note (Signed)
 Addended by: Quince Bryant on: 11/11/2023 08:59 AM   Modules accepted: Orders

## 2023-11-16 DIAGNOSIS — M545 Low back pain, unspecified: Secondary | ICD-10-CM | POA: Diagnosis not present

## 2023-11-17 ENCOUNTER — Other Ambulatory Visit: Payer: Self-pay | Admitting: Nephrology

## 2023-11-17 DIAGNOSIS — E1165 Type 2 diabetes mellitus with hyperglycemia: Secondary | ICD-10-CM

## 2023-11-17 DIAGNOSIS — R809 Proteinuria, unspecified: Secondary | ICD-10-CM

## 2023-11-17 DIAGNOSIS — E785 Hyperlipidemia, unspecified: Secondary | ICD-10-CM

## 2023-11-17 DIAGNOSIS — I10 Essential (primary) hypertension: Secondary | ICD-10-CM

## 2023-11-19 ENCOUNTER — Other Ambulatory Visit (INDEPENDENT_AMBULATORY_CARE_PROVIDER_SITE_OTHER): Payer: Self-pay | Admitting: Nurse Practitioner

## 2023-11-19 DIAGNOSIS — I6523 Occlusion and stenosis of bilateral carotid arteries: Secondary | ICD-10-CM

## 2023-11-22 ENCOUNTER — Other Ambulatory Visit (INDEPENDENT_AMBULATORY_CARE_PROVIDER_SITE_OTHER): Payer: 59

## 2023-11-22 ENCOUNTER — Ambulatory Visit (INDEPENDENT_AMBULATORY_CARE_PROVIDER_SITE_OTHER): Payer: 59 | Admitting: Vascular Surgery

## 2023-11-22 DIAGNOSIS — I6523 Occlusion and stenosis of bilateral carotid arteries: Secondary | ICD-10-CM

## 2023-11-30 ENCOUNTER — Other Ambulatory Visit: Payer: Self-pay | Admitting: Radiology

## 2023-11-30 DIAGNOSIS — R809 Proteinuria, unspecified: Secondary | ICD-10-CM

## 2023-12-03 ENCOUNTER — Ambulatory Visit

## 2023-12-06 ENCOUNTER — Encounter (INDEPENDENT_AMBULATORY_CARE_PROVIDER_SITE_OTHER): Payer: Self-pay | Admitting: Vascular Surgery

## 2023-12-06 ENCOUNTER — Ambulatory Visit (INDEPENDENT_AMBULATORY_CARE_PROVIDER_SITE_OTHER): Admitting: Vascular Surgery

## 2023-12-06 VITALS — BP 161/79 | HR 62 | Resp 16 | Wt 109.8 lb

## 2023-12-06 DIAGNOSIS — E1159 Type 2 diabetes mellitus with other circulatory complications: Secondary | ICD-10-CM

## 2023-12-06 DIAGNOSIS — E782 Mixed hyperlipidemia: Secondary | ICD-10-CM

## 2023-12-06 DIAGNOSIS — M170 Bilateral primary osteoarthritis of knee: Secondary | ICD-10-CM

## 2023-12-06 DIAGNOSIS — I10 Essential (primary) hypertension: Secondary | ICD-10-CM

## 2023-12-06 DIAGNOSIS — I6523 Occlusion and stenosis of bilateral carotid arteries: Secondary | ICD-10-CM | POA: Diagnosis not present

## 2023-12-11 ENCOUNTER — Encounter (INDEPENDENT_AMBULATORY_CARE_PROVIDER_SITE_OTHER): Payer: Self-pay | Admitting: Vascular Surgery

## 2023-12-11 NOTE — Progress Notes (Signed)
 MRN : [id number]  Andrea Rogers is a 63 y.o. (November 09, 1960) female who presents with chief complaint of check carotid arteries.  History of Present Illness:   The patient is seen for follow up evaluation of carotid stenosis. The carotid stenosis followed by ultrasound.    The patient denies amaurosis fugax. There is no recent history of TIA symptoms or focal motor deficits. There is no prior documented CVA.   The patient is taking enteric-coated aspirin  81 mg daily.   There is no history of migraine headaches. There is no history of seizures.   No recent shortening of the patient's walking distance or new symptoms consistent with claudication.  No history of rest pain symptoms. No new ulcers or wounds of the lower extremities have occurred.   There is no history of DVT, PE or superficial thrombophlebitis. No documented recent episodes of angina or shortness of breath documented.    Duplex ultrasound of the carotid arteries done today shows RICA 1-39% stenosis and LICA 60-79% stenosis.  No change when compared to previous studies  Current Meds  Medication Sig   ALPRAZolam  (XANAX ) 0.25 MG tablet Take 0.25 mg by mouth at bedtime.   ALPRAZolam  (XANAX ) 0.5 MG tablet Take 0.5 mg by mouth at bedtime as needed for anxiety or sleep.   atenolol  (TENORMIN ) 100 MG tablet Take 100 mg by mouth daily.   cholecalciferol (VITAMIN D3) 25 MCG (1000 UNIT) tablet Take 1,000 Units by mouth daily.   docusate sodium  (COLACE) 100 MG capsule Take 100 mg by mouth at bedtime.   erythromycin ophthalmic ointment Place 1 Application into both eyes daily as needed.   ezetimibe  (ZETIA ) 10 MG tablet Take 10 mg by mouth at bedtime.    ferrous sulfate 325 (65 FE) MG EC tablet Take 325 mg by mouth daily with breakfast.   glimepiride (AMARYL) 1 MG tablet Take 1 mg by mouth daily.    HYDROcodone -acetaminophen  (NORCO) 7.5-325 MG tablet Take 1 tablet by mouth daily after supper.   levothyroxine   (SYNTHROID ) 100 MCG tablet Take 100 mcg by mouth daily before breakfast.   losartan (COZAAR) 50 MG tablet Take 50 mg by mouth daily.   MAGNESIUM PO Take 250 mg by mouth daily.   megestrol  (MEGACE ) 40 MG tablet Take 1 tablet (40 mg total) by mouth 2 (two) times daily.   metFORMIN (GLUCOPHAGE) 500 MG tablet Take 1,000 mg by mouth 2 (two) times daily with a meal.    omeprazole (PRILOSEC) 20 MG capsule Take 20 mg by mouth daily.   triamterene -hydrochlorothiazide  (MAXZIDE -25) 37.5-25 MG tablet Take 1 tablet by mouth daily.   vitamin B-12 (CYANOCOBALAMIN ) 500 MCG tablet Take 500 mcg by mouth daily.    Past Medical History:  Diagnosis Date   Anal condyloma    Arthritis    Bronchitis    GERD (gastroesophageal reflux disease)    Headache    HLD (hyperlipidemia)    Hypertension    Hypothyroidism    MD (muscular dystrophy) (HCC)    Menopause    Muscular dystrophy, limb girdle (HCC)    Osteopenia    Post-menopausal bleeding    Recurrent UTI    Restless leg syndrome    T2DM (type 2 diabetes mellitus) (HCC)    TIA (transient ischemic attack) 02/2019   Tobacco abuse     Past Surgical History:  Procedure Laterality Date  COLONOSCOPY N/A 10/14/2023   Procedure: COLONOSCOPY;  Surgeon: Onita Elspeth Sharper, DO;  Location: [facility] ENDOSCOPY;  Service: Gastroenterology;  Laterality: N/A;   COLONOSCOPY WITH PROPOFOL  N/A 04/25/2018   Procedure: COLONOSCOPY WITH PROPOFOL ;  Surgeon: Gaylyn Gladis PENNER, MD;  Location: [facility] ENDOSCOPY;  Service: Endoscopy;  Laterality: N/A;   DILATION AND CURETTAGE OF UTERUS     ENDARTERECTOMY Right 08/11/2019   Procedure: ENDARTERECTOMY CAROTID;  Surgeon: Jama Cordella MATSU, MD;  Location: [facility] ORS;  Service: Vascular;  Laterality: Right;   ESOPHAGOGASTRODUODENOSCOPY N/A 10/14/2023   Procedure: EGD (ESOPHAGOGASTRODUODENOSCOPY);  Surgeon: Onita Elspeth Sharper, DO;  Location: [facility] ENDOSCOPY;  Service: Gastroenterology;  Laterality: N/A;  DM   HYSTEROSCOPY WITH D & C N/A  07/12/2023   Procedure: HYSTEROSCOPY DILATATION AND CURETTAGE;  Surgeon: Connell Davies, MD;  Location: [facility] ORS;  Service: Gynecology;  Laterality: N/A;    Social History Social History   Tobacco Use   Smoking status: Every Day    Current packs/day: 1.00    Types: Cigarettes   Smokeless tobacco: Never  Vaping Use   Vaping status: Never Used  Substance Use Topics   Alcohol  use: No   Drug use: No    Family History Family History  Adopted: Yes  Problem Relation Age of Onset   Breast cancer Mother    Colon cancer Father    Diabetes Father     Allergies  Allergen Reactions   Latex Rash   Penicillins Other (See Comments)    Yeast infection  Did it involve swelling of the face/tongue/throat, SOB, or low BP? No  Did it involve sudden or severe rash/hives, skin peeling, or any reaction on the inside of your mouth or nose? No  Did you need to seek medical attention at a hospital or doctor's office? No  When did it last happen?        If all above answers are NO, may proceed with cephalosporin use.  Yeast infection Did it involve swelling of the face/tongue/throat, SOB, or low BP? No Did it involve sudden or severe rash/hives, skin peeling, or any reaction on the inside of your mouth or nose? No Did you need to seek medical attention at a hospital or doctor's office? No When did it last happen?       If all above answers are NO, may proceed with cephalosporin use.    Yeast infection     REVIEW OF SYSTEMS (Negative unless checked)  Constitutional: [] Weight loss  [] Fever  [] Chills Cardiac: [] Chest pain   [] Chest pressure   [] Palpitations   [] Shortness of breath when laying flat   [] Shortness of breath with exertion. Vascular:  [x] Pain in legs with walking   [] Pain in legs at rest  [] History of DVT   [] Phlebitis   [] Swelling in legs   [] Varicose veins   [] Non-healing ulcers Pulmonary:   [] Uses home oxygen   [] Productive cough   [] Hemoptysis   [] Wheeze  [] COPD    [] Asthma Neurologic:  [] Dizziness   [] Seizures   [] History of stroke   [] History of TIA  [] Aphasia   [] Vissual changes   [] Weakness or numbness in arm   [] Weakness or numbness in leg Musculoskeletal:   [] Joint swelling   [] Joint pain   [] Low back pain Hematologic:  [] Easy bruising  [] Easy bleeding   [] Hypercoagulable state   [] Anemic Gastrointestinal:  [] Diarrhea   [] Vomiting  [] Gastroesophageal reflux/heartburn   [] Difficulty swallowing. Genitourinary:  [] Chronic kidney disease   [] Difficult urination  [] Frequent urination   [] Blood  in urine Skin:  [] Rashes   [] Ulcers  Psychological:  [] History of anxiety   []  History of major depression.  Physical Examination  Vitals:   12/06/23 1443  BP: (!) 161/79  Pulse: 62  Resp: 16  Weight: 109 lb 12.8 oz (49.8 kg)   Body mass index is 18.85 kg/m. Gen: WD/WN, NAD Head: [place]/AT, No temporalis wasting.  Ear/Nose/Throat: Hearing grossly intact, nares w/o erythema or drainage Eyes: PER, EOMI, sclera nonicteric.  Neck: Supple, no masses.  No bruit or JVD.  Pulmonary:  Good air movement, no audible wheezing, no use of accessory muscles.  Cardiac: RRR, normal S1, S2, no Murmurs. Vascular:  carotid bruit noted Vessel Right Left  Radial Palpable Palpable  Carotid  Palpable  Palpable  Gastrointestinal: soft, non-distended. No guarding/no peritoneal signs.  Musculoskeletal: M/S 5/5 throughout.  No visible deformity.  Neurologic: CN 2-12 intact. Pain and light touch intact in extremities.  Symmetrical.  Speech is fluent. Motor exam as listed above. Psychiatric: Judgment intact, Mood & affect appropriate for pt's clinical situation. Dermatologic: No rashes or ulcers noted.  No changes consistent with cellulitis.   CBC Lab Results  Component Value Date   WBC 10.1 07/06/2023   HGB 15.9 (H) 07/06/2023   HCT 47.7 (H) 07/06/2023   MCV 99.0 07/06/2023   PLT 290 07/06/2023    BMET    Component Value Date/Time   NA 133 (L) 12/02/2022 0435   K 3.8  12/02/2022 0435   CL 97 (L) 12/02/2022 0435   CO2 24 12/02/2022 0435   GLUCOSE 115 (H) 12/02/2022 0435   BUN 27 (H) 12/02/2022 0435   CREATININE 0.70 12/02/2022 0435   CALCIUM 9.1 12/02/2022 0435   GFRNONAA >60 12/02/2022 0435   GFRAA NOT CALCULATED 08/12/2019 0426   CrCl cannot be calculated (Patient's most recent lab result is older than the maximum 21 days allowed.).  COAG Lab Results  Component Value Date   INR 0.9 08/09/2019    Radiology VAS US  CAROTID Result Date: 11/24/2023 Carotid Arterial Duplex Study Patient Name:  [facility] GORMAN NOVAK  Date of Exam:   11/22/2023 Medical Rec #: [id number]   Accession #:    [id number] Date of Birth: 11/19/1960   Patient Gender: F Patient Age:   63 years Exam Location:  [facility] Procedure:      VAS US  CAROTID Referring Phys: ORVIN DARING --------------------------------------------------------------------------------  Indications:   Carotid artery disease. Other Factors: Right CEA in 2021. Performing Technologist: Elsie Churn RT, RDMS, RVT  Examination Guidelines: A complete evaluation includes B-mode imaging, spectral Doppler, color Doppler, and power Doppler as needed of all accessible portions of each vessel. Bilateral testing is considered an integral part of a complete examination. Limited examinations for reoccurring indications may be performed as noted.  Right Carotid Findings: +----------+--------+--------+--------+------------------+------------------+           PSV cm/sEDV cm/sStenosisPlaque DescriptionComments           +----------+--------+--------+--------+------------------+------------------+ CCA Prox  110     10                                                   +----------+--------+--------+--------+------------------+------------------+ CCA Mid   96      12                                                   +----------+--------+--------+--------+------------------+------------------+  CCA Distal103     13                                 intimal thickening +----------+--------+--------+--------+------------------+------------------+ ICA Prox  120     18                                                   +----------+--------+--------+--------+------------------+------------------+ ICA Mid   89      12                                                   +----------+--------+--------+--------+------------------+------------------+ ICA Distal78      11                                                   +----------+--------+--------+--------+------------------+------------------+ ECA       124                                                          +----------+--------+--------+--------+------------------+------------------+ +----------+--------+-------+----------------+-------------------+           PSV cm/sEDV cmsDescribe        Arm Pressure (mmHG) +----------+--------+-------+----------------+-------------------+ [id number]            Multiphasic, WNL                    +----------+--------+-------+----------------+-------------------+ +---------+--------+--+--------+---------+ VertebralPSV cm/s61EDV cm/sAntegrade +---------+--------+--+--------+---------+  Left Carotid Findings: +----------+--------+--------+--------+----------------------+-----------------+           PSV cm/sEDV cm/sStenosisPlaque Description    Comments          +----------+--------+--------+--------+----------------------+-----------------+ CCA Prox  89      13                                                      +----------+--------+--------+--------+----------------------+-----------------+ CCA Mid   65      13                                                      +----------+--------+--------+--------+----------------------+-----------------+ CCA Distal81      14                                                       +----------+--------+--------+--------+----------------------+-----------------+ ICA Prox  251     46      60-79%  calcific and irregularICA/CCA ratio=3.1 +----------+--------+--------+--------+----------------------+-----------------+ ICA  Mid   131     25                                                      +----------+--------+--------+--------+----------------------+-----------------+ ICA Distal54      16                                                      +----------+--------+--------+--------+----------------------+-----------------+ ECA       355             >50%    calcific                                +----------+--------+--------+--------+----------------------+-----------------+ +----------+--------+--------+--------+-------------------+           PSV cm/sEDV cm/sDescribeArm Pressure (mmHG) +----------+--------+--------+--------+-------------------+ [id number]             Stenotic                    +----------+--------+--------+--------+-------------------+ +---------+--------+--+--------+---------+ VertebralPSV cm/s57EDV cm/sAntegrade +---------+--------+--+--------+---------+  Summary: Right Carotid: The extracranial vessels were near-normal with only minimal wall                thickening or plaque. Left Carotid: Velocities in the left ICA are consistent with a 60-79% stenosis.               The ECA appears >50% stenosed. Evidence of significant               proximal/mid SCA stenosis. No significant change in the bilateral carotid artery velocities when compared to the previous exam on 03/03/23. *See table(s) above for measurements and observations.  Electronically signed by Cordella Shawl MD on 11/24/2023 at 3:15:24 PM.    Final      Assessment/Plan 1. Carotid artery calcification, bilateral (Primary) Recommend:   Given the patient's asymptomatic subcritical stenosis no further invasive testing or surgery at this time.   Duplex ultrasound of  the carotid arteries done today shows RICA 1-39% stenosis and LICA 60-79% stenosis.  No change when compared to previous studies   Continue antiplatelet therapy as prescribed Continue management of CAD, HTN and Hyperlipidemia Healthy heart diet,  encouraged exercise at least 4 times per week Follow up in 9 months with duplex ultrasound and physical exam  - VAS US  CAROTID; Future  2. Essential hypertension Continue antihypertensive medications as already ordered, these medications have been reviewed and there are no changes at this time.  3. Mixed hyperlipidemia Continue statin as ordered and reviewed, no changes at this time  4. Primary osteoarthritis of both knees Continue medications to treat the patient's degenerative disease as already ordered, these medications have been reviewed and there are no changes at this time.  Continued activity and therapy was stressed.  5. Controlled type 2 diabetes mellitus with other circulatory complication, without long-term current use of insulin  (HCC) Continue hypoglycemic medications as already ordered, these medications have been reviewed and there are no changes at this time.  Hgb A1C to be monitored as already arranged by primary service    Cordella Shawl, MD  12/11/2023 2:30 PM

## 2023-12-28 ENCOUNTER — Encounter: Payer: Self-pay | Admitting: Physician Assistant

## 2023-12-28 ENCOUNTER — Inpatient Hospital Stay
Admission: RE | Admit: 2023-12-28 | Discharge: 2023-12-28 | Disposition: A | Discharge status: Home / Self Care | Payer: Self-pay | Source: Ambulatory Visit | Attending: Physician Assistant | Admitting: Physician Assistant

## 2023-12-28 ENCOUNTER — Ambulatory Visit
Admission: RE | Admit: 2023-12-28 | Discharge: 2023-12-28 | Disposition: A | Discharge status: Home / Self Care | Attending: Physician Assistant | Admitting: Physician Assistant

## 2023-12-28 ENCOUNTER — Ambulatory Visit
Admission: RE | Admit: 2023-12-28 | Discharge: 2023-12-28 | Disposition: A | Discharge status: Home / Self Care | Source: Ambulatory Visit | Attending: Physician Assistant | Admitting: Physician Assistant

## 2023-12-28 ENCOUNTER — Ambulatory Visit (INDEPENDENT_AMBULATORY_CARE_PROVIDER_SITE_OTHER): Admitting: Physician Assistant

## 2023-12-28 VITALS — BP 146/78 | Ht 64.0 in | Wt 109.0 lb

## 2023-12-28 DIAGNOSIS — S22088D Other fracture of T11-T12 vertebra, subsequent encounter for fracture with routine healing: Secondary | ICD-10-CM

## 2023-12-28 DIAGNOSIS — G7100 Muscular dystrophy, unspecified: Secondary | ICD-10-CM

## 2023-12-28 DIAGNOSIS — W109XXA Fall (on) (from) unspecified stairs and steps, initial encounter: Secondary | ICD-10-CM | POA: Diagnosis not present

## 2023-12-28 DIAGNOSIS — Z049 Encounter for examination and observation for unspecified reason: Secondary | ICD-10-CM | POA: Insufficient documentation

## 2023-12-28 DIAGNOSIS — S32019D Unspecified fracture of first lumbar vertebra, subsequent encounter for fracture with routine healing: Secondary | ICD-10-CM | POA: Insufficient documentation

## 2023-12-28 DIAGNOSIS — M4856XA Collapsed vertebra, not elsewhere classified, lumbar region, initial encounter for fracture: Secondary | ICD-10-CM | POA: Diagnosis not present

## 2023-12-28 NOTE — Progress Notes (Unsigned)
 Referring Physician:  Sadie Manna, MD [address] [facility] [place],  [place] [address]  Primary Physician:  Sadie Manna, MD  History of Present Illness: 12/28/2023 Andrea Rogers with significant past medical history of muscular dystrophy and possible new compression fracture with recent history of  epidural hematoma within the past year complains today for follow-up on new fracture sustained approximately 2 months ago after falling on her buttock when missing a step.  Her pain has improved some, she never was given a brace.  No new weakness, numbness or tingling.     Weakness: none Bowel/Bladder Dysfunction: none The symptoms are causing a significant impact on the patient's life.   Review of Systems:  A 10 point review of systems is negative, except for the pertinent positives and negatives detailed in the HPI.  Past Medical History: Past Medical History:  Diagnosis Date   Anal condyloma    Arthritis    Bronchitis    GERD (gastroesophageal reflux disease)    Headache    HLD (hyperlipidemia)    Hypertension    Hypothyroidism    MD (muscular dystrophy) (HCC)    Menopause    Muscular dystrophy, limb girdle (HCC)    Osteopenia    Post-menopausal bleeding    Recurrent UTI    Restless leg syndrome    T2DM (type 2 diabetes mellitus) (HCC)    TIA (transient ischemic attack) 02/2019   Tobacco abuse     Past Surgical History: Past Surgical History:  Procedure Laterality Date   COLONOSCOPY N/A 10/14/2023   Procedure: COLONOSCOPY;  Surgeon: Onita Elspeth Sharper, DO;  Location: [facility] ENDOSCOPY;  Service: Gastroenterology;  Laterality: N/A;   COLONOSCOPY WITH PROPOFOL  N/A 04/25/2018   Procedure: COLONOSCOPY WITH PROPOFOL ;  Surgeon: Gaylyn Gladis PENNER, MD;  Location: [facility] ENDOSCOPY;  Service: Endoscopy;  Laterality: N/A;   DILATION AND CURETTAGE OF UTERUS     ENDARTERECTOMY Right 08/11/2019   Procedure: ENDARTERECTOMY CAROTID;  Surgeon: Jama Cordella MATSU, MD;  Location: [facility] ORS;  Service: Vascular;  Laterality: Right;   ESOPHAGOGASTRODUODENOSCOPY N/A 10/14/2023   Procedure: EGD (ESOPHAGOGASTRODUODENOSCOPY);  Surgeon: Onita Elspeth Sharper, DO;  Location: [facility] ENDOSCOPY;  Service: Gastroenterology;  Laterality: N/A;  DM   HYSTEROSCOPY WITH D & C N/A 07/12/2023   Procedure: HYSTEROSCOPY DILATATION AND CURETTAGE;  Surgeon: Connell Davies, MD;  Location: [facility] ORS;  Service: Gynecology;  Laterality: N/A;    Allergies: Allergies as of 12/28/2023 - Review Complete 12/28/2023  Allergen Reaction Noted   Latex Rash 04/09/2015   Penicillins Other (See Comments) 10/26/2013    Medications: Outpatient Encounter Medications as of 12/28/2023  Medication Sig   albuterol  (VENTOLIN  HFA) 108 (90 Base) MCG/ACT inhaler Inhale 2 puffs into the lungs.   ALPRAZolam  (XANAX ) 0.25 MG tablet Take 0.25 mg by mouth at bedtime.   ALPRAZolam  (XANAX ) 0.5 MG tablet Take 0.5 mg by mouth at bedtime as needed for anxiety or sleep.   aspirin  EC 81 MG tablet Take 81 mg by mouth.   atenolol  (TENORMIN ) 100 MG tablet Take 100 mg by mouth daily.   cholecalciferol (VITAMIN D3) 25 MCG (1000 UNIT) tablet Take 1,000 Units by mouth daily.   docusate sodium  (COLACE) 100 MG capsule Take 100 mg by mouth at bedtime.   erythromycin ophthalmic ointment Place 1 Application into both eyes daily as needed.   ezetimibe  (ZETIA ) 10 MG tablet Take 10 mg by mouth at bedtime.    ferrous sulfate 325 (65 FE) MG EC tablet Take 325  mg by mouth daily with breakfast.   glimepiride (AMARYL) 1 MG tablet Take 1 mg by mouth daily.    HYDROcodone -acetaminophen  (NORCO) 7.5-325 MG tablet Take 1 tablet by mouth daily after supper.   levothyroxine  (SYNTHROID ) 100 MCG tablet Take 100 mcg by mouth daily before breakfast.   losartan (COZAAR) 50 MG tablet Take 50 mg by mouth daily.   MAGNESIUM PO Take 250 mg by mouth daily.   metFORMIN (GLUCOPHAGE) 500 MG tablet Take 1,000 mg by mouth 2 (two) times daily with  a meal.    omeprazole (PRILOSEC) 20 MG capsule Take 20 mg by mouth daily.   triamterene -hydrochlorothiazide  (MAXZIDE -25) 37.5-25 MG tablet Take 1 tablet by mouth daily.   vitamin B-12 (CYANOCOBALAMIN ) 500 MCG tablet Take 500 mcg by mouth daily.   megestrol  (MEGACE ) 40 MG tablet Take 1 tablet (40 mg total) by mouth 2 (two) times daily.   [DISCONTINUED] acetaminophen  (TYLENOL ) 500 MG tablet Take 2 tablets (1,000 mg total) by mouth every 6 (six) hours as needed.   [DISCONTINUED] conjugated estrogens  (PREMARIN ) vaginal cream Place 1 Applicatorful vaginally at bedtime. Apply nightly to vulva for 2 weeks.   [DISCONTINUED] valACYclovir  (VALTREX ) 1000 MG tablet Take 1 tablet (1,000 mg total) by mouth daily.   No facility-administered encounter medications on file as of 12/28/2023.    Social History: Social History   Tobacco Use   Smoking status: Every Day    Current packs/day: 1.00    Types: Cigarettes   Smokeless tobacco: Never  Vaping Use   Vaping status: Never Used  Substance Use Topics   Alcohol  use: No   Drug use: No    Family Medical History: Family History  Adopted: Yes  Problem Relation Age of Onset   Breast cancer Mother    Colon cancer Father    Diabetes Father     Physical Examination: @VITALWITHPAIN @  General: Patient is well developed, well nourished, calm, collected, and in no apparent distress. Attention to examination is appropriate.  Psychiatric: Patient is non-anxious.  Head:  Pupils equal, round, and reactive to light.  ENT:  Oral mucosa appears well hydrated.  Neck:   Supple.    Respiratory: Patient is breathing without any difficulty.  Extremities: No edema.  Vascular: Palpable dorsal pedal pulses.  Skin:   On exposed skin, there are no abnormal skin lesions.  NEUROLOGICAL:     Awake, alert, oriented to person, place, and time.  Speech is clear and fluent. Fund of knowledge is appropriate.   Cranial Nerves: Pupils equal round and reactive to  light.  Facial tone is symmetric.   ROM of spine: Minimal tenderness palpation of lumbar spine.  Strength: Good antigravity strength throughout bilateral lower extremities.  Clonus is not present.  Toes are down-going.  Gait is not tandem, but to patient's baseline.  Medical Decision Making  Imaging: FINDINGS: There are mild compression fractures of T12 and L1, without change from the CT dated 12/01/2022 or the follow-up MRI. L1 fracture was new based on the prior MRI.   No other fractures.  No bone lesions.  No spondylolisthesis.   Lumbar disc spaces are well preserved.   Scattered aortic atherosclerotic calcifications. Soft tissues are otherwise unremarkable.   IMPRESSION: 1. No change in the severity of the L1 compression fracture compared to the exams from 12/01/2022. 2. Chronic and stable mild compression fracture of T12. 3. No new fractures.  No malalignment.  I have personally reviewed the images and agree with the above interpretation.  Assessment and  Plan: Ms. Delahunty is a pleasant 63 y.o. female with significant past medical history of muscular dystrophy and recent compression fracture with epidural hematoma within the past year complains today for follow-up on new fracture sustained approximately 2 months ago after falling on her buttock when missing a step.  Her pain has improved some, she never was given a brace.  No new weakness, numbness or tingling.  On examination, she is to baseline.  Plan moving forward includes the following:   - Thoracolumbar x-rays today - Bone density scan ordered to evaluate for osteoporosis or osteopenia. - MRI of lumbar spine given new fall with new fracture and increased pain.  Patient states that she would potentially be interested in a kyphoplasty in the future if available.   Thank you for involving me in the care of this patient.   I spent a total of 30 minutes in both face-to-face and non-face-to-face activities for this visit  on the date of this encounter including preparing to see the patient, obtaining and reviewing separately obtained history, performing medically appropriate examination, counseling the patient and their family, ordering additional tests, documenting clinical information.  Lyle Decamp, PA-C Dept. of Neurosurgery

## 2023-12-29 ENCOUNTER — Encounter: Payer: Self-pay | Admitting: Neurosurgery

## 2023-12-30 ENCOUNTER — Other Ambulatory Visit: Payer: Self-pay | Admitting: Physician Assistant

## 2023-12-30 MED ORDER — HYDROCODONE-ACETAMINOPHEN 7.5-325 MG PO TABS: 1.0000 | ORAL_TABLET | Freq: Every day | ORAL | 0 refills | Status: DC

## 2023-12-31 ENCOUNTER — Other Ambulatory Visit: Payer: Self-pay | Admitting: Physician Assistant

## 2023-12-31 MED ORDER — HYDROCODONE-ACETAMINOPHEN 5-325 MG PO TABS: 1.0000 | ORAL_TABLET | Freq: Four times a day (QID) | ORAL | 0 refills | Status: AC | PRN

## 2023-12-31 NOTE — Telephone Encounter (Signed)
 Spoke with [facility] and canceled the norco 7.5. Advised them it is OK to fill the new rx for 5mg  for her to use during the day and that the 7.5mg  that she gets from Dr Sadie is her nighttime dose. They were able to fill it.

## 2023-12-31 NOTE — Telephone Encounter (Signed)
 I spoke with the patient and notified her of the change.

## 2024-01-05 DIAGNOSIS — R768 Other specified abnormal immunological findings in serum: Secondary | ICD-10-CM | POA: Diagnosis not present

## 2024-01-05 DIAGNOSIS — R809 Proteinuria, unspecified: Secondary | ICD-10-CM | POA: Diagnosis not present

## 2024-01-06 ENCOUNTER — Ambulatory Visit
Admission: RE | Admit: 2024-01-06 | Discharge: 2024-01-06 | Disposition: A | Discharge status: Home / Self Care | Source: Ambulatory Visit | Attending: Physician Assistant | Admitting: Physician Assistant

## 2024-01-06 DIAGNOSIS — S32019D Unspecified fracture of first lumbar vertebra, subsequent encounter for fracture with routine healing: Secondary | ICD-10-CM

## 2024-01-06 DIAGNOSIS — S32038A Other fracture of third lumbar vertebra, initial encounter for closed fracture: Secondary | ICD-10-CM | POA: Diagnosis not present

## 2024-01-06 DIAGNOSIS — S22088D Other fracture of T11-T12 vertebra, subsequent encounter for fracture with routine healing: Secondary | ICD-10-CM

## 2024-01-10 DIAGNOSIS — R768 Other specified abnormal immunological findings in serum: Secondary | ICD-10-CM | POA: Diagnosis not present

## 2024-01-10 DIAGNOSIS — R634 Abnormal weight loss: Secondary | ICD-10-CM | POA: Diagnosis not present

## 2024-01-10 DIAGNOSIS — Z23 Encounter for immunization: Secondary | ICD-10-CM | POA: Diagnosis not present

## 2024-01-10 DIAGNOSIS — I10 Essential (primary) hypertension: Secondary | ICD-10-CM | POA: Diagnosis not present

## 2024-01-10 DIAGNOSIS — G2581 Restless legs syndrome: Secondary | ICD-10-CM | POA: Diagnosis not present

## 2024-01-10 DIAGNOSIS — R809 Proteinuria, unspecified: Secondary | ICD-10-CM | POA: Diagnosis not present

## 2024-01-10 DIAGNOSIS — R7989 Other specified abnormal findings of blood chemistry: Secondary | ICD-10-CM | POA: Diagnosis not present

## 2024-01-10 DIAGNOSIS — M170 Bilateral primary osteoarthritis of knee: Secondary | ICD-10-CM | POA: Diagnosis not present

## 2024-01-10 DIAGNOSIS — E1165 Type 2 diabetes mellitus with hyperglycemia: Secondary | ICD-10-CM | POA: Diagnosis not present

## 2024-01-10 DIAGNOSIS — G71039 Limb girdle muscular dystrophy, unspecified: Secondary | ICD-10-CM | POA: Diagnosis not present

## 2024-01-17 DIAGNOSIS — Z1331 Encounter for screening for depression: Secondary | ICD-10-CM | POA: Diagnosis not present

## 2024-01-17 DIAGNOSIS — E1165 Type 2 diabetes mellitus with hyperglycemia: Secondary | ICD-10-CM | POA: Diagnosis not present

## 2024-01-17 DIAGNOSIS — G71039 Limb girdle muscular dystrophy, unspecified: Secondary | ICD-10-CM | POA: Diagnosis not present

## 2024-01-17 DIAGNOSIS — E039 Hypothyroidism, unspecified: Secondary | ICD-10-CM | POA: Diagnosis not present

## 2024-01-17 DIAGNOSIS — S32030D Wedge compression fracture of third lumbar vertebra, subsequent encounter for fracture with routine healing: Secondary | ICD-10-CM | POA: Diagnosis not present

## 2024-01-17 DIAGNOSIS — I10 Essential (primary) hypertension: Secondary | ICD-10-CM | POA: Diagnosis not present

## 2024-01-17 DIAGNOSIS — E119 Type 2 diabetes mellitus without complications: Secondary | ICD-10-CM | POA: Diagnosis not present

## 2024-01-17 DIAGNOSIS — R809 Proteinuria, unspecified: Secondary | ICD-10-CM | POA: Diagnosis not present

## 2024-01-17 DIAGNOSIS — Z0000 Encounter for general adult medical examination without abnormal findings: Secondary | ICD-10-CM | POA: Diagnosis not present

## 2024-01-17 DIAGNOSIS — Z794 Long term (current) use of insulin: Secondary | ICD-10-CM | POA: Diagnosis not present

## 2024-01-17 DIAGNOSIS — F17210 Nicotine dependence, cigarettes, uncomplicated: Secondary | ICD-10-CM | POA: Diagnosis not present

## 2024-01-19 ENCOUNTER — Ambulatory Visit: Payer: Self-pay | Admitting: Physician Assistant

## 2024-04-13 DIAGNOSIS — F17200 Nicotine dependence, unspecified, uncomplicated: Secondary | ICD-10-CM | POA: Diagnosis not present

## 2024-04-13 DIAGNOSIS — R0781 Pleurodynia: Secondary | ICD-10-CM | POA: Diagnosis not present

## 2024-04-13 DIAGNOSIS — R918 Other nonspecific abnormal finding of lung field: Secondary | ICD-10-CM | POA: Diagnosis not present

## 2024-04-13 DIAGNOSIS — J432 Centrilobular emphysema: Secondary | ICD-10-CM | POA: Diagnosis not present

## 2024-04-13 DIAGNOSIS — F17210 Nicotine dependence, cigarettes, uncomplicated: Secondary | ICD-10-CM | POA: Diagnosis not present

## 2024-05-10 ENCOUNTER — Other Ambulatory Visit: Payer: Self-pay | Admitting: Cardiology

## 2024-05-10 ENCOUNTER — Ambulatory Visit
Admission: RE | Admit: 2024-05-10 | Discharge: 2024-05-10 | Disposition: A | Discharge status: Home / Self Care | Source: Ambulatory Visit | Attending: Cardiology | Admitting: Cardiology

## 2024-05-10 DIAGNOSIS — M79605 Pain in left leg: Secondary | ICD-10-CM | POA: Insufficient documentation

## 2024-05-10 NOTE — Progress Notes (Signed)
 Established Patient Visit   Chief Complaint: Chief Complaint  Patient presents with   Follow-up    9 month    Date of Service: 05/10/2024 Date of Birth: 1961-05-06 PCP: Andrea Tamra Cal, MD  History of Present Illness: [name] is a 63 y.o.female patient who returns for   1.  Essential hypertension  2.  Limb-girdle muscular dystrophy  3.  Type 2 diabetes  4.  Vasovagal syncope  5.  COPD / ongoing tobacco use  6.   Hyperlipidemia  7.  TIA 02/17/2019  8.  Status post right carotid endarterectomy 08/11/2019  The patient was seen at [facility] emergency room 02/17/2019 following an apparent TIA with visual disturbance and brief expressive aphasia.  CT of the head revealed chronic small vessel changes. 72-hour Holter monitor was performed which revealed predominant sinus rhythm with mean heart rate of 73 bpm, occasional premature ventricular contractions, and no episodes of atrial fibrillation.  Carotid ultrasound was performed which revealed 50 to 69% stenosis right and left internal carotid arteries.   The patient returns today for follow-up and reports doing okay.  She denies chest pain.  She has mild chronic exertional shortness of breath, which is unchanged, due to underlying COPD and ongoing tobacco abuse.  Patient continues smokes a pack cigarettes per day.   She denies palpitations or heart racing.  She denies peripheral edema.  She does report left calf pain times several days.  The patient is active but does not exercise regularly.  Lexiscan Myoview study  04/10/2016 revealed LV ejection fraction of 69% out evidence for scar or ischemia.  The patient underwent successful right carotid endarterectomy on 08/11/2019.  The patient has essential hypertension, blood pressure well controlled on atenolol  and triamterene /hydrochlorothiazide  which are well tolerated without apparent side effects.  The patient tries to follow a low-sodium, no added salt diet.  The patient has type 2 diabetes,  hemoglobin A1c was improved to 6.4 on 12/18/2019, currently on metformin and glimepiride, which are well tolerated without apparent side effects, followed by her primary care provider.   The patient has hyperlipidemia, currently on Zetia , which is well-tolerated without apparent side effects, and followed by her primary care provider. LDL cholesterol 142 on 05/04/2023.  Past Medical and Surgical History  Past Medical History Past Medical History:  Diagnosis Date   Allergic state    Chronic gastritis 02/27/14   Chronic headaches    Colon polyp 02/27/14   serrated adenoma   Diverticulosis 02/27/14   Erosive esophagitis 02/27/14   Essential hypertension, benign    Gastric ulcer 02/27/14   non-obstructing with clean base   Gastritis 06/25/2014   Gastroesophageal reflux disease with esophagitis 06/25/2014   Gastropathy 06/25/2014   Irregular Z line of esophagus 06/25/2014   Muscular dystrophy, limb girdle (CMS/HHS-HCC)    Osteoarthrosis, unspecified whether generalized or localized, lower leg    Other and unspecified hyperlipidemia    Tobacco use    Type II or unspecified type diabetes mellitus without mention of complication, not stated as uncontrolled (CMS/HHS-HCC)    Unspecified hypothyroidism     Past Surgical History She has a past surgical history that includes Colonoscopy (02/27/2014); egd (02/27/2014); egd (06/25/2014); Muscle Biopsy; Colonoscopy (04/25/2018); Colon @ [facility] (10/14/2023); and EGD @ [facility] (10/14/2023).   Medications and Allergies  Current Medications  Current Outpatient Medications  Medication Sig Dispense Refill   ALPRAZolam  (XANAX ) 0.25 MG tablet Take 1 tablet (0.25 mg total) by mouth at bedtime as needed for Sleep for up to  60 days 30 tablet 1   aspirin  81 MG EC tablet Take 81 mg by mouth once daily     atenoloL  (TENORMIN ) 100 MG tablet TAKE 2 TABLETS (200 MG TOTAL) BY MOUTH ONCE DAILY FOR 180 DAYS 180 tablet 1   ezetimibe  (ZETIA ) 10 mg tablet TAKE  1 TABLET BY MOUTH EVERY DAY 90 tablet 1   ferrous sulfate 325 (65 FE) MG tablet TAKE 1 TABLET BY MOUTH EVERY DAY WITH BREAKFAST 30 tablet 11   glimepiride (AMARYL) 1 MG tablet TAKE 1 TABLET BY MOUTH EVERY DAY WITH BREAKFAST 90 tablet 1   HYDROcodone -acetaminophen  (NORCO) 7.5-325 mg tablet Take 1 tablet by mouth once daily as needed for Pain 30 tablet 0   levothyroxine  (SYNTHROID ) 100 MCG tablet TAKE 1 TABLET (100 MCG TOTAL) BY MOUTH EVERY MORNING BEFORE BREAKFAST (0630) FOR 180 DAYS TAKE ON AN EMPTY STOMACH WITH A GLASS OF WATER AT LEAST 30-60 MINUTES BEFORE BREAKFAST 90 tablet 1   losartan (COZAAR) 50 MG tablet Take 1 tablet (50 mg total) by mouth 2 (two) times daily 180 tablet 1   magnesium 250 mg Tab Take 1 tablet by mouth once daily     metFORMIN (GLUCOPHAGE) 500 MG tablet TAKE 2 TABLETS BY MOUTH TWICE A DAY 360 tablet 1   omeprazole (PRILOSEC) 20 MG DR capsule Take 1 capsule (20 mg total) by mouth once daily 90 capsule 3   sodium, potassium, and magnesium (SUPREP) oral solution Take 1 Bottle by mouth as directed One kit contains 2 bottles.  Take both bottles at the times instructed by your provider. 354 mL 0   triamterene -hydroCHLOROthiazide  (MAXZIDE -25) 37.5-25 mg tablet TAKE 1 TABLET BY MOUTH EVERY DAY 90 tablet 1   albuterol  MDI, PROVENTIL , VENTOLIN , PROAIR , HFA 90 mcg/actuation inhaler Inhale 2 inhalations into the lungs every 6 (six) hours as needed for Wheezing for up to 90 days 2 each 2   No current facility-administered medications for this visit.    Allergies: Latex and Penicillins  Social and Family History  Social History  reports that she has been smoking cigarettes. She has a 30 pack-year smoking history. She has never used smokeless tobacco. She reports that she does not drink alcohol  and does not use drugs.  Family History Family History  Adopted: Yes  Problem Relation Name Age of Onset   Alzheimer's disease Mother birth mother    Aneurysm Mother birth  mother    Diabetes type II Father birth father    Coronary Artery Disease (Blocked arteries around heart) Father birth father    Diabetes Father birth father    Heart failure Father birth father    Dementia Father birth father    Atrial fibrillation (Abnormal heart rhythm sometimes requiring treatment with blood thinners) Father birth father    Colon cancer Father birth father    Breast cancer Adoptive Mother     Rheum arthritis Adoptive Father     Macular degeneration Adoptive Father     Heart disease Adoptive Father      Review of Systems   Review of Systems: The patient denies chest pain, with mild chronic exertional shortness of breath, without orthopnea, paroxysmal nocturnal dyspnea, with chronic pedal edema, without palpitations, heart racing, presyncope, syncope, with restless legs, with muscle weakness, with nausea, with ear pain, with muffled hearing. Review of 10 Systems is negative except as described above.  Physical Examination   Vitals: BP 124/80   Pulse 66   Ht 162.6 cm (5' 4)   Wt  51.5 kg (113 lb 9.6 oz)   LMP  (LMP Unknown)   SpO2 97%   BMI 19.50 kg/m  Ht:162.6 cm (5' 4) Wt:51.5 kg (113 lb 9.6 oz) ADJ:Anib surface area is 1.52 meters squared. Body mass index is 19.5 kg/m.  General: Alert and oriented. Well-appearing. No acute distress. HEENT: Pupils equally reactive to light and accomodation    Neck: Supple, no JVD Lungs: Normal effort of breathing; clear to auscultation bilaterally; no wheezes, rales, rhonchi Heart: Regular rate and rhythm. No murmur, rub, or gallop Abdomen: nontender, nondistended Extremities: No peripheral edema Peripheral Pulses: 2+ radial  Skin: Warm, dry, no diaphoresis  Assessment   63 y.o. female with  1. Other cardiac arrhythmia   2. Essential hypertension   3. Heart palpitations   4. Centrilobular emphysema (CMS/HHS-HCC)   5. Controlled type 2 diabetes mellitus without complication, with long-term current use  of insulin  (CMS/HHS-HCC)   6. Diabetes mellitus type 2, controlled (CMS-HCC)   7. Hyperlipidemia, unspecified hyperlipidemia type   8. Statin intolerance   9. Tobacco abuse   10. TIA (transient ischemic attack)    63 year old female with limb-girdle muscular dystrophy, with history of syncope, which sounded vasovagal in nature, with negative prior Holter monitor, without recurrence. Lexiscan Myoview revealed normal left ventricular function without evidence for scar or ischemia. The patient has essential hypertension, blood pressure well controlled on current BP medications. The patient has hyperlipidemia, currently on Zetia . The patient recently underwent a sleep study per Dr. Maree, which revealed mild, asymptomatic OSA, insufficient to warrant a CPAP.   Patient was seen at [facility] ED 02/17/2019 for apparent TIA.  72-hour Holter monitor revealed predominant sinus rhythm with mean heart rate of 73 bpm, occasional premature ventricular contractions, with no episodes of atrial fibrillation or atrial flutter.  Patient underwent successful right carotid endarterectomy 08/11/2019.  Patient complains of left calf pain.   Plan   1.  Continue current medications 2.  Continue low-sodium diet 3.  DASH diet printed instructions given to the patient 4.  Continue low-cholesterol diet 5.  Heart healthy diet printed instructions given to the patient 6.  Strongly recommend smoking cessation 7.  Left lower extremity ultrasound to rule out DVT 8.  Return to clinic for follow-up in 9 months   No orders of the defined types were placed in this encounter.   Return in about 9 months (around 02/08/2025).  MARSA DOOMS, MD PhD [facility]

## 2024-12-07 ENCOUNTER — Ambulatory Visit (INDEPENDENT_AMBULATORY_CARE_PROVIDER_SITE_OTHER): Admitting: Vascular Surgery

## 2024-12-07 ENCOUNTER — Encounter (INDEPENDENT_AMBULATORY_CARE_PROVIDER_SITE_OTHER)
